# Patient Record
Sex: Female | Born: 1965 | Race: Black or African American | Hispanic: No | Marital: Single | Smoking: Never smoker
Health system: Southern US, Community
[De-identification: ages and names within clinical notes are randomized; demographics above are authoritative.]

## PROBLEM LIST (undated history)

## (undated) DIAGNOSIS — U071 COVID-19: Secondary | ICD-10-CM

## (undated) DIAGNOSIS — E785 Hyperlipidemia, unspecified: Secondary | ICD-10-CM

## (undated) DIAGNOSIS — C801 Malignant (primary) neoplasm, unspecified: Secondary | ICD-10-CM

## (undated) DIAGNOSIS — G8929 Other chronic pain: Secondary | ICD-10-CM

## (undated) DIAGNOSIS — I10 Essential (primary) hypertension: Secondary | ICD-10-CM

## (undated) DIAGNOSIS — G4733 Obstructive sleep apnea (adult) (pediatric): Secondary | ICD-10-CM

## (undated) HISTORY — PX: TUBAL LIGATION: SHX77

## (undated) HISTORY — PX: SPINE SURGERY: SHX786

## (undated) HISTORY — DX: Other chronic pain: G89.29

## (undated) HISTORY — PX: SHOULDER SURGERY: SHX246

## (undated) HISTORY — DX: Morbid (severe) obesity due to excess calories: E66.01

## (undated) HISTORY — DX: Obstructive sleep apnea (adult) (pediatric): G47.33

## (undated) HISTORY — DX: COVID-19: U07.1

## (undated) HISTORY — PX: ABDOMINAL SURGERY: SHX537

## (undated) HISTORY — DX: Hyperlipidemia, unspecified: E78.5

---

## 1997-11-27 ENCOUNTER — Emergency Department (HOSPITAL_COMMUNITY): Admission: EM | Admit: 1997-11-27 | Discharge: 1997-11-27 | Payer: Self-pay | Admitting: Emergency Medicine

## 1997-12-01 ENCOUNTER — Emergency Department (HOSPITAL_COMMUNITY): Admission: EM | Admit: 1997-12-01 | Discharge: 1997-12-01 | Payer: Self-pay | Admitting: Emergency Medicine

## 1998-07-22 ENCOUNTER — Emergency Department (HOSPITAL_COMMUNITY): Admission: EM | Admit: 1998-07-22 | Discharge: 1998-07-22 | Payer: Self-pay | Admitting: Emergency Medicine

## 1998-11-01 ENCOUNTER — Inpatient Hospital Stay (HOSPITAL_COMMUNITY): Admission: AD | Admit: 1998-11-01 | Discharge: 1998-11-01 | Payer: Self-pay | Admitting: Obstetrics

## 1998-11-05 ENCOUNTER — Inpatient Hospital Stay (HOSPITAL_COMMUNITY): Admission: AD | Admit: 1998-11-05 | Discharge: 1998-11-05 | Payer: Self-pay | Admitting: Obstetrics & Gynecology

## 1998-11-11 ENCOUNTER — Inpatient Hospital Stay (HOSPITAL_COMMUNITY): Admission: AD | Admit: 1998-11-11 | Discharge: 1998-11-12 | Payer: Self-pay | Admitting: Obstetrics

## 1999-02-04 ENCOUNTER — Ambulatory Visit (HOSPITAL_COMMUNITY): Admission: RE | Admit: 1999-02-04 | Discharge: 1999-02-04 | Payer: Self-pay | Admitting: *Deleted

## 1999-03-21 ENCOUNTER — Ambulatory Visit (HOSPITAL_COMMUNITY): Admission: RE | Admit: 1999-03-21 | Discharge: 1999-03-21 | Payer: Self-pay | Admitting: *Deleted

## 1999-05-09 ENCOUNTER — Inpatient Hospital Stay (HOSPITAL_COMMUNITY): Admission: AD | Admit: 1999-05-09 | Discharge: 1999-05-09 | Payer: Self-pay | Admitting: Obstetrics

## 1999-06-06 ENCOUNTER — Inpatient Hospital Stay (HOSPITAL_COMMUNITY): Admission: AD | Admit: 1999-06-06 | Discharge: 1999-06-06 | Payer: Self-pay | Admitting: Obstetrics

## 1999-06-08 ENCOUNTER — Inpatient Hospital Stay (HOSPITAL_COMMUNITY): Admission: AD | Admit: 1999-06-08 | Discharge: 1999-06-08 | Payer: Self-pay | Admitting: Obstetrics

## 1999-06-08 ENCOUNTER — Encounter: Payer: Self-pay | Admitting: Obstetrics

## 1999-06-09 ENCOUNTER — Inpatient Hospital Stay (HOSPITAL_COMMUNITY): Admission: AD | Admit: 1999-06-09 | Discharge: 1999-06-13 | Payer: Self-pay | Admitting: Obstetrics & Gynecology

## 1999-06-14 ENCOUNTER — Inpatient Hospital Stay (HOSPITAL_COMMUNITY): Admission: AD | Admit: 1999-06-14 | Discharge: 1999-06-18 | Payer: Self-pay | Admitting: Obstetrics & Gynecology

## 1999-06-14 ENCOUNTER — Encounter (INDEPENDENT_AMBULATORY_CARE_PROVIDER_SITE_OTHER): Payer: Self-pay

## 1999-06-20 ENCOUNTER — Inpatient Hospital Stay (HOSPITAL_COMMUNITY): Admission: EM | Admit: 1999-06-20 | Discharge: 1999-06-20 | Payer: Self-pay | Admitting: Obstetrics & Gynecology

## 1999-06-22 ENCOUNTER — Inpatient Hospital Stay (HOSPITAL_COMMUNITY): Admission: EM | Admit: 1999-06-22 | Discharge: 1999-06-22 | Payer: Self-pay | Admitting: *Deleted

## 1999-06-24 ENCOUNTER — Inpatient Hospital Stay (HOSPITAL_COMMUNITY): Admission: AD | Admit: 1999-06-24 | Discharge: 1999-06-24 | Payer: Self-pay | Admitting: *Deleted

## 1999-06-27 ENCOUNTER — Inpatient Hospital Stay (HOSPITAL_COMMUNITY): Admission: EM | Admit: 1999-06-27 | Discharge: 1999-06-27 | Payer: Self-pay | Admitting: *Deleted

## 1999-07-08 ENCOUNTER — Inpatient Hospital Stay (HOSPITAL_COMMUNITY): Admission: AD | Admit: 1999-07-08 | Discharge: 1999-07-08 | Payer: Self-pay | Admitting: *Deleted

## 2000-05-11 ENCOUNTER — Emergency Department (HOSPITAL_COMMUNITY): Admission: EM | Admit: 2000-05-11 | Discharge: 2000-05-11 | Payer: Self-pay | Admitting: Emergency Medicine

## 2000-09-02 ENCOUNTER — Emergency Department (HOSPITAL_COMMUNITY): Admission: EM | Admit: 2000-09-02 | Discharge: 2000-09-03 | Payer: Self-pay | Admitting: Emergency Medicine

## 2000-10-19 ENCOUNTER — Encounter: Payer: Self-pay | Admitting: Emergency Medicine

## 2000-10-19 ENCOUNTER — Emergency Department (HOSPITAL_COMMUNITY): Admission: EM | Admit: 2000-10-19 | Discharge: 2000-10-19 | Payer: Self-pay | Admitting: Emergency Medicine

## 2000-11-26 ENCOUNTER — Encounter: Payer: Self-pay | Admitting: Surgery

## 2000-11-26 ENCOUNTER — Ambulatory Visit (HOSPITAL_COMMUNITY): Admission: AD | Admit: 2000-11-26 | Discharge: 2000-11-27 | Payer: Self-pay | Admitting: Surgery

## 2002-01-10 ENCOUNTER — Emergency Department (HOSPITAL_COMMUNITY): Admission: EM | Admit: 2002-01-10 | Discharge: 2002-01-11 | Payer: Self-pay

## 2002-01-12 ENCOUNTER — Emergency Department (HOSPITAL_COMMUNITY): Admission: EM | Admit: 2002-01-12 | Discharge: 2002-01-12 | Payer: Self-pay | Admitting: Emergency Medicine

## 2002-01-14 ENCOUNTER — Emergency Department (HOSPITAL_COMMUNITY): Admission: EM | Admit: 2002-01-14 | Discharge: 2002-01-14 | Payer: Self-pay | Admitting: Emergency Medicine

## 2002-07-06 ENCOUNTER — Emergency Department (HOSPITAL_COMMUNITY): Admission: EM | Admit: 2002-07-06 | Discharge: 2002-07-06 | Payer: Self-pay

## 2002-07-06 ENCOUNTER — Encounter: Payer: Self-pay | Admitting: Emergency Medicine

## 2003-02-06 ENCOUNTER — Other Ambulatory Visit: Admission: RE | Admit: 2003-02-06 | Discharge: 2003-02-06 | Payer: Self-pay | Admitting: Obstetrics and Gynecology

## 2005-08-08 ENCOUNTER — Emergency Department (HOSPITAL_COMMUNITY): Admission: EM | Admit: 2005-08-08 | Discharge: 2005-08-09 | Payer: Self-pay | Admitting: Emergency Medicine

## 2005-08-08 IMAGING — CR DG CHEST 2V
2 series · 2 of 2 positions shown · non-contrast
Comparison: [DATE].

CLINICAL DATA: Syncope.  Headache.  
 CHEST - 2 VIEW:

[view not recorded (1 of 2)]
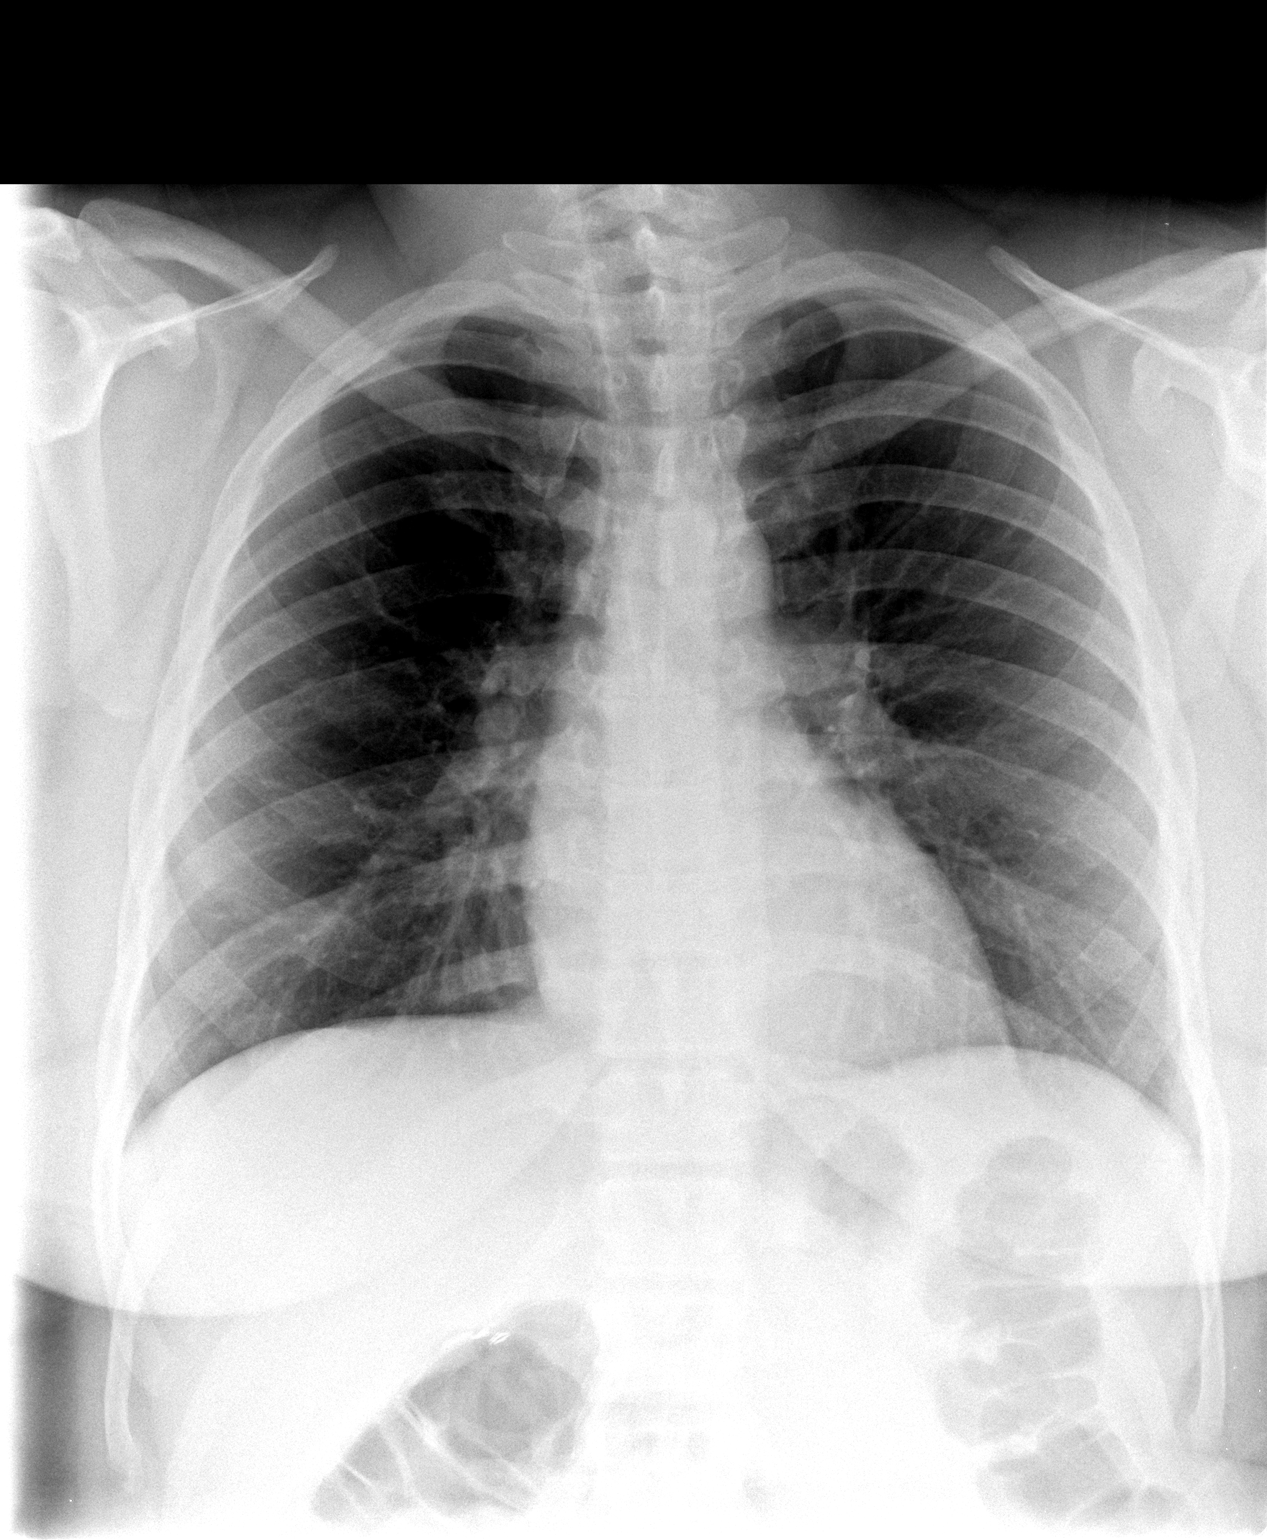

[view not recorded (2 of 2)]
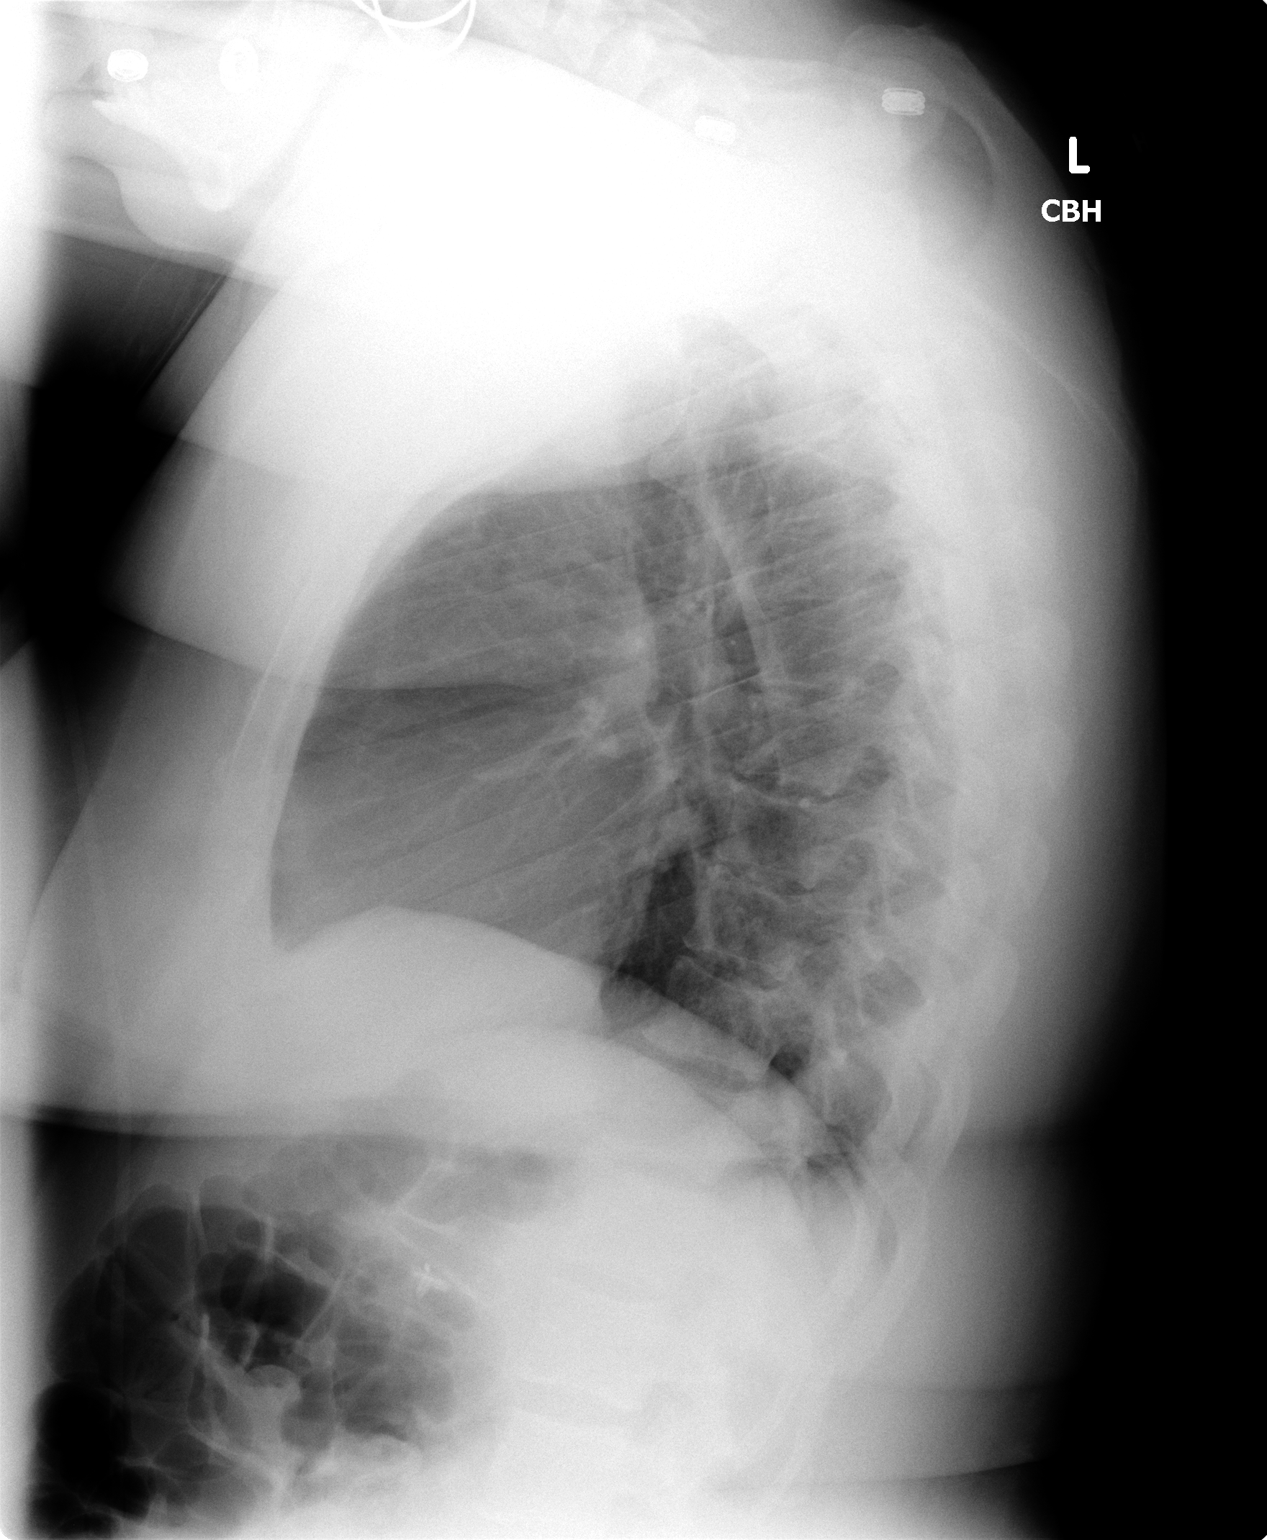

[2 of 2 positions shown; findings below may reference images not displayed]

The heart size and mediastinal contours are within normal limits.  Both lungs are clear.  The visualized skeletal structures are unremarkable.
IMPRESSION: No active cardiopulmonary disease.

## 2006-08-06 ENCOUNTER — Encounter: Admission: RE | Admit: 2006-08-06 | Discharge: 2006-08-06 | Payer: Self-pay | Admitting: Occupational Medicine

## 2006-08-06 IMAGING — CR DG ANKLE COMPLETE 3+V*L*
3 series · 3 of 3 positions shown · non-contrast
Comparison: none

CLINICAL DATA: Left ankle sprain. 
 LEFT ANKLE ? 3 VIEW:

[view not recorded (1 of 3)]
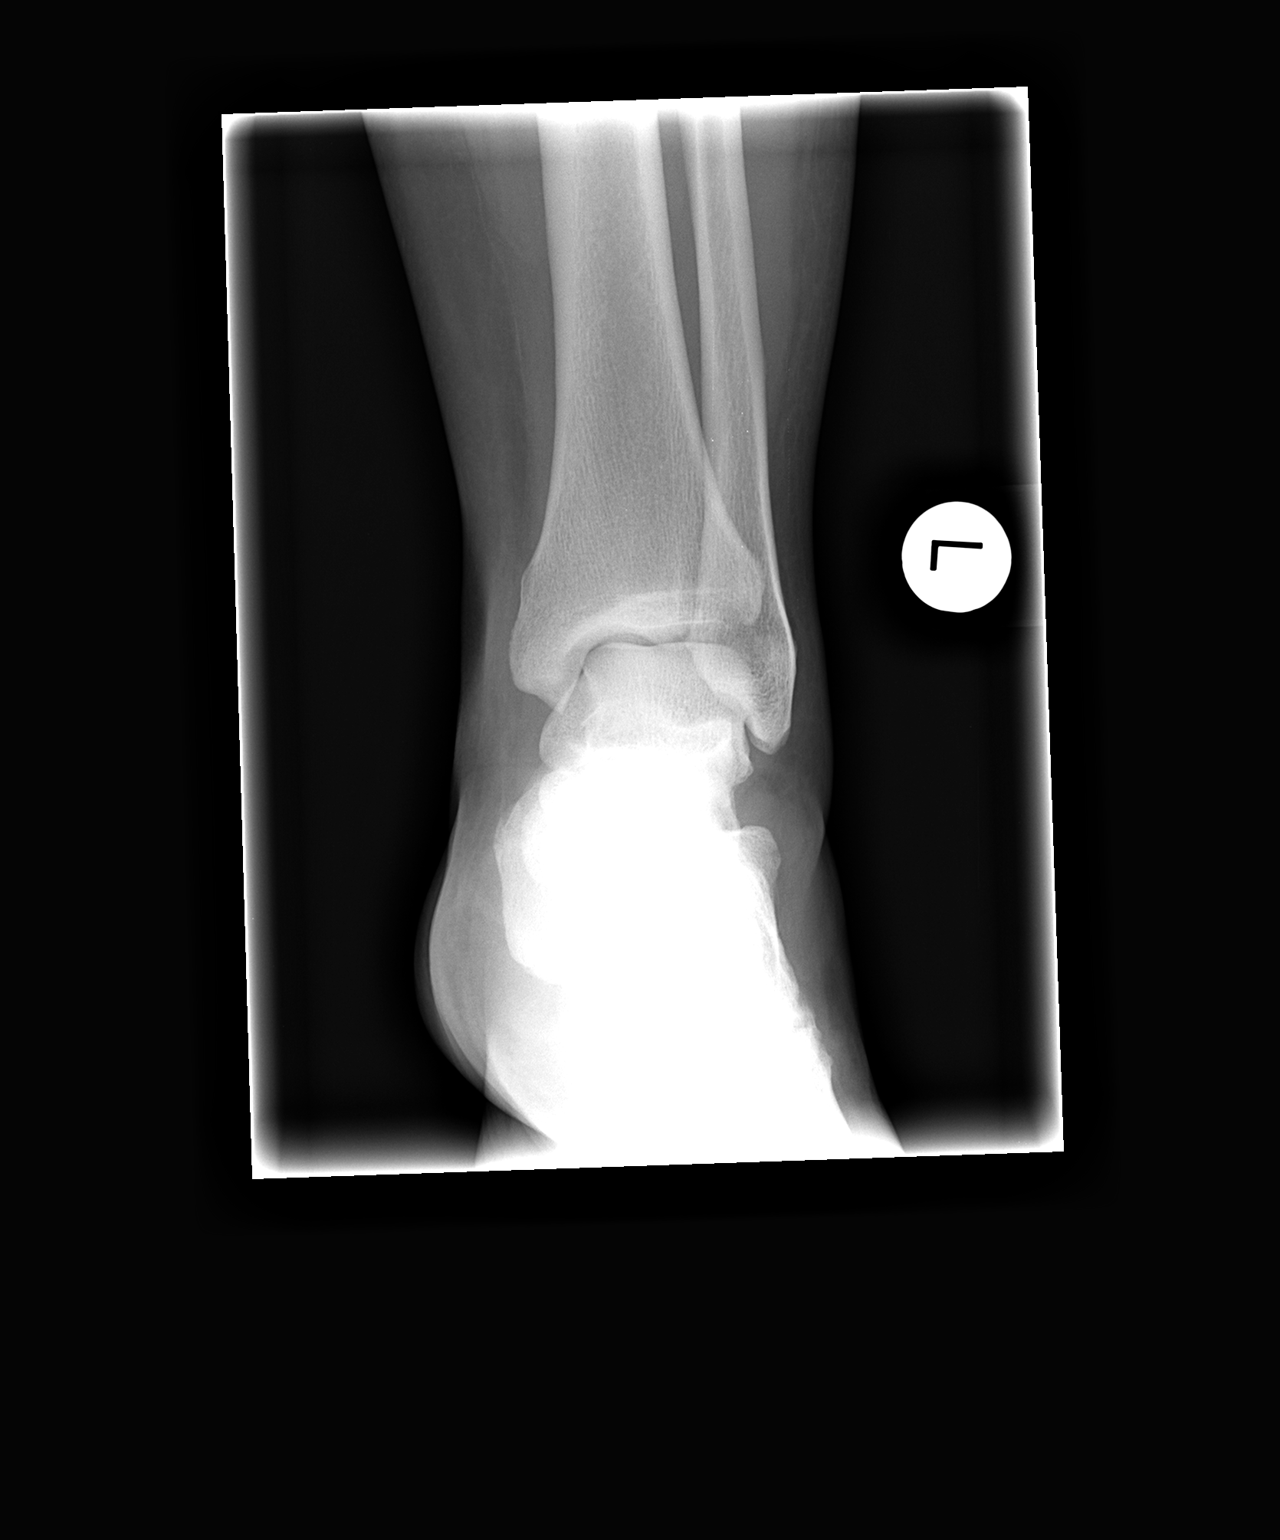

[view not recorded (2 of 3)]
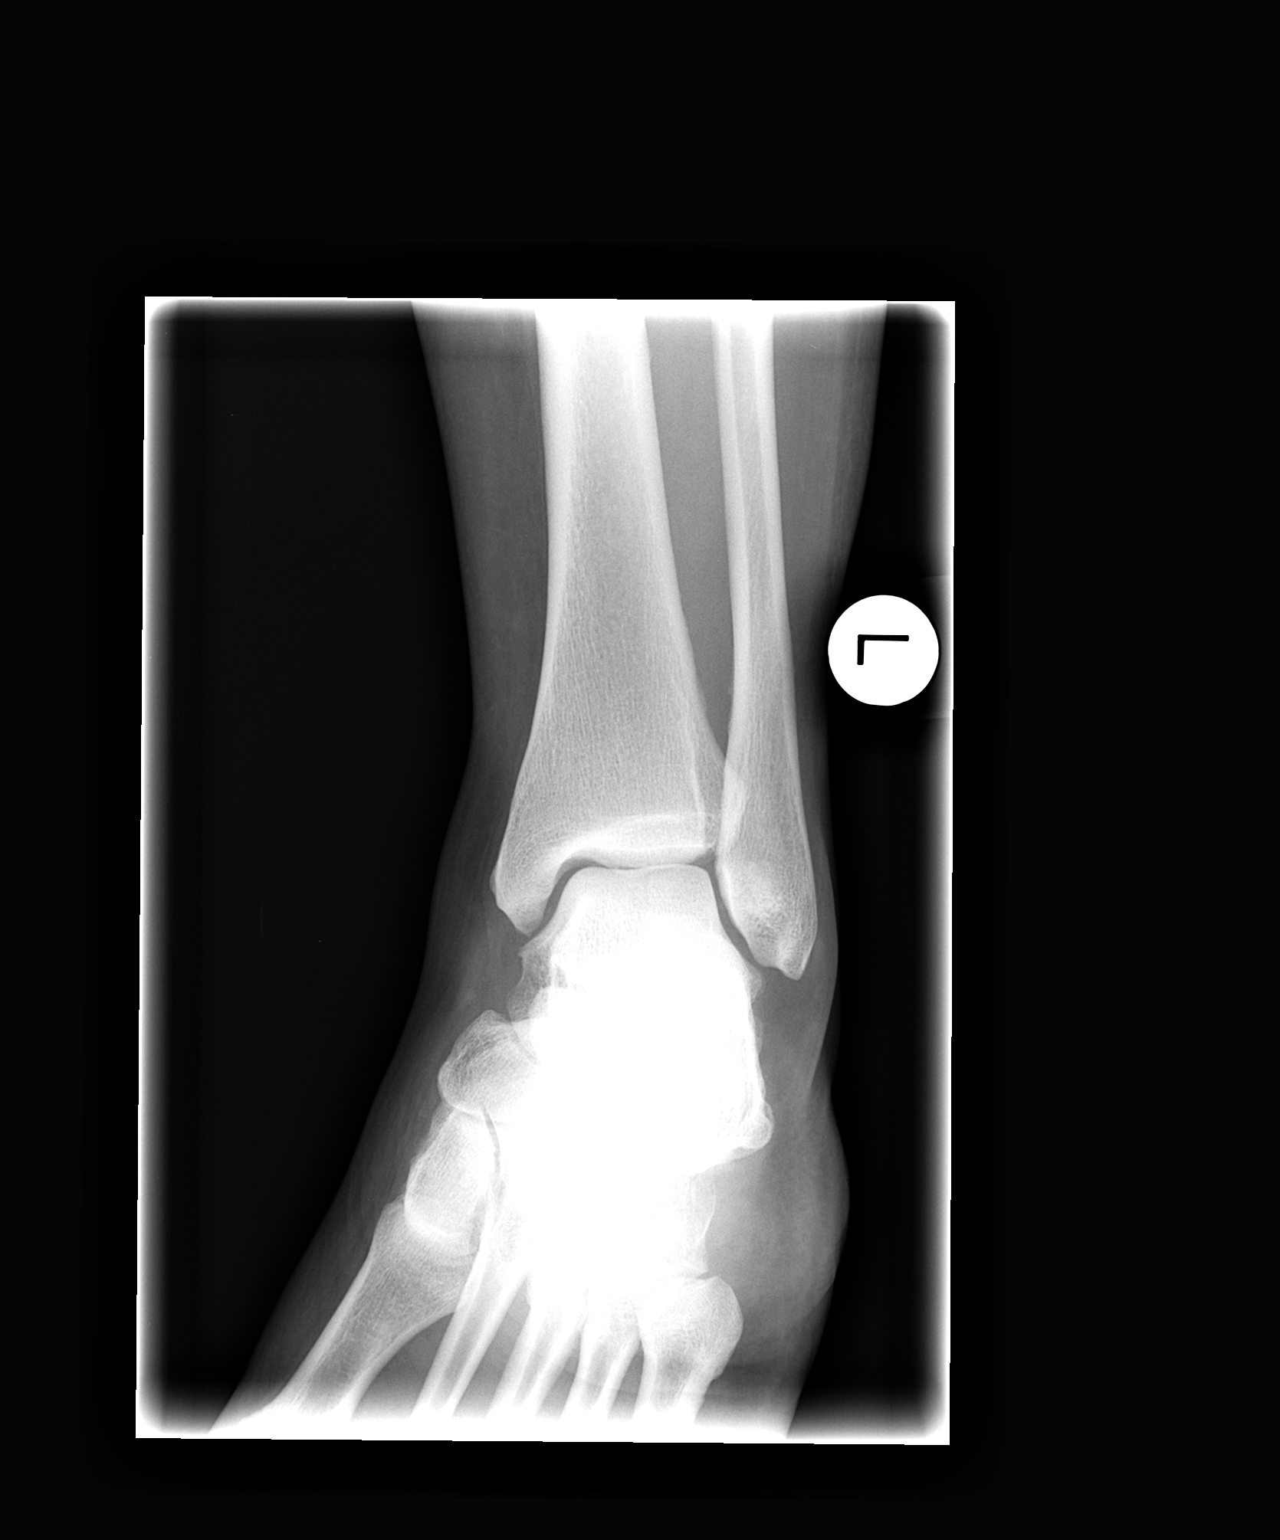

[view not recorded (3 of 3)]
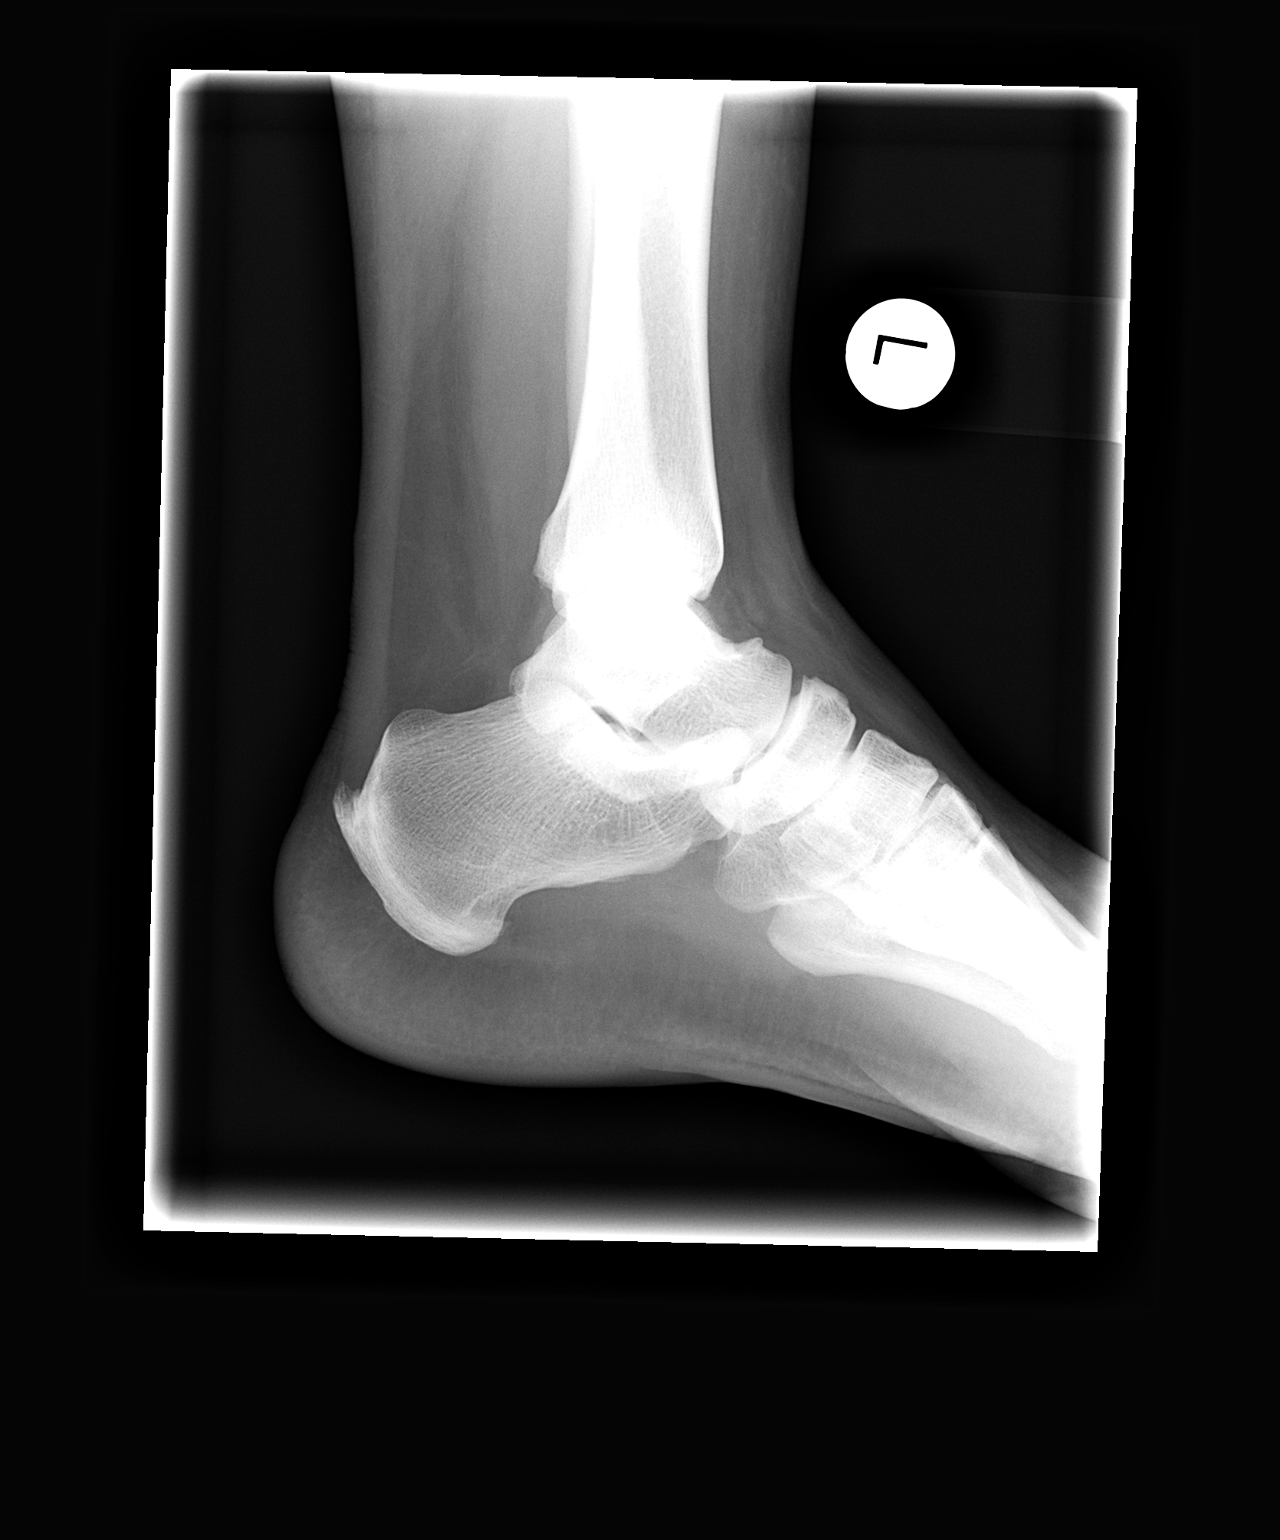

[3 of 3 positions shown; findings below may reference images not displayed]

FINDINGS: There are no fractures or dislocations.  There is mild soft tissue swelling noted.
IMPRESSION: Mild soft tissue swelling.  No acute bony injury.

## 2007-05-12 ENCOUNTER — Emergency Department (HOSPITAL_COMMUNITY): Admission: EM | Admit: 2007-05-12 | Discharge: 2007-05-12 | Payer: Self-pay | Admitting: Emergency Medicine

## 2008-01-30 ENCOUNTER — Encounter: Admission: RE | Admit: 2008-01-30 | Discharge: 2008-01-30 | Payer: Self-pay | Admitting: Internal Medicine

## 2008-01-30 IMAGING — CR DG SHOULDER 2+V*R*
3 series · 3 of 3 positions shown · non-contrast
Comparison: None

CLINICAL DATA: Right shoulder pain.  Injury several days ago.

RIGHT SHOULDER - 2+ VIEW

[view not recorded (1 of 3)]
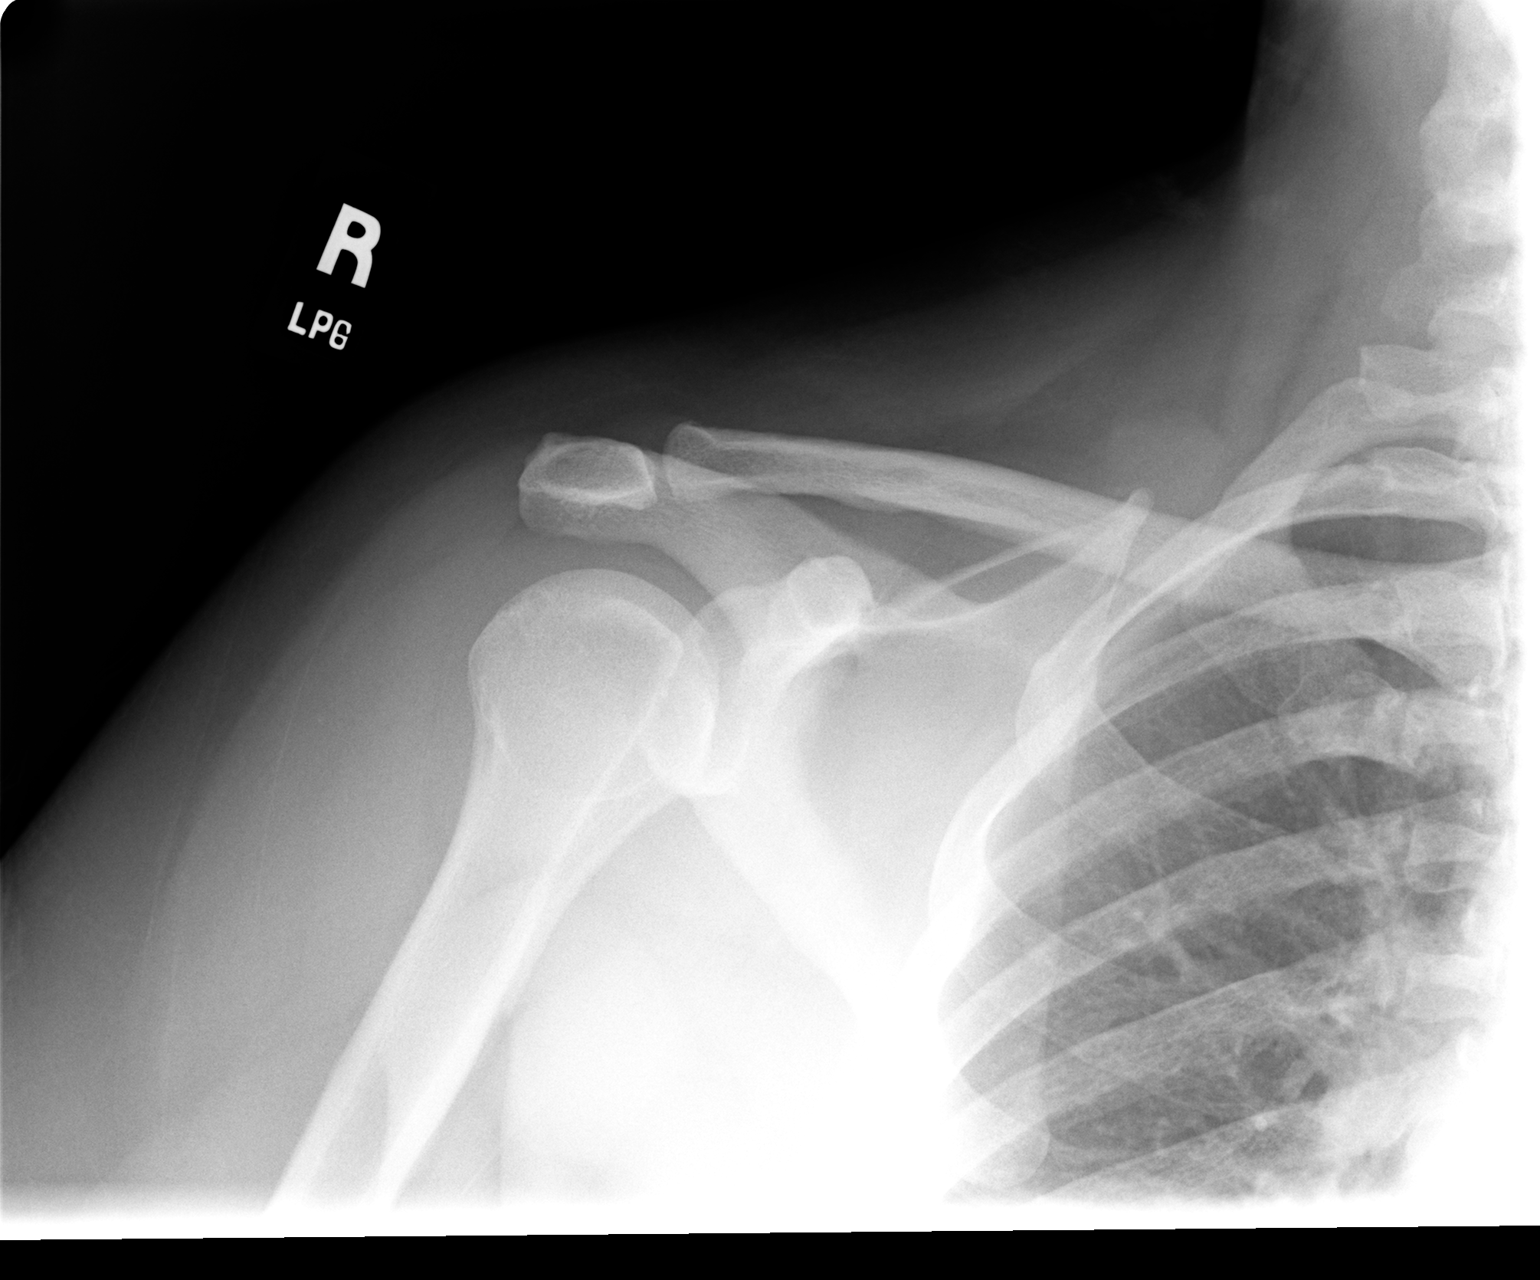

[view not recorded (2 of 3)]
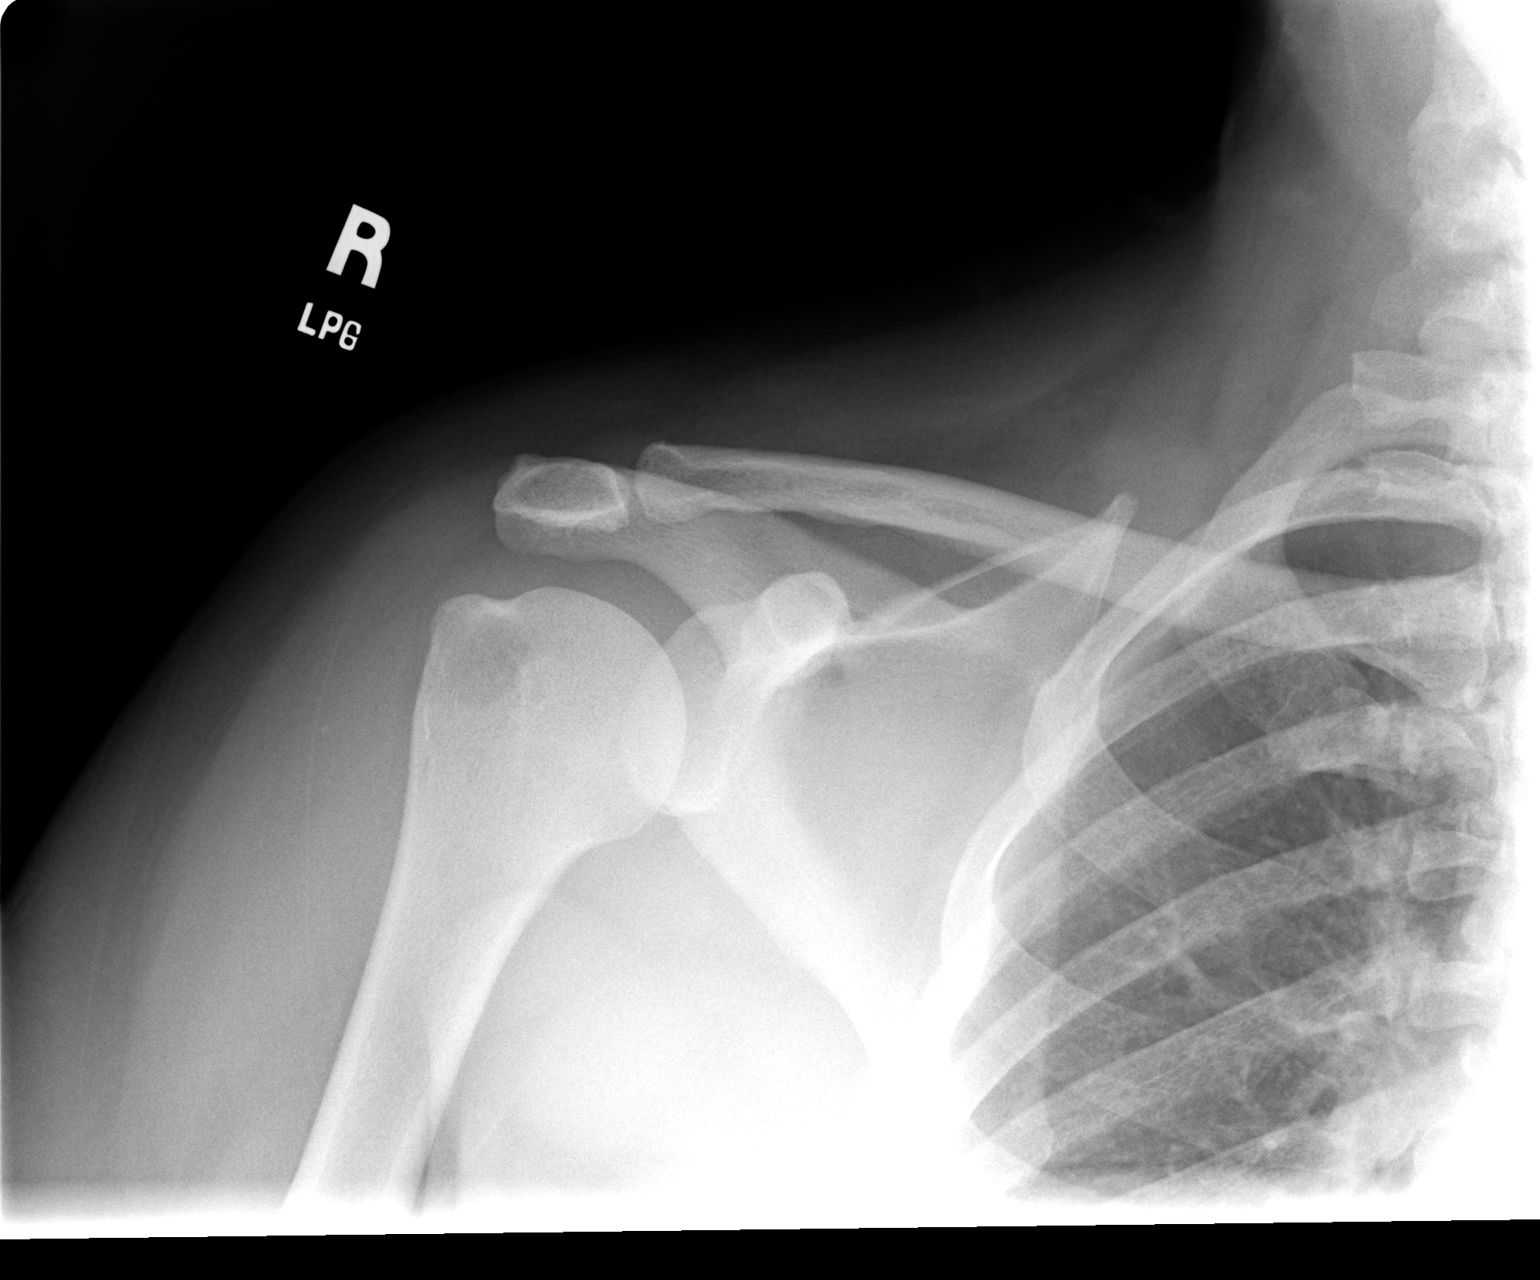

[view not recorded (3 of 3)]
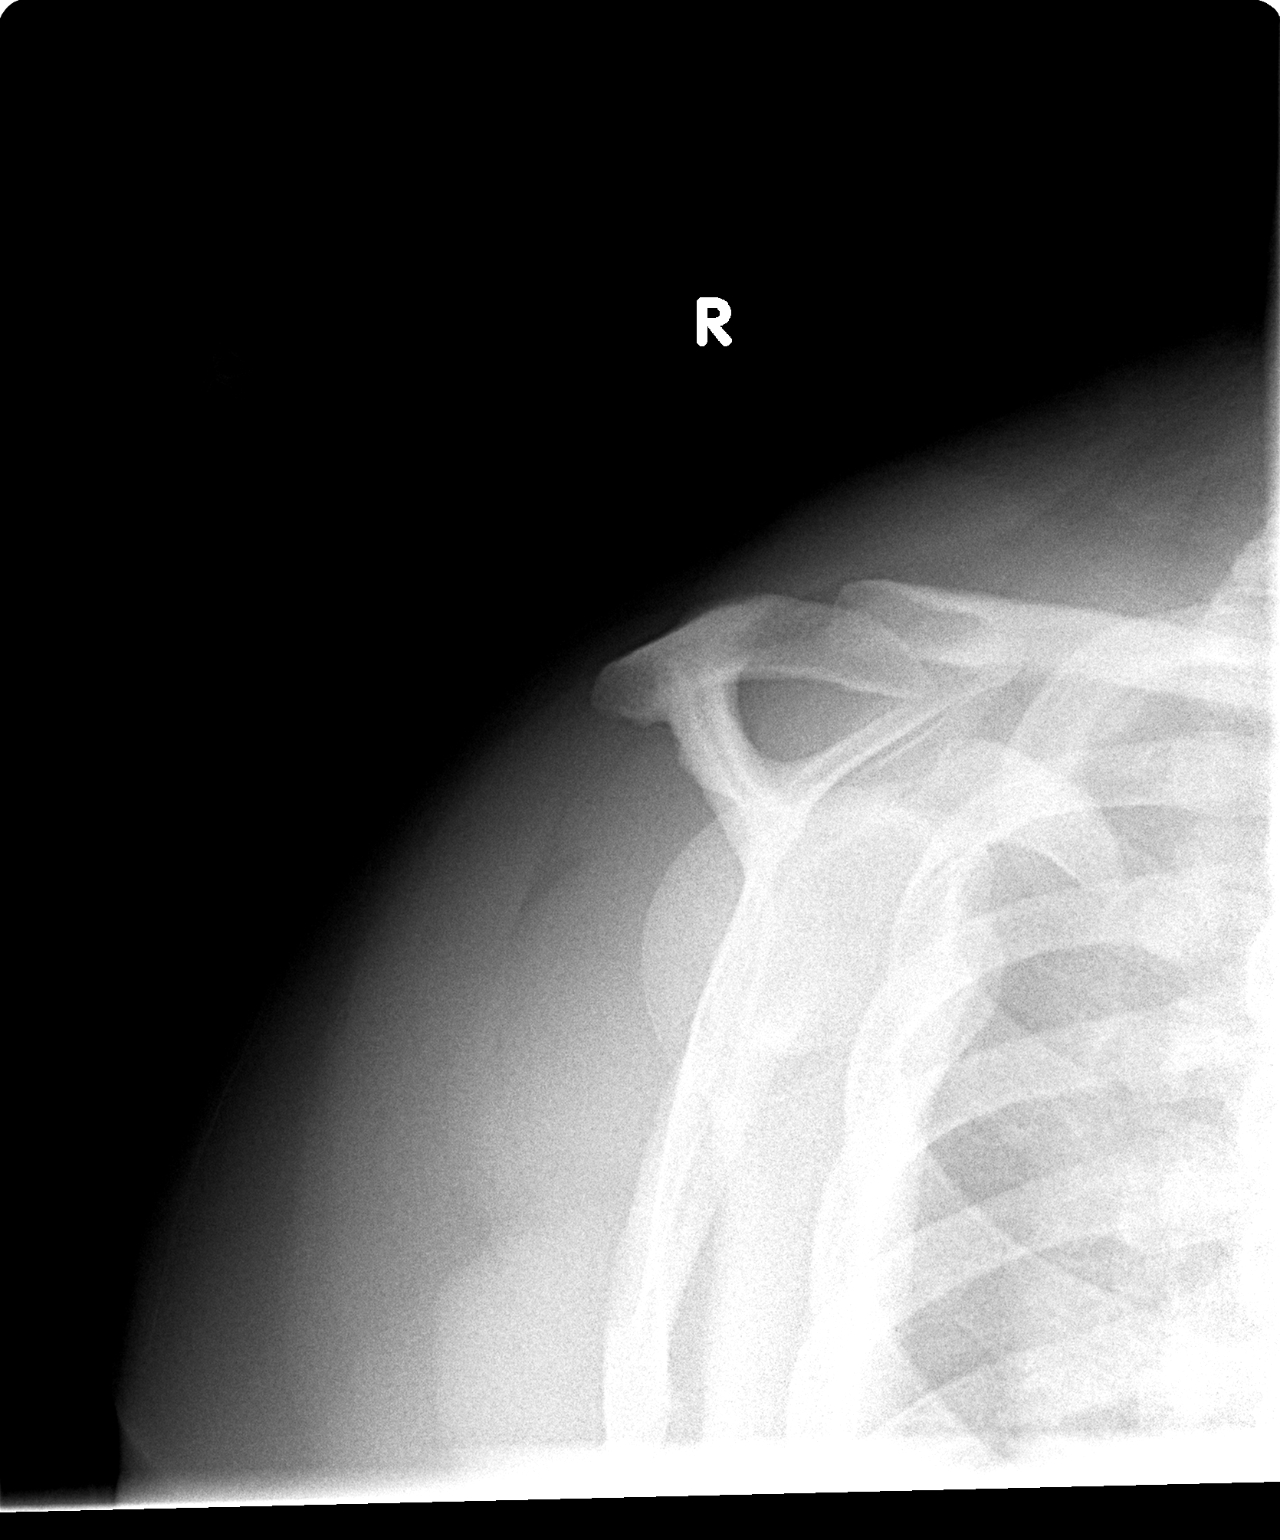

[3 of 3 positions shown; findings below may reference images not displayed]

FINDINGS: No fracture or acute bony findings noted.  No dislocation
or foreign body is evident.
IMPRESSION: 1.  No acute findings.  If pain persists despite conservative
therapy, MRI may be helpful.

## 2008-05-13 ENCOUNTER — Encounter: Admission: RE | Admit: 2008-05-13 | Discharge: 2008-05-13 | Payer: Self-pay | Admitting: Specialist

## 2008-05-13 IMAGING — CR DG CHEST 2V
2 series · 2 of 2 positions shown · non-contrast
Comparison: [DATE]

CLINICAL DATA: Preop for respiratory screening.

CHEST - 2 VIEW

[w chest pa]
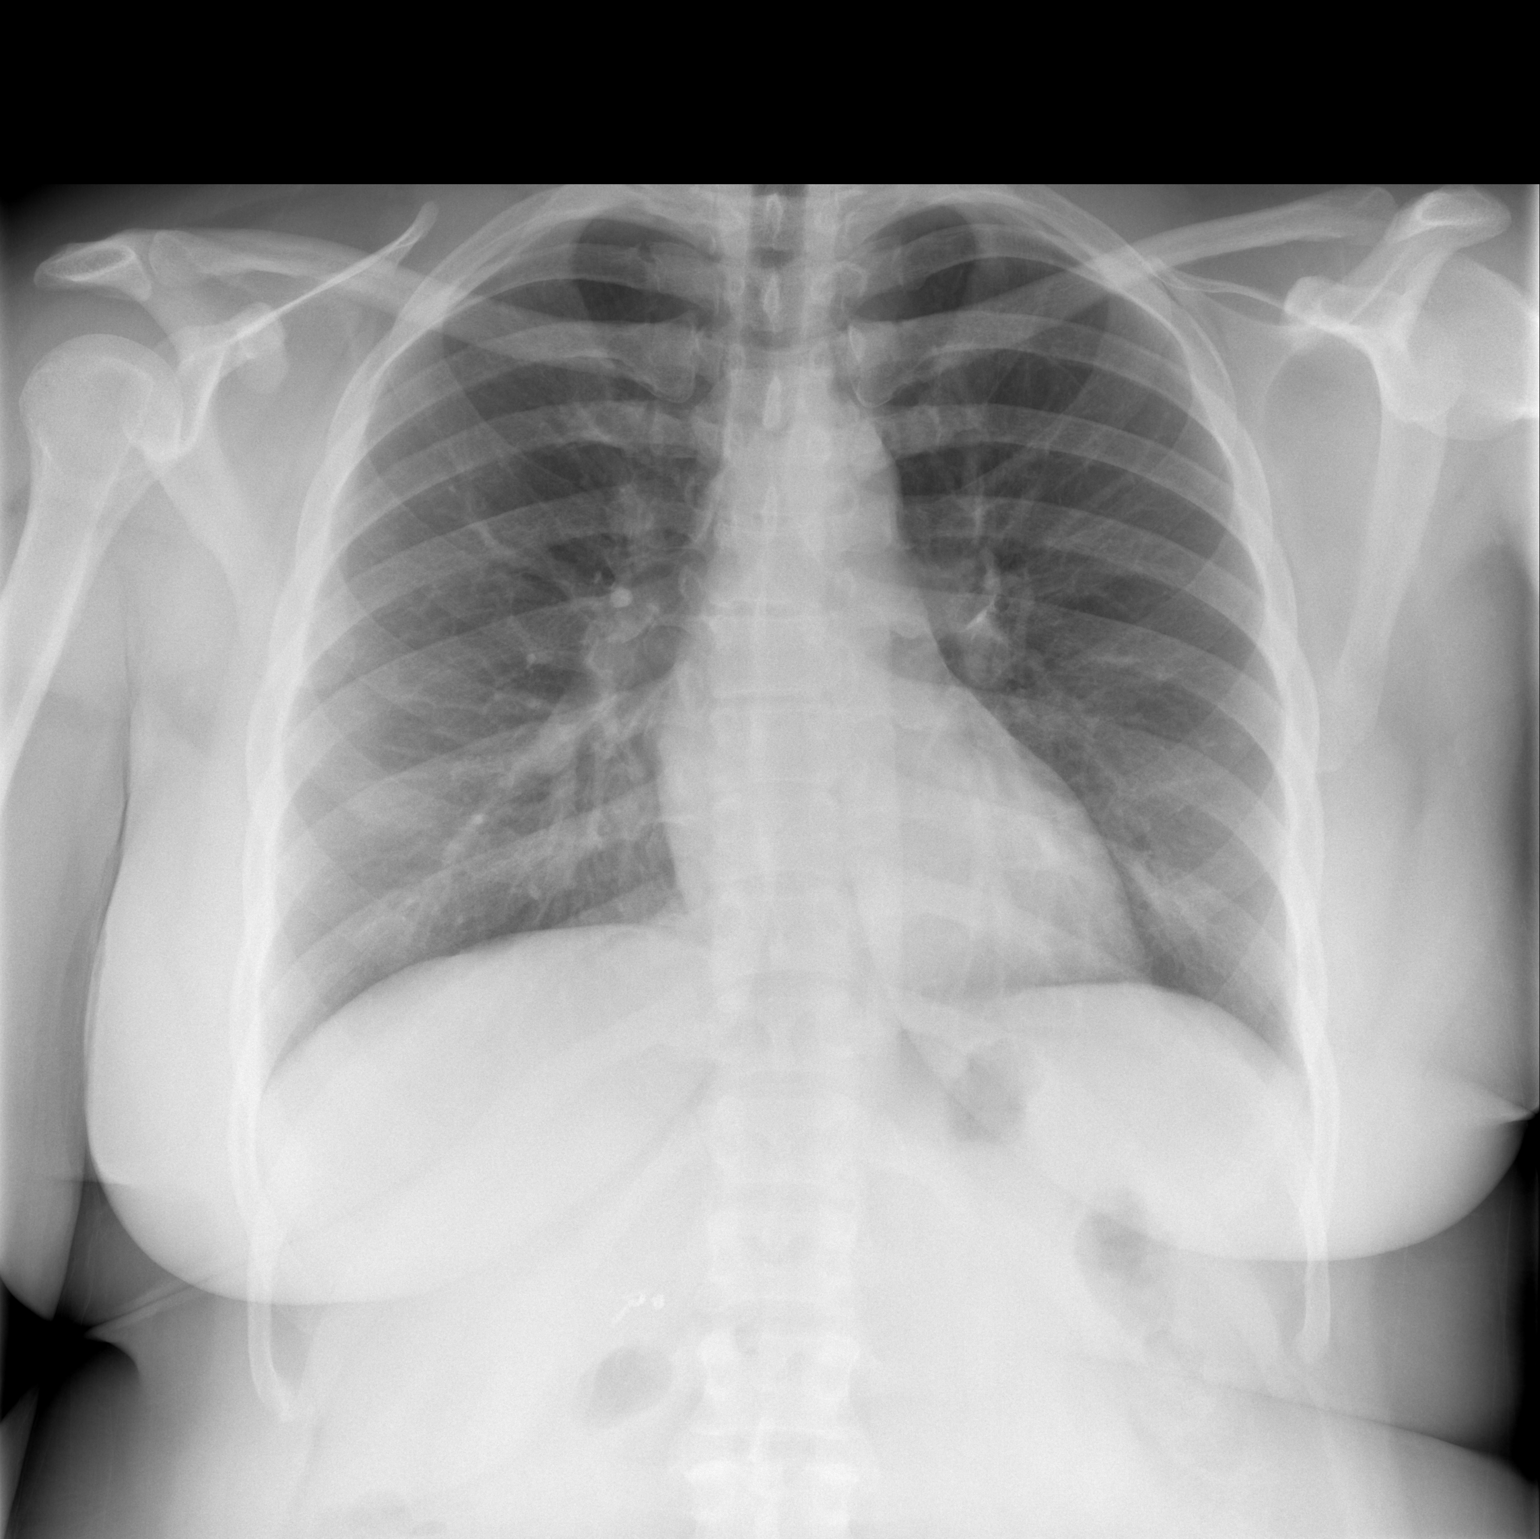

[w chest lat]
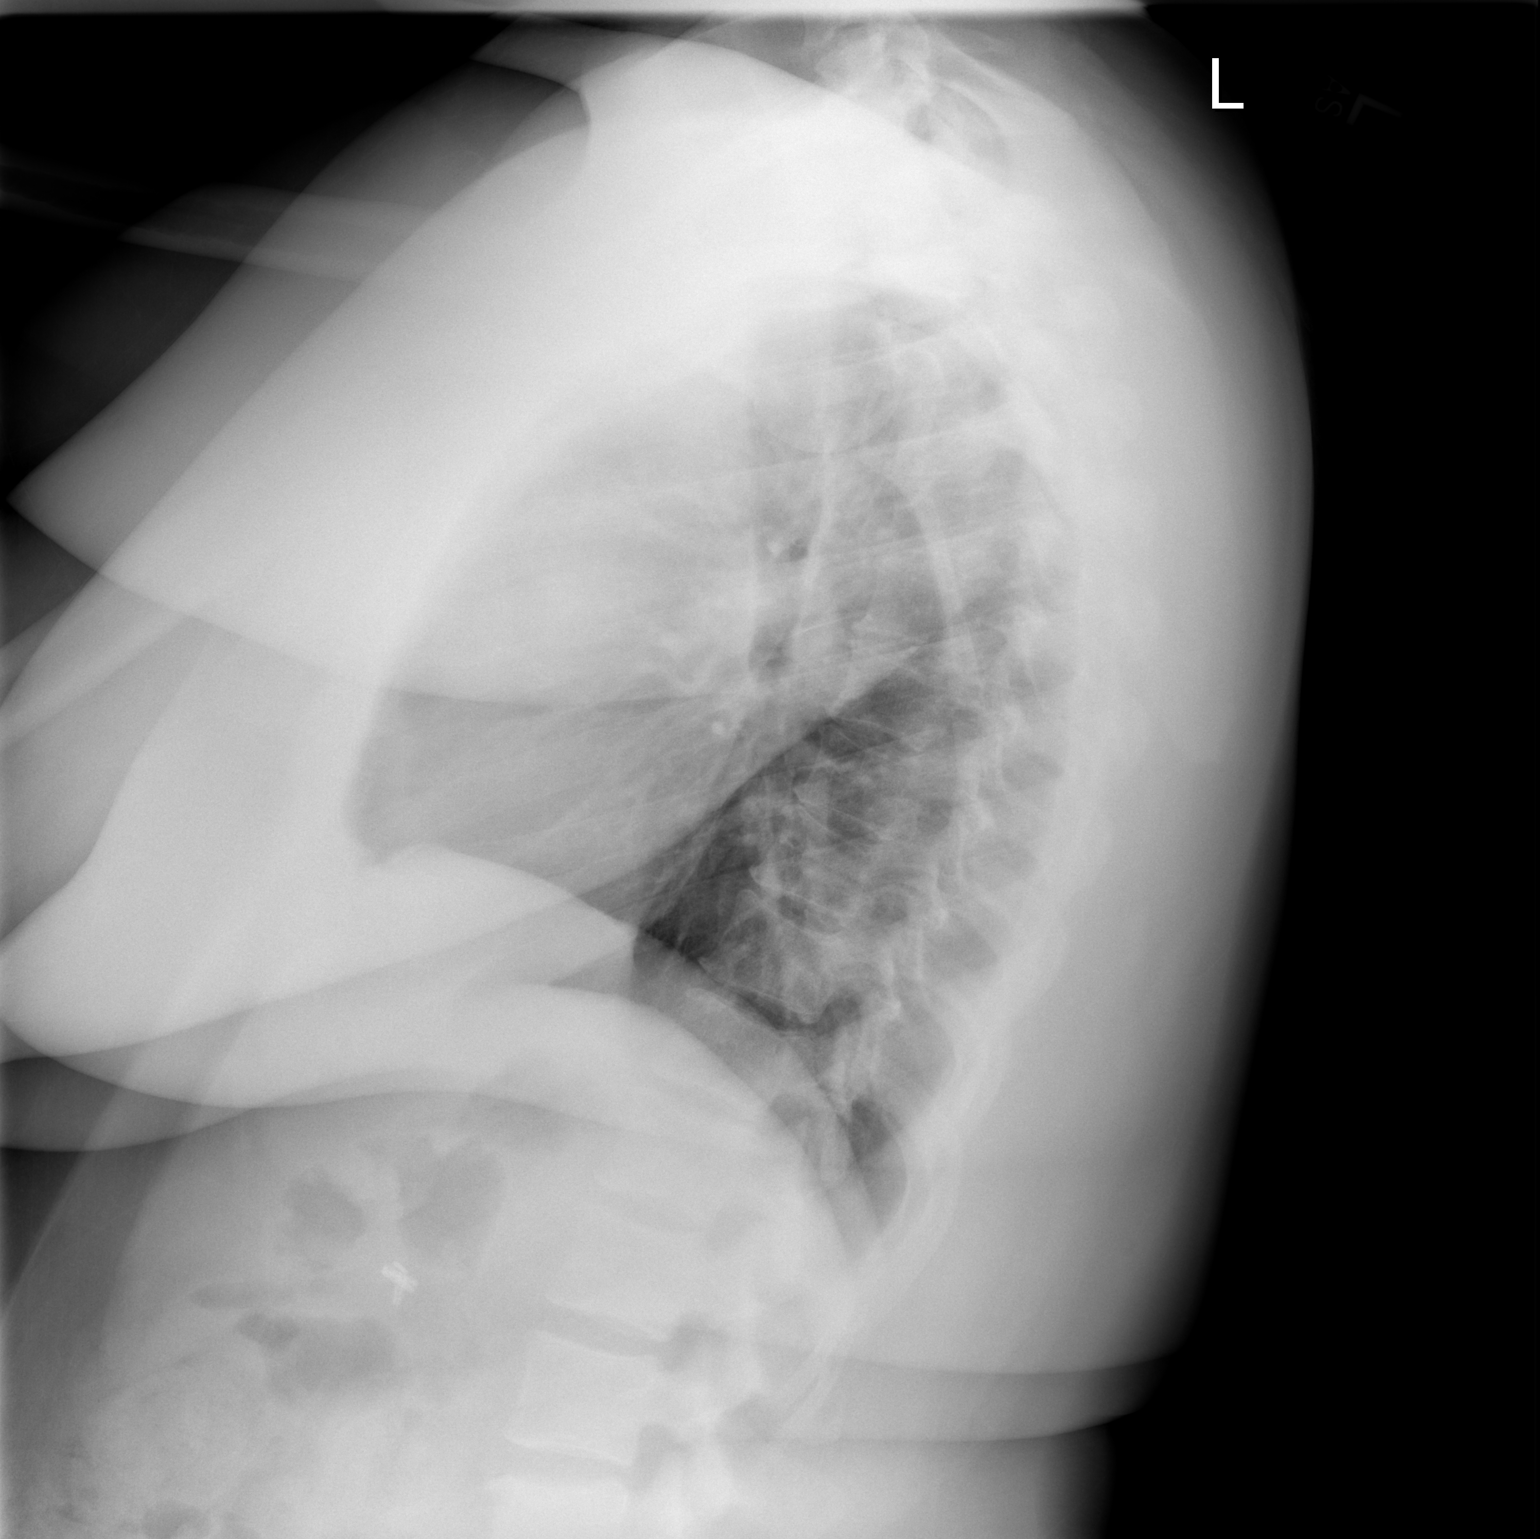

[2 of 2 positions shown; findings below may reference images not displayed]

FINDINGS: The heart size and mediastinal contours are within normal
limits.  Both lungs are clear.  The visualized skeletal structures
are unremarkable.
IMPRESSION: No active cardiopulmonary disease.

## 2008-05-14 ENCOUNTER — Ambulatory Visit (HOSPITAL_BASED_OUTPATIENT_CLINIC_OR_DEPARTMENT_OTHER): Admission: RE | Admit: 2008-05-14 | Discharge: 2008-05-15 | Payer: Self-pay | Admitting: Specialist

## 2010-08-21 ENCOUNTER — Encounter: Payer: Self-pay | Admitting: Internal Medicine

## 2010-10-31 ENCOUNTER — Inpatient Hospital Stay (INDEPENDENT_AMBULATORY_CARE_PROVIDER_SITE_OTHER)
Admission: RE | Admit: 2010-10-31 | Discharge: 2010-10-31 | Disposition: A | Discharge status: Home / Self Care | Payer: Self-pay | Source: Ambulatory Visit | Attending: Emergency Medicine | Admitting: Emergency Medicine

## 2010-10-31 DIAGNOSIS — J019 Acute sinusitis, unspecified: Secondary | ICD-10-CM

## 2010-10-31 LAB — POCT RAPID STREP A (OFFICE): Streptococcus, Group A Screen (Direct): NEGATIVE

## 2010-12-13 NOTE — Op Note (Signed)
Mallory Henderson, Mallory Henderson                 ACCOUNT NO.:  [id number]   MEDICAL RECORD NO.:  [id number]          PATIENT TYPE:  AMB   LOCATION:  [facility]                         FACILITY:  [facility]   PHYSICIAN:  Jene Every, M.D.    DATE OF BIRTH:  1966/05/26   DATE OF PROCEDURE:  DATE OF DISCHARGE:                               OPERATIVE REPORT   PREOPERATIVE DIAGNOSES:  1. Impingement syndrome.  2. Possible rotator cuff tear of the right shoulder.   POSTOPERATIVE DIAGNOSES:  1. Impingement syndrome.  2. Bursitis of the right shoulder.   PROCEDURES PERFORMED:  1. Examination under anesthesia.  2. Right shoulder arthroscopy.  3. Subacromial decompression.  4. Excision of pathologic bursa.   ANESTHESIA:  General.   ASSISTANT:  Roma Schanz, P.A.   INDICATIONS FOR PROCEDURE:  This is a 45 year old with a fractured  shoulder and pain following a work-related injury.  MRI indicating  tendinopathy.  No evidence of a tear.  She had persistent pain that was  refractory to rest, corticosteroid injection which gave her partial  relief.  She was indicated for arthroscopic evaluation of the rotator  cuff, possible repair with decompression and bursectomy.  Risks,  benefits discussed, including , bleeding, infection, injury to vascular  structures, suboptimal range of motion, no change in symptoms, worsening  of symptoms, etc.   DESCRIPTION OF PROCEDURE:  With the patient supine in beach chair  position, after induction of adequate general anesthesia and 2 g Kefzol,  the right shoulder and upper extremity was prepped and draped in the  usual sterile fashion.  We utilized the surgical marker to delineate the  acromion, AC joint, and coracoid by palpation.  She had a fairly obese  arm.  We made a small incision in the posterolateral aspect of the  acromion for the posterior portal in the arm.  In the 70 and 30 position  with gentle traction we advanced the arthroscopic cannula to the  glenohumeral joint in line with coracoid, penetrated in atraumatically  without difficulty.  A small anterior incision was then made for the  anterior portal, closer to the anterolateral aspect of the acromion.  This was advanced under direct visualization beneath the biceps tendon,  penetrating the capsule without difficulty.  We examined and probed the  glenohumeral joint.  There was no evidence of any significant arthritis.  There was normal glenoid and normal humerus.  Normal subscap and biceps  tendon, and biceps tendon attachment.  We examined the rotator cuff.  There was no evidence of any significant tear into the rotator cuff  noted.  It was probed and evaluated.  We introduced the shaver and just  lavaged the glenohumeral joint.  No loose debris was noted.  After full  inspection, in the absence of any significant pathology, we redirected  the arthroscopic camera into the subacromial space and utilized a  lateral portal fashioned through the skin only with the 11 blade,  introduced a cannula, triangulating.  Noted was hypertrophic and  exuberant bursa and synovitis.  We therefore introduced a shaver and  performed a full bursectomy  anteriorly, posteriorly, and on top of the  rotator cuff.  Following the full bursectomy and exposure of the rotator  cuff, the rotator cuff was fully inspected, probed, and palpated.  There  was no evidence of a tear of the rotator cuff noted.  There was ample  subacromial space to see the ligament was intact.  There was no spurring  of the anterolateral aspect of the acromion.  This was with and without  traction, and therefore we felt that no release of the CA ligament would  be required.  Again, full bursectomy was performed, and thorough  inspection and probe palpation of the rotator cuff revealed no evidence  of tear.  Next we then removed all instrumentation after lavaging the  subacromial joint, and closed the portals with 4-0 nylon simple  sutures;  0.25% Marcaine with epinephrine was infiltrated in the joint.  The wound  was dressed sterilely.  She was placed in a sling, extubated without  difficulty and transported to the recovery room in satisfactory  condition.   The patient tolerated the procedure with no complications.      Jene Every, M.D.  Electronically Signed     JB/MEDQ  D:  05/14/2008  T:  05/14/2008  Job:  [id number]

## 2010-12-16 NOTE — Consult Note (Signed)
[place]  Patient:    Mallory Henderson, Mallory Henderson                          MRN: [id number] Proc. Date: 10/19/00 Adm. Date:  10/19/00 Attending:  Sandria Bales. Ezzard Standing, M.D.                          Consultation Report  DATE OF BIRTH:  [birth year].  BRIEF HISTORY:  [name] is 45 year old black female who is a patient of the [facility] at [facility].  She has no identifying primary medical doctor.  Approximately 2 days ago she developed nausea and vomiting with increasing abdominal pain.  She came in some time last night or this morning to the [facility].  A GI cocktail really did not help her pain much.  She denies any known history of peptic ulcer disease, liver disease, gallbladder disease, pancreatic disease, or colon disease.  Her only prior abdominal operations was a laparoscopy about 7 years ago for a questionable tubal pregnancy and then she had a C-section with her last child born 14 months ago.  PAST MEDICAL HISTORY:  She has no allergies.  She is on no current medications.  REVIEW OF SYSTEMS:  Pulmonary:  Does not smoke cigarettes.  There is no history of pneumonia or tuberculosis.  Cardiac:  No evidence of heart disease, chest pain, or hypertension.  Gastrointestinal:  See history of present illness.  Urologic:  No history of kidney stones or kidney infections.  GYN: She is a gravida 6, para 4.  She has children ages, 66, 68, 96 and 35 months. Her last period was October 01, 2000.  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  She does not identify any institution she seems to work at the most.  PHYSICAL EXAMINATION:  VITAL SIGNS:  Her temperature is 98, pulse 104, blood pressure 129/82. Respirations 20.  GENERAL:  She is a well-nourished black female, mildly obese.  Alert and cooperative on physical examination.  HEENT:   Unremarkable.  NECK:  Supple without mass or thyromegaly or  lymphadenopathy.  LUNGS:  Clear to auscultation.  HEART:  Regular rate and rhythm without murmur or rub.  BREASTS:  I did not examine her breasts.  ABDOMEN:  Soft, doughy abdomen with no tenderness, no guarding, no rebound. She has an infraumbilical incision which is well-healed with a scar and she has her Pfannenstiel incision from her C-section.  EXTREMITIES:  Shows good strength in all four extremities.  NEUROLOGIC:  Grossly intact.  LABORATORY DATA:  Urinalysis was unremarkable.  Her white blood count is 5,600.  Hemoglobin 11.5, hematocrit 33.8.  Amylase 102, lipase is 20, ultrasound reveals multiple gallstones with no evidence of acute cholecystitis.  IMPRESSION: 1. Biliary colic which is now resolved.  The patient would be best scheduled    for elective gallbladder surgery.  I have discussed with her the    indications, complications of surgery, including but not limited to    bleeding, infection, bowel duct injury, and possible open surgery.  She    will go to our office today and get scheduled within the next week or    two for surgery. 2. Moderate obesity. DD:  10/19/00 TD:  10/19/00 Job: [id number]

## 2010-12-16 NOTE — Op Note (Signed)
[place]  Patient:    Mallory Henderson, Mallory Henderson                          MRN: [id number] Proc. Date: 11/26/00 Adm. Date:  [id number] Attending:  Andre Lefort                           Operative Report  DATE OF BIRTH:  1966/01/09.  PREOPERATIVE DIAGNOSIS:  Chronic cholecystitis with cholelithiasis.  POSTOPERATIVE DIAGNOSIS:  Chronic cholecystitis with cholelithiasis.  PROCEDURE:  Laparoscopic cholecystectomy with intraoperative cholangiogram.  SURGEON:  Christopher E. Ezzard Standing, M.D.  FIRST ASSISTANT:  Gita Kudo, M.D.  ANESTHESIA:  General endotracheal.  ESTIMATED BLOOD LOSS:  Minimal.  INDICATION FOR PROCEDURE:  Mallory Henderson is a 45 year old black female who is a patient of the [facility]. She presented to the emergency room on October 19, 2000, with symptomatic cholelithiasis.  _____ for laparoscopic cholecystectomy.  DESCRIPTION OF PROCEDURE:  Patient placed in a supine position, given a general endotracheal anesthetic.  She had 1 g of Ancef at the initiation of the procedure, had PAS stockings in place and an oral gastric tube and underwent general endotracheal anesthesia per Cliffton Asters. Ivin Booty, M.D.  Her abdomen was prepped with Betadine soap and solution and sterilely draped. An infraumbilical incision was made with sharp dissection and carried down to the abdominal cavity.  The 0 degree laparoscope was inserted through a 12 mm Hasson trocar, and the Hasson trocar was secured with a 0 Vicryl suture.  Both lobes of the liver were unremarkable.  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  Her stomach was unremarkable.  She had some adhesions to her lower abdominal wall with omentum.  There was no other nodularity or mass.  Three additional trocars were placed, a 10 mm Ethicon  trocar in the subxiphoid location, a 5 mm Ethicon trocar in the right midsubcostal location, and a 5 mm Ethicon trocar in the right lateral subcostal location.  The gallbladder was grasped, rotated cephalad.  Dissection was carried out along the gallbladder-cystic duct junction.  A clip was on the gallbladder side of the cystic duct and intraoperative cholangiogram obtained.  Intraoperative cholangiogram was obtained using a cut-off Taut catheter inserted through a 14-gauge Jelco catheter, and then the cystic duct was cut. The 14-gauge Jelco was placed through the cystic duct and secured with an endoclip.  Half-strength Hypaque solution was then injected under direct fluoroscopy, showing free flow of contrast through the cystic duct, into the common bile duct, into the duodenum.  There was no mass nor obstruction.  This was felt to be a normal intraoperative cholangiogram.  The Taut catheter was then removed, the cystic duct triply endoclipped and divided.  The cystic artery was identified immediately behind it and triply endoclipped and divided.  The gallbladder was then sharply and bluntly dissected from the gallbladder bed.  There was a posterior branch of the gallbladder artery which was identified, and this was doubly clipped.  Prior to complete division of the gallbladder from the gallbladder bed, the gallbladder bed and triangle of Calot were visualized.  There was no bleeding and no bile leak.  The gallbladder was divided from the liver, delivered through the umbilicus intact.  The  patient had at least two or three large, probably 1.2-1.4 cm, stones.  The abdomen was then irrigated out and the umbilical incision closed with a 0 Vicryl suture.  Each port site had been infiltrated with 0.25% Marcaine.  The skin at each site was closed with a 5-0 Vicryl suture, painted with tincture of benzoin and Steri-Strips, and sterilely dressed.  The patient tolerated the procedure well, was  transported to the recovery room in good condition.  Sponge and needle count were correct at the end of the case. DD:  11/26/00 TD:  11/26/00 Job: [id number]

## 2010-12-16 NOTE — Op Note (Signed)
[facility]  Patient:    Mallory Henderson                           MRN: [id number] Proc. Date: 06/15/99 Adm. Date:  [id number] Attending:  Michaelle Copas                           Operative Report  PREOPERATIVE DIAGNOSIS:           1. A 38-week intrauterine pregnancy.                                   2. Mild pregnancy-induced hypertension.                                   3. 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                                   4. Suspicious fetal heart tracing with decreased                                      long-term variability and late decelerations.  POSTOPERATIVE DIAGNOSIS:          1. A 38-week intrauterine pregnancy.                                   2. Mild pregnancy-induced hypertension.                                   3. 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                                   4. Suspicious fetal heart tracing with decreased                                      long-term variability and late decelerations.  OPERATION:                        Primary low transverse cesarean section and modified bilateral Pomeroy tubal ligation via Pfannenstiel.  SURGEON:                          Roseanna Rainbow, M.D.  ANESTHESIA:  Spinal.  COMPLICATIONS:                    None.  ESTIMATED BLOOD LOSS:             800 cc.  FLUIDS:                           300 cc lactated ringers.  Urine output 300 cc  clear urine.  INDICATIONS:                     The patient is multipara at 38 weeks with mild  pregnancy-induced hypertension undergoing induction secondary to elevated anti-U and anti-E antibodies.  Maximal dilatation was 2 cm.  The fetal heart tracing was remarkable for decreased long-term variability and late decelerations.  FINDINGS:                         Female  infant in cephalic presentation. Pediatrics were present at delivery.  Apgars were 88.  Normal uterus, tubes, and ovaries.  DESCRIPTION OF PROCEDURE:         The patient was taken to the operating room where a spinal anesthetic was administered and found to be adequate.  She was then prepped and draped in the normal sterile fashion in dorsosupine position with a  leftward tilt.  A Pfannenstiel skin incision was made with the scalpel and carried through to the underlying layer fascia.  The fascia was nicked at the midline and the incision extended laterally with Mayo scissors.  The superior aspect of the  fascial incision was then grasped with Kocher clamps, elevated and the underlying rectus muscle dissected off bluntly.  Attention was then turned to the inferior aspect of the incision which was manipulated in a similar fashion.  The rectus muscles were separated in the midline and the parietal peritoneum identified, tilted up, and entered sharply with Metzenbaum scissors.  The peritoneal incision was then extended superiorly and inferiorly with good visualization of the bladder.  The bladder blade was inserted, and the vesicouterine peritoneum identified, grasped with pick ups, and entered  sharply with Metzenbaum scissors.  This incision was then  extended laterally and the bladder flap created digitally.  The bladder blade was then reinserted in the lower uterine segment and incised in a transverse fashion with a scalpel.  The uterine incision was then extended laterally with bandage scissors.  The bladder blade was removed, and the infants head was delivered atraumatically.  The nose  and mouth were suctioned with the bulb suction and the cord clamped and cut. The infant was handed off to the awaiting pediatricians.  Cord gases were sent. The placenta was then removed, and the uterus exteriorized and cleared of all clots and debris.  The uterine incision was repaired  with 0 Monocryl in a running locked fashion.  Excellent hemostasis was noted.  Attention was then turned to the fallopian tubes.  The patients left fallopian tube was then identified, grasped with the Babcock clamp approximately 4 cm from the cornual region.  A 3 cm segment of tube was then doubly ligated with free ties of plain gut and excised.  Good hemostasis was noted.  The right fallopian tube was manipulated in a similar fashion.  The uterus was then returned to the abdomen.  The gutters were cleared of all clots.  The fascia was reapproximated with 0 Vicryl in a running fashion.  The skin was closed with staples.  The patient tolerated the procedure well.  Sponge, lap, and needle counts were correct x 2.  The patient as taken to the PACU in stable condition.  DD:  06/20/99 TD:  06/22/99 Job: [id number]

## 2010-12-16 NOTE — Op Note (Signed)
[facility]  Patient:    Mallory Henderson                           MRN: [id number] Proc. Date: 06/15/99 Adm. Date:  [id number] Attending:  Michaelle Copas                           Operative Report  PREOPERATIVE DIAGNOSIS:           A 38-week intrauterine pregnancy, anti-U and anti-E antibodies, rule out isoimmunization, suspicious fetal heart tracing with decreased long-term variability and late decelerations.  POSTOPERATIVE DIAGNOSIS:          A 38-week intrauterine pregnancy, anti-U and anti-E antibodies, rule out isoimmunization, suspicious fetal heart tracing with decreased long-term variability and late decelerations.  OPERATION:                        Primary low transverse cesarean section and bilateral tubal ligation via Pfannenstiel.  SURGEON:                          Roseanna Rainbow, M.D.  ASSISTANT:  ANESTHESIA:  ESTIMATED BLOOD LOSS:  INDICATIONS:  DESCRIPTION OF PROCEDURE: DD:  06/20/99 TD:  06/22/99 Job: [id number]

## 2011-05-02 LAB — CBC
MCHC: 33.2
MCV: 83.6
RBC: 4.09
RDW: 14
WBC: 6.3

## 2011-05-02 LAB — BASIC METABOLIC PANEL
BUN: 7
Calcium: 9
Chloride: 101
GFR calc Af Amer: >60
Potassium: 3.7

## 2011-05-09 ENCOUNTER — Emergency Department (HOSPITAL_COMMUNITY)
Admission: EM | Admit: 2011-05-09 | Discharge: 2011-05-09 | Disposition: A | Discharge status: Home / Self Care | Payer: Self-pay | Attending: Emergency Medicine | Admitting: Emergency Medicine

## 2011-05-09 ENCOUNTER — Emergency Department (HOSPITAL_COMMUNITY): Payer: Self-pay

## 2011-05-09 DIAGNOSIS — R059 Cough, unspecified: Secondary | ICD-10-CM | POA: Insufficient documentation

## 2011-05-09 DIAGNOSIS — R05 Cough: Secondary | ICD-10-CM | POA: Insufficient documentation

## 2011-05-09 DIAGNOSIS — R0602 Shortness of breath: Secondary | ICD-10-CM | POA: Insufficient documentation

## 2011-05-09 DIAGNOSIS — J40 Bronchitis, not specified as acute or chronic: Secondary | ICD-10-CM | POA: Insufficient documentation

## 2011-05-09 IMAGING — CR DG CHEST 2V
2 series · 2 of 2 positions shown · non-contrast
Comparison: [DATE]

CLINICAL DATA: 45 year-old female with shortness of breath.

CHEST - 2 VIEW

[w chest pa]
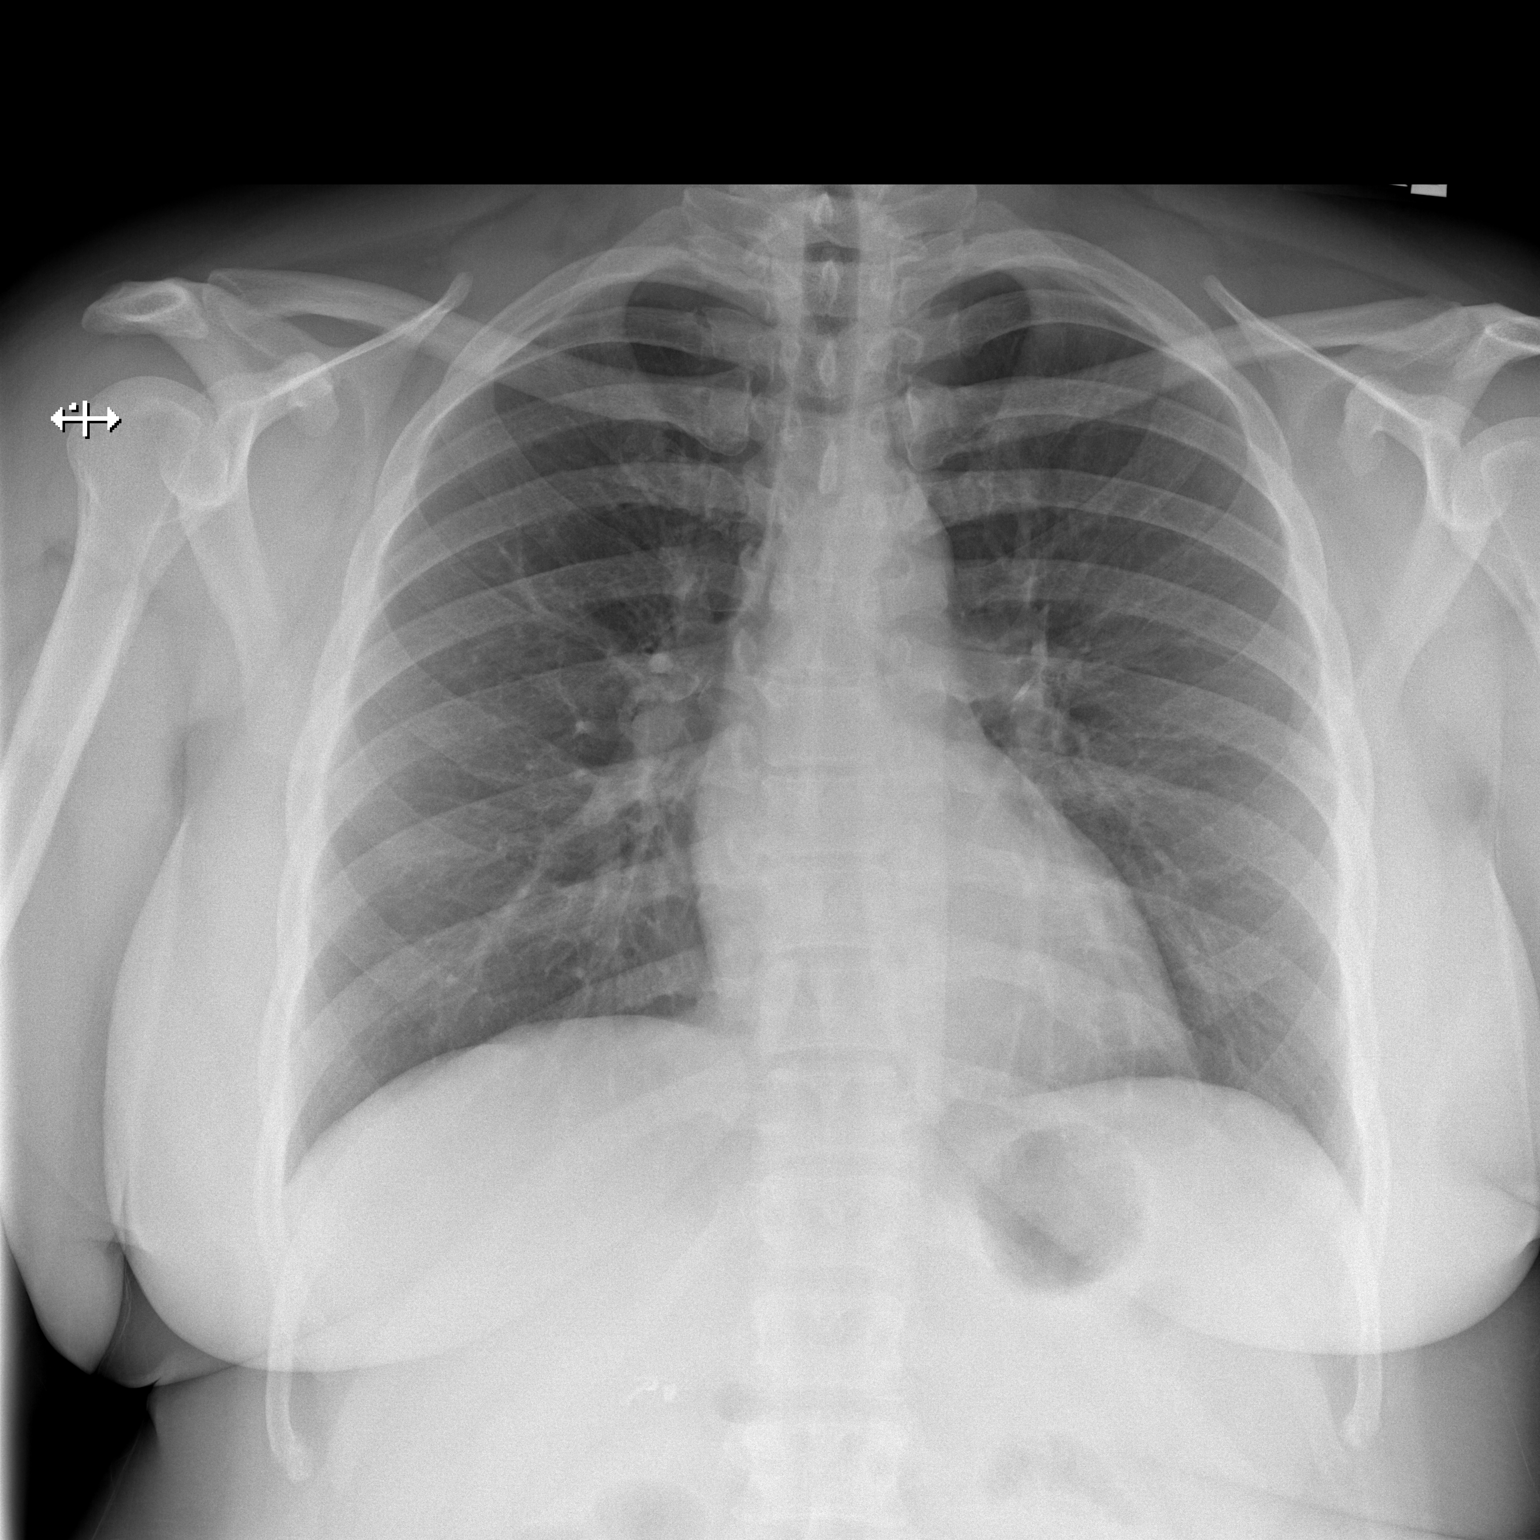

[w chest lat]
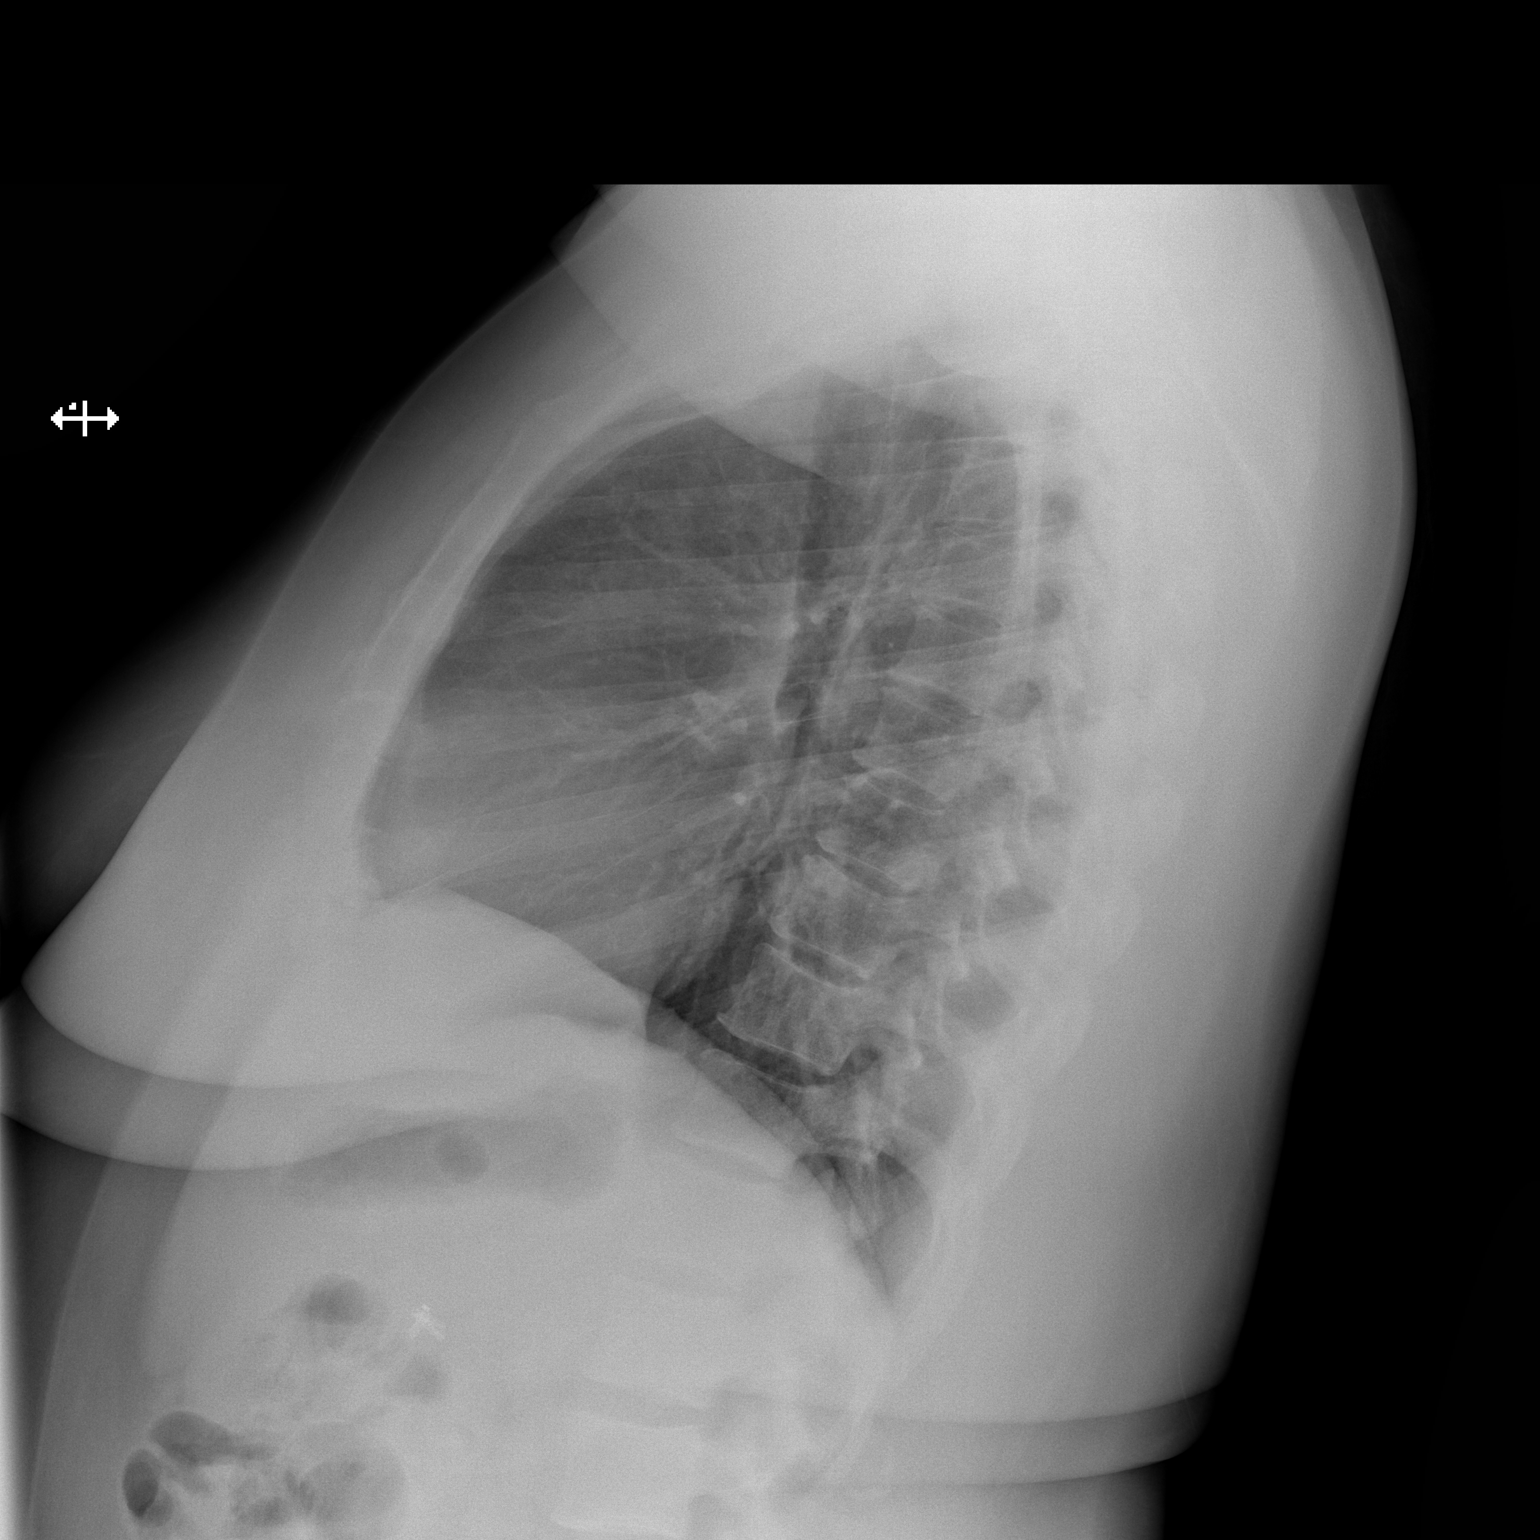

[2 of 2 positions shown; findings below may reference images not displayed]

FINDINGS: The cardiomediastinal silhouette is unremarkable.
The lungs are clear.
There is no evidence of focal airspace disease, pulmonary edema,
pulmonary nodule/mass, pleural effusion, or pneumothorax.
No acute bony abnormalities are identified.
IMPRESSION: No evidence of active cardiopulmonary disease.

## 2011-05-11 LAB — I-STAT 8, (EC8 V) (CONVERTED LAB)
Glucose, Bld: 85
pH, Ven: 7.428 — ABNORMAL HIGH

## 2011-05-11 LAB — PREGNANCY, URINE: Preg Test, Ur: NEGATIVE

## 2011-05-11 LAB — URINALYSIS, ROUTINE W REFLEX MICROSCOPIC
Bilirubin Urine: NEGATIVE
Glucose, UA: NEGATIVE
Ketones, ur: NEGATIVE
Nitrite: NEGATIVE

## 2012-01-27 ENCOUNTER — Encounter (HOSPITAL_COMMUNITY): Payer: Self-pay

## 2012-01-27 ENCOUNTER — Emergency Department (HOSPITAL_COMMUNITY)
Admission: EM | Admit: 2012-01-27 | Discharge: 2012-01-27 | Disposition: A | Discharge status: Home / Self Care | Payer: Self-pay | Attending: Emergency Medicine | Admitting: Emergency Medicine

## 2012-01-27 DIAGNOSIS — R21 Rash and other nonspecific skin eruption: Secondary | ICD-10-CM | POA: Insufficient documentation

## 2012-01-27 DIAGNOSIS — L089 Local infection of the skin and subcutaneous tissue, unspecified: Secondary | ICD-10-CM | POA: Insufficient documentation

## 2012-01-27 HISTORY — DX: Essential (primary) hypertension: I10

## 2012-01-27 MED ORDER — MUPIROCIN 2 % EX OINT: TOPICAL_OINTMENT | Freq: Three times a day (TID) | CUTANEOUS | Status: AC

## 2012-01-27 MED ORDER — SULFAMETHOXAZOLE-TRIMETHOPRIM 800-160 MG PO TABS: ORAL_TABLET | ORAL | Status: DC

## 2012-01-27 NOTE — ED Notes (Signed)
MD at bedside. EDPA Hunt

## 2012-01-27 NOTE — ED Provider Notes (Signed)
Medical screening examination/treatment/procedure(s) were performed by non-physician practitioner and as supervising physician I was immediately available for consultation/collaboration.   Glynn Octave, MD 01/27/12 [phone]

## 2012-01-27 NOTE — Discharge Instructions (Signed)
Clean skin with mild soap and water and then apply topical antibiotic ointment. Take oral antibiotic in complete course. May also apply warm compresses. Monitor for signs or worsening infection to include but not limited to increasing redness, swelling, drainage or pain. Follow up with urgent care in 1 week for recheck of ongoing rash but return to ER for any emergent changing or worsening of symptoms. Establishment with a Primary Care provider is Very important for general health care concerns, minor illness and minor injury.   Skin Infections A skin infection usually develops as a result of disruption of the skin barrier.  CAUSES  A skin infection might occur following:  Trauma or an injury to the skin such as a cut or insect sting.   Inflammation (as in eczema).   Breaks in the skin between the toes (as in athlete's foot).   Swelling (edema).  SYMPTOMS  The legs are the most common site affected. Usually there is:  Redness.   Swelling.   Pain.   There may be red streaks in the area of the infection.  TREATMENT   Minor skin infections may be treated with topical antibiotics, but if the skin infection is severe, hospital care and intravenous (IV) antibiotic treatment may be needed.   Most often skin infections can be treated with oral antibiotic medicine as well as proper rest and elevation of the affected area until the infection improves.   If you are prescribed oral antibiotics, it is important to take them as directed and to take all the pills even if you feel better before you have finished all of the medicine.   You may apply warm compresses to the area for 20-30 minutes 4 times daily.  You might need a tetanus shot now if:  You have no idea when you had the last one.   You have never had a tetanus shot before.   Your wound had dirt in it.  If you need a tetanus shot and you decide not to get one, there is a rare chance of getting tetanus. Sickness from tetanus can be  serious. If you get a tetanus shot, your arm may swell and become red and warm at the shot site. This is common and not a problem. SEEK MEDICAL CARE IF:  The pain and swelling from your infection do not improve within 2 days.  SEEK IMMEDIATE MEDICAL CARE IF:  You develop a fever, chills, or other serious problems.  Document Released: 08/24/2004 Document Revised: 07/06/2011 Document Reviewed: 07/06/2008 [facility] Patient Information 2012 [place], [place].

## 2012-01-27 NOTE — ED Notes (Signed)
Pt reports she was bit by a spider approx 3 days ago, area itching, some redness noted

## 2012-01-27 NOTE — ED Notes (Signed)
Pt reports area to (L) inner ankle that is red, swollen, hot, and itching. States "I think I was bit by a spider 3 days ago." Pt reports "squeezing" area getting some drainage.

## 2012-01-27 NOTE — ED Provider Notes (Signed)
History     CSN: [id number]  Arrival date & time 01/27/12  0906   First MD Initiated Contact with Patient 01/27/12 [phone]      Chief Complaint  Patient presents with  . Insect Bite    (Consider location/radiation/quality/duration/timing/severity/associated sxs/prior treatment) The history is provided by the patient.   Patient presents to emergency department complaining of a three-day history of gradual onset rash over left lower medial leg. Patient states that it initially started as a small patch of "little dots" however states that over the last 3 days a blister formed in the middle of the patch that then busted and drained a clear liquid. Patient states "I figured I got bit by a spider" however she denies actually seeing a spider bite her. She denies any fevers, chills, nausea, vomiting, streaking redness, swelling of ankle or leg. Patient states she's been applying over-the-counter topical creams for itch without any improvement in the rash. She complains of mild ongoing burning itch. No past medical history on file.  No past surgical history on file.  No family history on file.  History  Substance Use Topics  . Smoking status: Not on file  . Smokeless tobacco: Not on file  . Alcohol Use: Not on file    OB History    No data available      Review of Systems  All other systems reviewed and are negative.    Allergies  Review of patient's allergies indicates no known allergies.  Home Medications   Current Outpatient Rx  Name Route Sig Dispense Refill  . ACETAMINOPHEN 500 MG PO TABS Oral Take 1,000 mg by mouth every 6 (six) hours as needed. For pain    . IBUPROFEN 800 MG PO TABS Oral Take 800 mg by mouth every 8 (eight) hours as needed. For pain    . MUPIROCIN 2 % EX OINT Topical Apply topically 3 (three) times daily. 15 g 0  . SULFAMETHOXAZOLE-TRIMETHOPRIM 800-160 MG PO TABS  1 PO BID x 5 days 10 tablet 0    BP 127/58  Pulse 72  Temp 98.4 F (36.9 C) (Oral)   Resp 18  SpO2 97%  Physical Exam  Constitutional: She is oriented to person, place, and time. She appears well-developed and well-nourished. No distress.  HENT:  Head: Normocephalic and atraumatic.  Eyes: Conjunctivae are normal.  Cardiovascular: Normal rate and regular rhythm.   Pulmonary/Chest: Effort normal.  Musculoskeletal: Normal range of motion. She exhibits tenderness. She exhibits no edema.       Right ankle: She exhibits swelling. tenderness.       See skin exam   FROM of ankle and foot without pain (left).   Neurological: She is alert and oriented to person, place, and time.       Normal sensation of entire foot.   Skin: Skin is warm and dry. Rash noted. She is not diaphoretic. No erythema. No pallor.       Quarter size area on left lower medial leg of pin point papules and vesicles with a small central ulceration with faint erythema and mild TTP but no fluctuance, induration or drainage.   Psychiatric: She has a normal mood and affect. Her behavior is normal.    ED Course  Procedures (including critical care time)  Labs Reviewed - No data to display No results found.   1. Rash   2. Skin infection       MDM  Small patch of skin which initially looks like a  contact dermatitis such as poison oak with a small area of superimposed infection. However no signs or symptoms of underlying abscess or deeper infection. There is no induration or fluctuance. She's afebrile. Spoke at length with patient about changing or worsening of symptoms that should prompt immediate return to the emergency department versus following up with an urgent care/primary care provider for recheck of ongoing rash and for general healthcare needs. Patient voices her understanding and is agreeable plan.        [place], [place] 01/27/12 [phone]

## 2012-02-24 ENCOUNTER — Encounter (HOSPITAL_COMMUNITY): Payer: Self-pay | Admitting: Emergency Medicine

## 2012-02-24 ENCOUNTER — Emergency Department (HOSPITAL_COMMUNITY)
Admission: EM | Admit: 2012-02-24 | Discharge: 2012-02-24 | Disposition: A | Discharge status: Home / Self Care | Payer: Self-pay | Attending: Emergency Medicine | Admitting: Emergency Medicine

## 2012-02-24 DIAGNOSIS — R21 Rash and other nonspecific skin eruption: Secondary | ICD-10-CM

## 2012-02-24 NOTE — ED Provider Notes (Signed)
History     CSN: [id number]  Arrival date & time 02/24/12  2956   First MD Initiated Contact with Patient 02/24/12 0825      Chief Complaint  Patient presents with  . Allergic Reaction    (Consider location/radiation/quality/duration/timing/severity/associated sxs/prior treatment) HPI Comments: Mallory Henderson is a 46 y.o. Female who is here for evaluation of a rash. She has itchiness and skin changes of the left and right arm. She had been seen here 01/27/12 for evaluation of a left ankle lesion, possibly consistent with spider bite versus contact dermatitis. She was prescribed Septra, she states that she finished it one week ago; it had been a five-day prescription. The itching, gradually got worse after she took the Septra. She denies nausea, vomiting, weakness, dizziness, shortness of breath, chest pain, or abdominal pain. Her appetite has been good. She worked last night, third shift.   Patient is a 46 y.o. female presenting with allergic reaction. The history is provided by the patient.  Allergic Reaction    Past Medical History  Diagnosis Date  . Hypertension     Past Surgical History  Procedure Date  . Shoulder surgery   . Abdominal surgery   . Cesarean section   . Tubal ligation     History reviewed. No pertinent family history.  History  Substance Use Topics  . Smoking status: Never Smoker   . Smokeless tobacco: Not on file  . Alcohol Use: Yes    OB History    Grav Para Term Preterm Abortions TAB SAB Ect Mult Living                  Review of Systems  All other systems reviewed and are negative.    Allergies  Review of patient's allergies indicates no known allergies.  Home Medications   Current Outpatient Rx  Name Route Sig Dispense Refill  . ACETAMINOPHEN 500 MG PO TABS Oral Take 1,000 mg by mouth every 6 (six) hours as needed. For pain    . IBUPROFEN 800 MG PO TABS Oral Take 800 mg by mouth every 8 (eight) hours as needed. For pain      BP  125/75  Pulse 74  Temp 97.3 F (36.3 C) (Oral)  Resp 20  SpO2 97%  LMP 02/02/2012  Physical Exam  Nursing note and vitals reviewed. Constitutional: She is oriented to person, place, and time. She appears well-developed and well-nourished. She appears distressed (She apppears anxious).  HENT:  Head: Normocephalic and atraumatic.  Eyes: Conjunctivae and EOM are normal. Pupils are equal, round, and reactive to light.  Neck: Normal range of motion and phonation normal. Neck supple.  Cardiovascular: Normal rate.   Pulmonary/Chest: Effort normal. She exhibits no tenderness.  Musculoskeletal: Normal range of motion.  Neurological: She is alert and oriented to person, place, and time. She has normal strength. She exhibits normal muscle tone.  Skin: Skin is warm and dry.       Left upper arm has mild, dry, scaly rash with excoriations; no associated redness, vesicles, or swelling. Right dorsal wrist, and area associated with a tatoo, has a few excoriations without associated swelling, redness, or vesicles. Left medial ankle has a subacute appearing nodule, about 3 mm in diameter, and minimally raised; consistent with a resolving local infection. There is no associated redness, swelling, or streaking.  Psychiatric: Her behavior is normal. Judgment and thought content normal.       Anxious    ED Course  Procedures (including  critical care time)  Labs Reviewed - No data to display No results found.   1. Rash       MDM  Indistinct upper extremity rash, possibly consistent with contact dermatitis, versus heat rash; there are no associated worrisome findings. A secondary problem of a resolving left ankle infection is healing without Complications.    Plan: Home Medications- rec. Claritin and Pepcid; Home Treatments- rest; Recommended follow up- PCP of choice prn        Flint Melter, MD 02/24/12 [phone]

## 2012-02-24 NOTE — ED Notes (Signed)
Patient advised that she was mowing her grass three weeks ago and something bite her on the the inner aspect of the left ankle. She came to [facility] ED on that day. She advised that she was started Septra. She now has a rash on the arms and legs. She complains of severe itching.

## 2012-02-24 NOTE — ED Notes (Signed)
Patient discharged with instructions and teach back method.

## 2012-10-10 ENCOUNTER — Encounter (HOSPITAL_COMMUNITY): Payer: Self-pay | Admitting: Emergency Medicine

## 2012-10-10 ENCOUNTER — Emergency Department (HOSPITAL_COMMUNITY)
Admission: EM | Admit: 2012-10-10 | Discharge: 2012-10-10 | Disposition: A | Discharge status: Home / Self Care | Payer: No Typology Code available for payment source | Attending: Emergency Medicine | Admitting: Emergency Medicine

## 2012-10-10 DIAGNOSIS — S134XXA Sprain of ligaments of cervical spine, initial encounter: Secondary | ICD-10-CM

## 2012-10-10 DIAGNOSIS — M549 Dorsalgia, unspecified: Secondary | ICD-10-CM

## 2012-10-10 DIAGNOSIS — I10 Essential (primary) hypertension: Secondary | ICD-10-CM | POA: Insufficient documentation

## 2012-10-10 DIAGNOSIS — M545 Low back pain, unspecified: Secondary | ICD-10-CM | POA: Insufficient documentation

## 2012-10-10 DIAGNOSIS — Y92410 Unspecified street and highway as the place of occurrence of the external cause: Secondary | ICD-10-CM | POA: Insufficient documentation

## 2012-10-10 DIAGNOSIS — S139XXA Sprain of joints and ligaments of unspecified parts of neck, initial encounter: Secondary | ICD-10-CM | POA: Insufficient documentation

## 2012-10-10 DIAGNOSIS — G8929 Other chronic pain: Secondary | ICD-10-CM | POA: Insufficient documentation

## 2012-10-10 DIAGNOSIS — Y9389 Activity, other specified: Secondary | ICD-10-CM | POA: Insufficient documentation

## 2012-10-10 MED ORDER — IBUPROFEN 600 MG PO TABS: 600.0000 mg | ORAL_TABLET | Freq: Four times a day (QID) | ORAL | Status: DC | PRN

## 2012-10-10 MED ORDER — OXYCODONE-ACETAMINOPHEN 5-325 MG PO TABS: 1.0000 | ORAL_TABLET | Freq: Four times a day (QID) | ORAL | Status: DC | PRN

## 2012-10-10 MED ORDER — CYCLOBENZAPRINE HCL 10 MG PO TABS: 10.0000 mg | ORAL_TABLET | Freq: Two times a day (BID) | ORAL | Status: DC | PRN
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
## 2012-10-14 NOTE — ED Provider Notes (Signed)
Medical screening examination/treatment/procedure(s) were performed by non-physician practitioner and as supervising physician I was immediately available for consultation/collaboration.  Gilda Crease, MD 10/14/12 [phone]

## 2013-02-24 ENCOUNTER — Emergency Department (HOSPITAL_COMMUNITY)
Admission: EM | Admit: 2013-02-24 | Discharge: 2013-02-24 | Disposition: A | Discharge status: Home / Self Care | Payer: Self-pay | Attending: Emergency Medicine | Admitting: Emergency Medicine

## 2013-02-24 ENCOUNTER — Encounter (HOSPITAL_COMMUNITY): Payer: Self-pay | Admitting: Emergency Medicine

## 2013-02-24 DIAGNOSIS — N898 Other specified noninflammatory disorders of vagina: Secondary | ICD-10-CM | POA: Insufficient documentation

## 2013-02-24 DIAGNOSIS — Z91040 Latex allergy status: Secondary | ICD-10-CM | POA: Insufficient documentation

## 2013-02-24 DIAGNOSIS — N949 Unspecified condition associated with female genital organs and menstrual cycle: Secondary | ICD-10-CM | POA: Insufficient documentation

## 2013-02-24 DIAGNOSIS — R102 Pelvic and perineal pain: Secondary | ICD-10-CM

## 2013-02-24 DIAGNOSIS — I10 Essential (primary) hypertension: Secondary | ICD-10-CM | POA: Insufficient documentation

## 2013-02-24 DIAGNOSIS — Z3202 Encounter for pregnancy test, result negative: Secondary | ICD-10-CM | POA: Insufficient documentation

## 2013-02-24 LAB — URINALYSIS, ROUTINE W REFLEX MICROSCOPIC
Bilirubin Urine: NEGATIVE
Ketones, ur: NEGATIVE mg/dL
Protein, ur: NEGATIVE mg/dL
Specific Gravity, Urine: 1.033 — ABNORMAL HIGH (ref 1.005–1.030)
Urobilinogen, UA: 1 mg/dL (ref 0.0–1.0)
pH: 6 (ref 5.0–8.0)

## 2013-02-24 LAB — WET PREP, GENITAL
Clue Cells Wet Prep HPF POC: NONE SEEN
Yeast Wet Prep HPF POC: NONE SEEN

## 2013-02-24 LAB — POCT PREGNANCY, URINE: Preg Test, Ur: NEGATIVE

## 2013-02-24 MED ORDER — CEFTRIAXONE SODIUM 250 MG IJ SOLR
250.0000 mg | Freq: Once | INTRAMUSCULAR | Status: AC
  Administered 2013-02-24: 250 mg via INTRAMUSCULAR
  Filled 2013-02-24: qty 250

## 2013-02-24 MED ORDER — FLUCONAZOLE 150 MG PO TABS
150.0000 mg | ORAL_TABLET | Freq: Every day | ORAL | Status: DC
  Administered 2013-02-24: 150 mg via ORAL
  Filled 2013-02-24: qty 1

## 2013-02-24 MED ORDER — AZITHROMYCIN 250 MG PO TABS
1000.0000 mg | ORAL_TABLET | Freq: Once | ORAL | Status: AC
  Administered 2013-02-24: 1000 mg via ORAL
  Filled 2013-02-24: qty 4

## 2013-02-24 NOTE — ED Notes (Signed)
Pt states she has had dysuria and vaginal pain for past week. States pain radiates from butt to abd after urinating. Also states she has been having white malodorous discharge.

## 2013-02-24 NOTE — ED Provider Notes (Signed)
CSN: [id number]     Arrival date & time 02/24/13  1654 History     First MD Initiated Contact with Patient 02/24/13 1729     Chief Complaint  Patient presents with  . Dysuria  . Vaginal Pain   (Consider location/radiation/quality/duration/timing/severity/associated sxs/prior Treatment) HPI Comments: Patient is a 47 year old female who presents with a 1 week history of vaginal pain. Symptoms started gradually and progressively worsened since the onset. The pain is sharp and intermittent without radiation. She reports associated dysuria and vaginal discharge. No aggravating/alleviating factors. Patient denies any new sexual contacts or other associated symptoms.    Past Medical History  Diagnosis Date  . Hypertension    Past Surgical History  Procedure Laterality Date  . Shoulder surgery    . Abdominal surgery    . Cesarean section    . Tubal ligation     History reviewed. No pertinent family history. History  Substance Use Topics  . Smoking status: Never Smoker   . Smokeless tobacco: Not on file  . Alcohol Use: Yes   OB History   Grav Para Term Preterm Abortions TAB SAB Ect Mult Living                 Review of Systems  Genitourinary: Positive for dysuria and vaginal discharge.  All other systems reviewed and are negative.    Allergies  Latex  Home Medications   Current Outpatient Rx  Name  Route  Sig  Dispense  Refill  . HYDROcodone-acetaminophen (NORCO/VICODIN) 5-325 MG per tablet   Oral   Take 1 tablet by mouth every 6 (six) hours as needed for pain.         Marland Kitchen oxyCODONE-acetaminophen (PERCOCET/ROXICET) 5-325 MG per tablet   Oral   Take 1 tablet by mouth every 6 (six) hours as needed for pain.   12 tablet   0   . Prenatal Vit-Fe Fumarate-FA (PRENATAL MULTIVITAMIN) TABS   Oral   Take 1 tablet by mouth daily at 12 noon.         Marland Kitchen ibuprofen (ADVIL,MOTRIN) 600 MG tablet   Oral   Take 1 tablet (600 mg total) by mouth every 6 (six) hours as needed for  pain.   30 tablet   0    BP 145/97  Pulse 82  Temp(Src) 98.1 F (36.7 C) (Oral)  Resp 14  Ht 5\' 2"  (1.575 m)  Wt 226 lb (102.513 kg)  BMI 41.33 kg/m2  SpO2 92% Physical Exam  Nursing note and vitals reviewed. Constitutional: She is oriented to person, place, and time. She appears well-developed and well-nourished. No distress.  HENT:  Head: Normocephalic and atraumatic.  Eyes: Conjunctivae and EOM are normal.  Neck: Normal range of motion.  Cardiovascular: Normal rate and regular rhythm.  Exam reveals no gallop and no friction rub.   No murmur heard. Pulmonary/Chest: Effort normal and breath sounds normal. She has no wheezes. She has no rales. She exhibits no tenderness.  Abdominal: Soft. She exhibits no distension. There is no tenderness. There is no rebound and no guarding.  Genitourinary:  External genitalia normal. Copious thick, white vaginal discharge in vagina. Cervical os closed. No CMT. No tenderness to palpation or abnormal masses palpated on bimanual exam.   Musculoskeletal: Normal range of motion.  Neurological: She is alert and oriented to person, place, and time. Coordination normal.  Speech is goal-oriented. Moves limbs without ataxia.   Skin: Skin is warm and dry.  Psychiatric: She has a  normal mood and affect. Her behavior is normal.    ED Course   Procedures (including critical care time)  Labs Reviewed  WET PREP, GENITAL - Abnormal; Notable for the following:    WBC, Wet Prep HPF POC MODERATE (*)    All other components within normal limits  URINALYSIS, ROUTINE W REFLEX MICROSCOPIC - Abnormal; Notable for the following:    APPearance CLOUDY (*)    Specific Gravity, Urine 1.033 (*)    All other components within normal limits  URINE CULTURE  GC/CHLAMYDIA PROBE AMP  POCT PREGNANCY, URINE   No results found.  1. Vaginal pain   2. Vaginal discharge     MDM  6:42 PM Urinalysis and wet prep pending. Vitals stable and patient afebrile.   7:58  PM Urinalysis unremarkable. Wet prep shows moderate WBC. Patient will be treated for GC/chlamydia prophylactically. Patient will have diflucan as requested. Vitals stable and patient afebrile. Patient will be discharged without further evaluation.   Emilia Beck, PA-C 02/24/13 2008

## 2013-02-24 NOTE — Progress Notes (Signed)
Patient confirms that she does not have health insurance or pcp.  [facility] provided patient with list of pcps who accept self pay patients, discount pahramcies such as [employer], [employer] and [facility], list of community financial resources such as [employer], information about [employer] Act and Medicaid for insurance.  Patient thankful for resources.  No further needs at this time.

## 2013-02-25 LAB — URINE CULTURE
Colony Count: NO GROWTH
Culture: NO GROWTH

## 2013-02-25 LAB — GC/CHLAMYDIA PROBE AMP
CT Probe RNA: NEGATIVE
GC Probe RNA: NEGATIVE

## 2013-02-26 NOTE — ED Provider Notes (Signed)
Medical screening examination/treatment/procedure(s) were performed by non-physician practitioner and as supervising physician I was immediately available for consultation/collaboration.   Isaac Lacson M Giavanni Zeitlin, DO 02/26/13 1710 

## 2013-05-07 ENCOUNTER — Emergency Department (HOSPITAL_COMMUNITY)
Admission: EM | Admit: 2013-05-07 | Discharge: 2013-05-08 | Disposition: A | Discharge status: Home / Self Care | Payer: Self-pay | Attending: Emergency Medicine | Admitting: Emergency Medicine

## 2013-05-07 ENCOUNTER — Emergency Department (HOSPITAL_COMMUNITY): Payer: Self-pay

## 2013-05-07 ENCOUNTER — Encounter (HOSPITAL_COMMUNITY): Payer: Self-pay | Admitting: Emergency Medicine

## 2013-05-07 DIAGNOSIS — I10 Essential (primary) hypertension: Secondary | ICD-10-CM | POA: Insufficient documentation

## 2013-05-07 DIAGNOSIS — R05 Cough: Secondary | ICD-10-CM | POA: Insufficient documentation

## 2013-05-07 DIAGNOSIS — R0789 Other chest pain: Secondary | ICD-10-CM | POA: Insufficient documentation

## 2013-05-07 DIAGNOSIS — R059 Cough, unspecified: Secondary | ICD-10-CM | POA: Insufficient documentation

## 2013-05-07 DIAGNOSIS — R0602 Shortness of breath: Secondary | ICD-10-CM | POA: Insufficient documentation

## 2013-05-07 LAB — CBC
HCT: 34.5 % — ABNORMAL LOW (ref 36.0–46.0)
Hemoglobin: 11.7 g/dL — ABNORMAL LOW (ref 12.0–15.0)
MCH: 27.9 pg (ref 26.0–34.0)
MCHC: 33.9 g/dL (ref 30.0–36.0)
MCV: 82.3 fL (ref 78.0–100.0)
RBC: 4.19 MIL/uL (ref 3.87–5.11)
RDW: 13.5 % (ref 11.5–15.5)

## 2013-05-07 LAB — BASIC METABOLIC PANEL
BUN: 12 mg/dL (ref 6–23)
CO2: 24 mEq/L (ref 19–32)
Calcium: 9.2 mg/dL (ref 8.4–10.5)
Glucose, Bld: 78 mg/dL (ref 70–99)
Potassium: 3.8 mEq/L (ref 3.5–5.1)
Sodium: 137 mEq/L (ref 135–145)

## 2013-05-07 LAB — POCT I-STAT TROPONIN I

## 2013-05-07 LAB — PRO B NATRIURETIC PEPTIDE: Pro B Natriuretic peptide (BNP): 5.5 pg/mL (ref 0–125)

## 2013-05-07 IMAGING — CR DG CHEST 2V
2 series · 2 of 2 positions shown · non-contrast
Comparison: Chest radiograph performed [DATE]

CLINICAL DATA: Chest pressure and shortness of breath.

CHEST - 2 VIEW

[w chest pa]
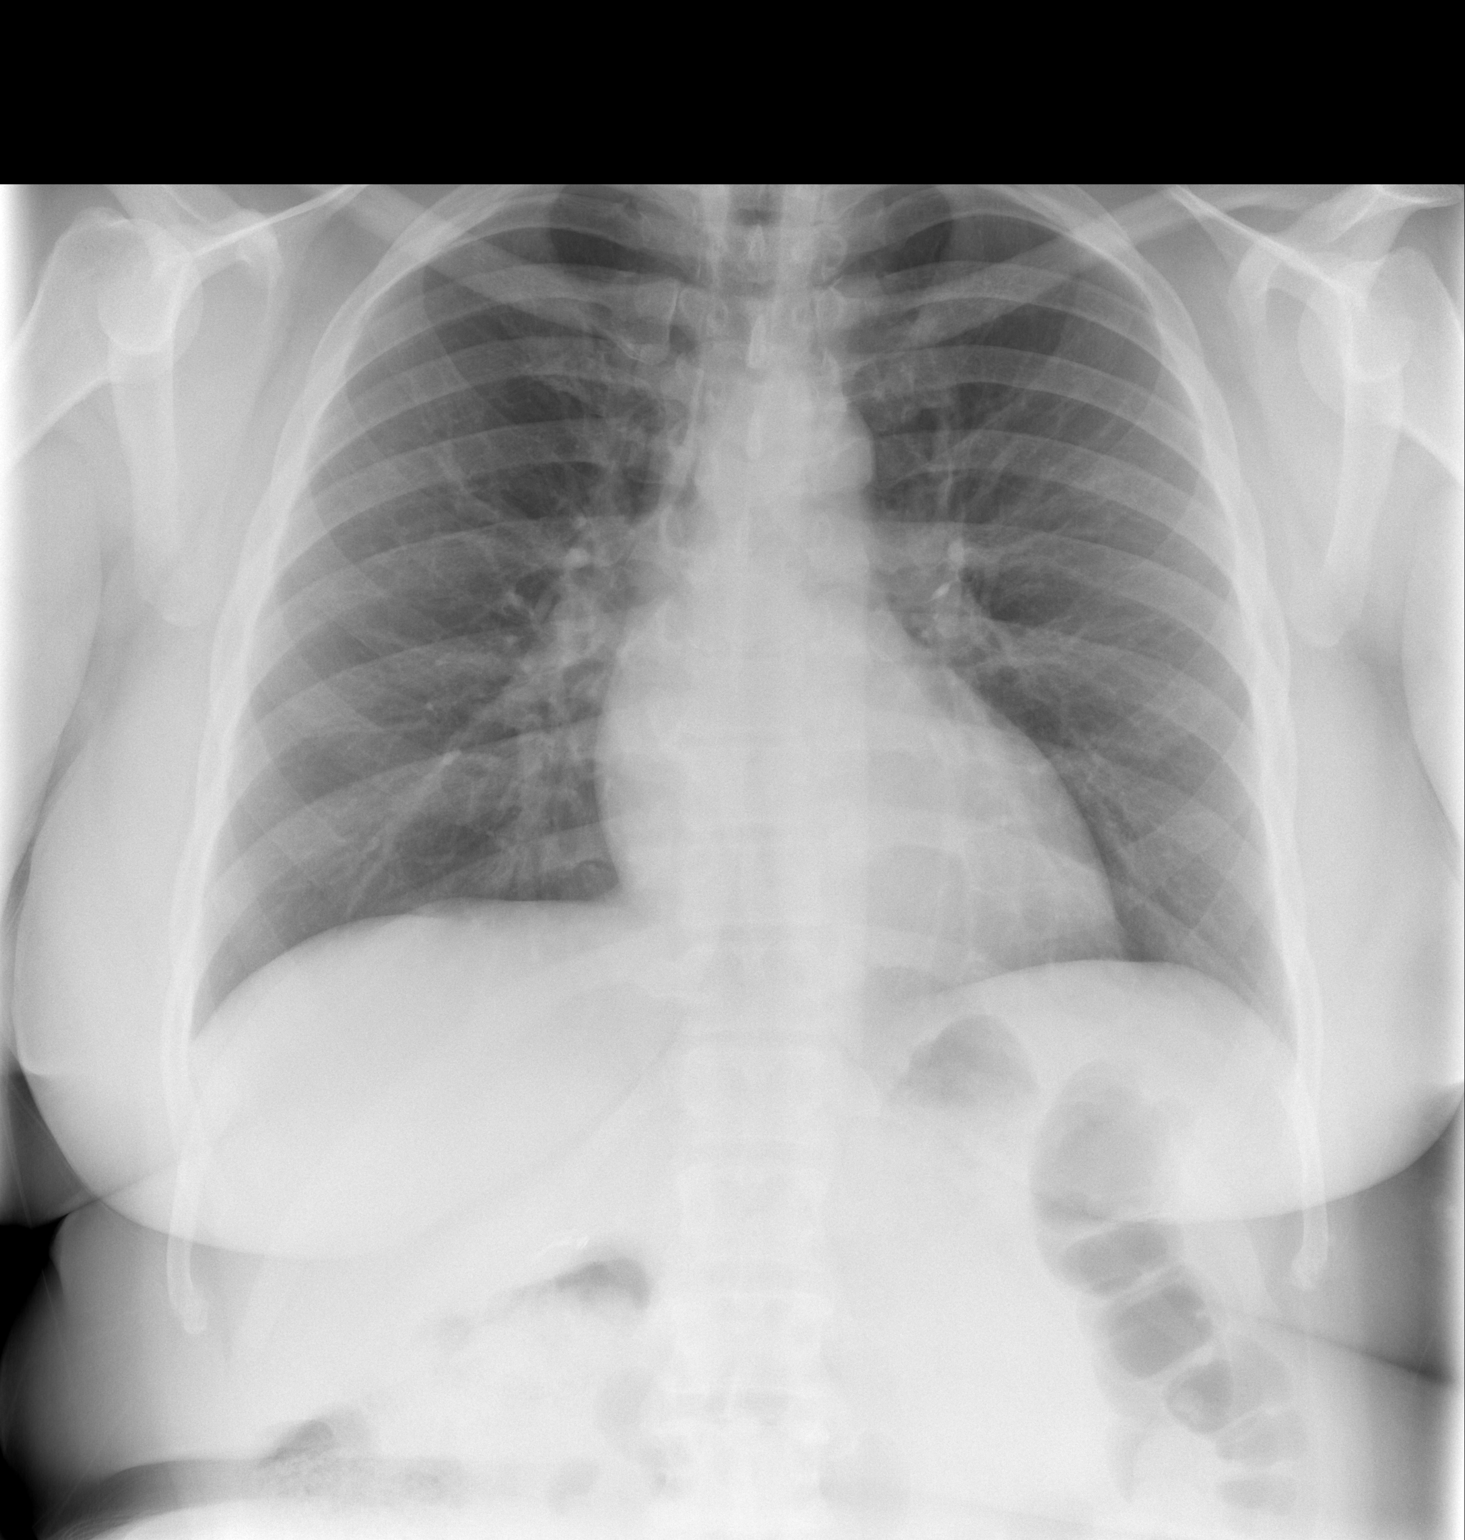

[w chest lat]
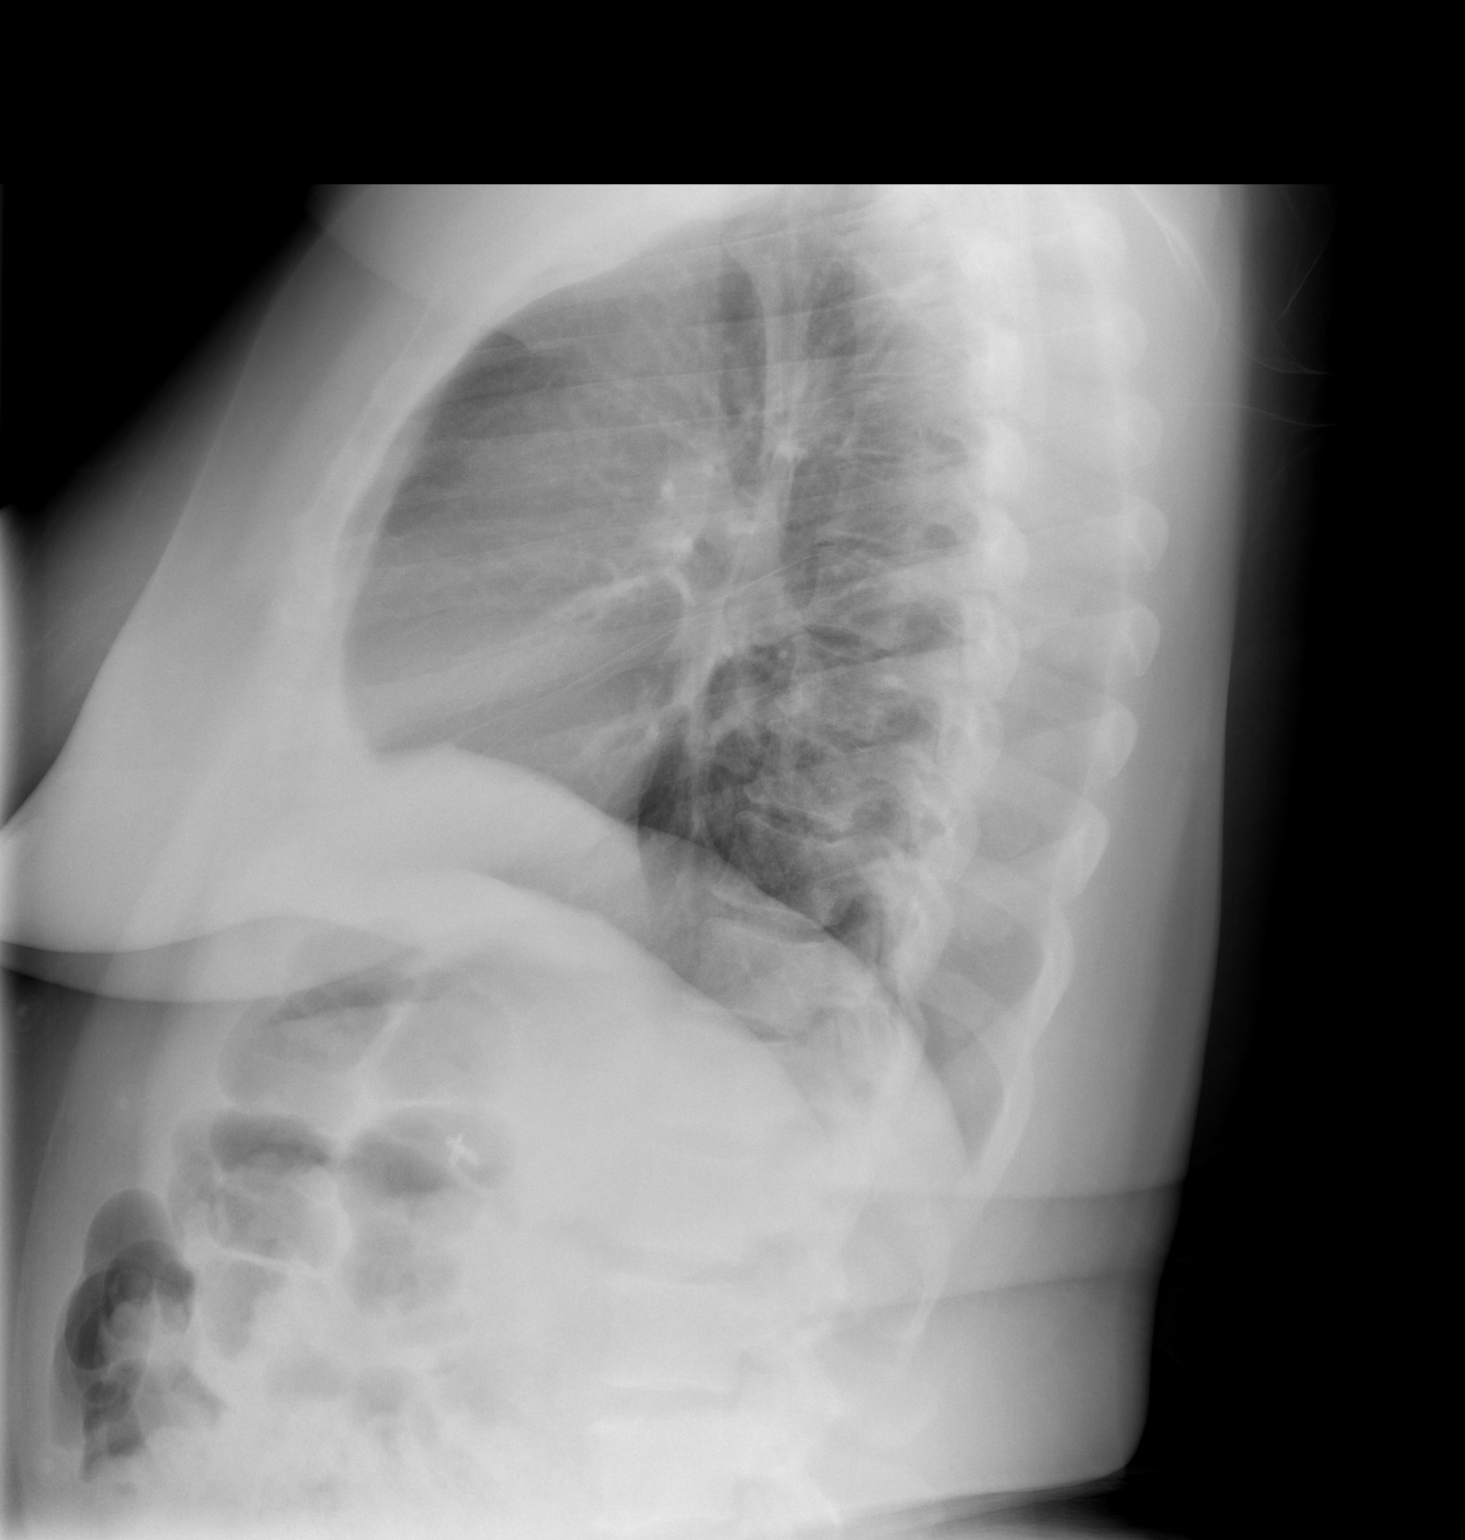

[2 of 2 positions shown; findings below may reference images not displayed]

FINDINGS: The lungs are well-aerated and clear.  There is no
evidence of focal opacification, pleural effusion or pneumothorax.

The heart is normal in size; the mediastinal contour is within
normal limits.  No acute osseous abnormalities are seen.  Clips are
noted within the right upper quadrant, reflecting prior
cholecystectomy.
IMPRESSION: No acute cardiopulmonary process seen.

## 2013-05-07 NOTE — ED Provider Notes (Signed)
CSN: [id number]     Arrival date & time 05/07/13  2217 History   First MD Initiated Contact with Patient 05/07/13 2305     Chief Complaint  Patient presents with  . Chest Pain   (Consider location/radiation/quality/duration/timing/severity/associated sxs/prior Treatment) HPI Comments: Patient presents with one week history of chest pressure, cough, SOB, nausea.  Chest pain is substernal and does not radiate.  It has been ongoing all day.  It is worse with lying down.  Nothing makes it better. Cough is nonproductive. No fever or leg swelling. Denies any cardiac history, not on birth control. Never has had stress test.  Brother died of MI at age 32.  Good PO intake and urine output.  Patient endorses nausea with dry cough and pressure in chest with coughing.   The history is provided by the patient.    Past Medical History  Diagnosis Date  . Hypertension    Past Surgical History  Procedure Laterality Date  . Shoulder surgery    . Abdominal surgery    . Cesarean section    . Tubal ligation     History reviewed. No pertinent family history. History  Substance Use Topics  . Smoking status: Never Smoker   . Smokeless tobacco: Not on file  . Alcohol Use: Yes   OB History   Grav Para Term Preterm Abortions TAB SAB Ect Mult Living                 Review of Systems  Constitutional: Positive for activity change and appetite change. Negative for fever.  HENT: Negative for voice change.   Respiratory: Positive for cough, chest tightness and shortness of breath.   Cardiovascular: Positive for chest pain.  Gastrointestinal: Negative for nausea, vomiting and abdominal pain.  Genitourinary: Negative for dysuria, vaginal bleeding and vaginal discharge.  Musculoskeletal: Negative for back pain.  Skin: Negative for rash.  Neurological: Negative for dizziness, weakness and headaches.  A complete 10 system review of systems was obtained and all systems are negative except as noted in the HPI  and PMH.    Allergies  Latex  Home Medications   Current Outpatient Rx  Name  Route  Sig  Dispense  Refill  . Prenatal Vit-Fe Fumarate-FA (PRENATAL MULTIVITAMIN) TABS   Oral   Take 1 tablet by mouth daily at 12 noon.          BP 145/97  Pulse 68  Temp(Src) 98.3 F (36.8 C) (Oral)  Resp 18  Wt 234 lb 2 oz (106.198 kg)  BMI 42.81 kg/m2  SpO2 100%  LMP 05/06/2013 Physical Exam  Constitutional: She is oriented to person, place, and time. She appears well-developed and well-nourished. No distress.  HENT:  Head: Normocephalic and atraumatic.  Mouth/Throat: No oropharyngeal exudate.  Eyes: Conjunctivae and EOM are normal. Pupils are equal, round, and reactive to light.  Neck: Normal range of motion. Neck supple.  Cardiovascular: Normal rate, regular rhythm and normal heart sounds.   No murmur heard. Pulmonary/Chest: Effort normal and breath sounds normal. No respiratory distress. She exhibits tenderness.  Reproducible sternal tendnerness  Abdominal: Soft. There is no tenderness. There is no rebound and no guarding.  Musculoskeletal: Normal range of motion. She exhibits no edema and no tenderness.  Neurological: She is alert and oriented to person, place, and time. No cranial nerve deficit. She exhibits normal muscle tone. Coordination normal.  Skin: Skin is warm.    ED Course  Procedures (including critical care time) Labs Review Labs Reviewed  CBC - Abnormal; Notable for the following:    Hemoglobin 11.7 (*)    HCT 34.5 (*)    All other components within normal limits  BASIC METABOLIC PANEL - Abnormal; Notable for the following:    GFR calc non Af Amer 65 (*)    GFR calc Af Amer 76 (*)    All other components within normal limits  PRO B NATRIURETIC PEPTIDE  TROPONIN I  POCT I-STAT TROPONIN I   Imaging Review Dg Chest 2 View  05/07/2013   *RADIOLOGY REPORT*  Clinical Data: Chest pressure and shortness of breath.  CHEST - 2 VIEW  Comparison: Chest radiograph  performed 05/09/2011  Findings: The lungs are well-aerated and clear.  There is no evidence of focal opacification, pleural effusion or pneumothorax.  The heart is normal in size; the mediastinal contour is within normal limits.  No acute osseous abnormalities are seen.  Clips are noted within the right upper quadrant, reflecting prior cholecystectomy.  IMPRESSION: No acute cardiopulmonary process seen.   Original Report Authenticated By: Tonia Ghent, M.D.    MDM   1. Atypical chest pain    Atypical chest pain ongoing for 12 hours.  Reproducible.  EKG without ischemic changes.   Atypical for ACS or PE.  CXR negative. Troponin negative.  No tachycardia or hypoxia to suggest PE. No evidence of CHF.  With cough and mucus production, suspect bronchitis as source of symptoms.  No evidence of ACS or PE. Delta troponin negative.  No indication for antibiotics.  D/w Patient over the counter use of decongestants and mucolytics.   Date: 05/07/2013  Rate: 83  Rhythm: normal sinus rhythm  QRS Axis: normal  Intervals: PR prolonged  ST/T Wave abnormalities: normal  Conduction Disutrbances:none  Narrative Interpretation:   Old EKG Reviewed: unchanged      Glynn Octave, MD 05/08/13 0320

## 2013-05-07 NOTE — ED Notes (Signed)
Presents with one week of chest pressure, cough, SOB and nausea. Chest pain described as pressure and rated 8/10.  Lying flat makes pain and SOB worse. Nothing makes pain better.

## 2013-05-08 NOTE — ED Notes (Signed)
Phlebotomy called and informed we need a troponin drawn.

## 2013-05-08 NOTE — ED Notes (Signed)
Charge RN and MD at bedside 

## 2013-05-08 NOTE — ED Notes (Signed)
RN attempted to discharge pt, pt very unhappy and dissatisfied with care. Charge RN and MD informed.

## 2013-06-05 ENCOUNTER — Other Ambulatory Visit: Payer: Self-pay

## 2013-06-12 ENCOUNTER — Encounter: Payer: Self-pay | Admitting: Internal Medicine

## 2013-06-12 ENCOUNTER — Ambulatory Visit: Payer: Self-pay | Attending: Internal Medicine | Admitting: Internal Medicine

## 2013-06-12 VITALS — BP 153/88 | HR 66 | Temp 98.1°F | Resp 16 | Ht 62.0 in | Wt 235.0 lb

## 2013-06-12 DIAGNOSIS — R079 Chest pain, unspecified: Secondary | ICD-10-CM

## 2013-06-12 DIAGNOSIS — I10 Essential (primary) hypertension: Secondary | ICD-10-CM

## 2013-06-12 MED ORDER — ASPIRIN EC 81 MG PO TBEC: 81.0000 mg | DELAYED_RELEASE_TABLET | Freq: Every day | ORAL | Status: DC

## 2013-06-12 MED ORDER — LISINOPRIL-HYDROCHLOROTHIAZIDE 10-12.5 MG PO TABS
1.0000 | ORAL_TABLET | Freq: Every day | ORAL | Status: DC
Start: 2013-06-12 — End: 2015-12-21

## 2013-06-12 NOTE — Progress Notes (Signed)
Patient Demographics  Mallory Henderson, is a 47 y.o. female  ZOX:[id number]  WJX:[id number]  DOB - 1966-03-23  Chief Complaint  Patient presents with  . Follow-up  . Hypertension        Subjective:   Mallory Henderson today is here to establish primary care.  Patient is a 47 year old F. in black female who has been referred to this clinic following the ED visit last month for chest pain. Pain was right-sided chest pain. She is free of chest pain . 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 She denies any fever She claims to have put on approximately 5 pounds in the past 24 hours-however lungs are clear and no edema seen.? Reliability  Currently patient has no other complaints. Patient has also has No headache, No chest pain, No abdominal pain,No Nausea, No new weakness tingling or numbness, No Cough or SOB.  Objective:    Filed Vitals:   06/12/13 1744  BP: 153/88  Pulse: 66  Temp: 98.1 F (36.7 C)  TempSrc: Oral  Resp: 16  Height: 5\' 2"  (1.575 m)  Weight: 235 lb (106.595 kg)  SpO2: 98%     ALLERGIES:   Allergies  Allergen Reactions  . Latex Hives and Rash    PAST MEDICAL HISTORY: Past Medical History  Diagnosis Date  . Hypertension     PAST SURGICAL HISTORY: Past Surgical History  Procedure Laterality Date  . Shoulder surgery    . Abdominal surgery    . Cesarean section    . Tubal ligation      FAMILY HISTORY: Brother died of MI at 88 Father has diabetes  MEDICATIONS AT HOME: Prior to Admission medications   Medication Sig Start Date End Date Taking? Authorizing Provider  aspirin EC 81 MG tablet Take 1 tablet (81 mg total) by mouth daily. 06/12/13   Shanker Levora Dredge, MD  lisinopril-hydrochlorothiazide (PRINZIDE,ZESTORETIC) 10-12.5 MG per tablet Take 1 tablet by mouth daily. 06/12/13   Shanker Levora Dredge, MD   Prenatal Vit-Fe Fumarate-FA (PRENATAL MULTIVITAMIN) TABS Take 1 tablet by mouth daily at 12 noon.    Historical Provider, MD    SOCIAL HISTORY:   reports that she has never smoked. She does not have any smokeless tobacco history on file. She reports that she drinks alcohol. She reports that she does not use illicit drugs.  REVIEW OF SYSTEMS:  Constitutional:   No   Fevers, chills, fatigue.  HEENT:    No headaches, Sore throat,   Cardio-vascular: No chest pain,  Orthopnea, swelling in lower extremities, anasarca, palpitations  GI:  No abdominal pain, nausea, vomiting, diarrhea  Resp: No shortness of breath,  No coughing up of blood.No cough.No wheezing.  Skin:  no rash or lesions.  GU:  no dysuria, change in color of urine, no urgency or frequency.  No flank pain.  Musculoskeletal: No joint pain or swelling.  No decreased range of motion.  No back pain.  Psych: No change in mood or affect. No depression or anxiety.  No memory loss.   Exam  General appearance :Awake, alert, not in any distress. Speech Clear. Not toxic Looking HEENT: Atraumatic and Normocephalic, pupils equally reactive to light and accomodation Neck: supple, no JVD. No cervical lymphadenopathy.  Chest:Good air entry bilaterally, no added sounds  CVS: S1 S2 regular, no murmurs.  Abdomen: Bowel sounds present,  Non tender and not distended with no gaurding, rigidity or rebound. Extremities: B/L Lower Ext shows no edema, both legs are warm to touch Neurology: Awake alert, and oriented X 3, CN II-XII intact, Non focal Skin:No Rash Wounds:N/A    Data Review   CBC No results found for this basename: WBC, HGB, HCT, PLT, MCV, MCH, MCHC, RDW, NEUTRABS, LYMPHSABS, MONOABS, EOSABS, BASOSABS, BANDABS, BANDSABD,  in the last 168 hours  Chemistries   No results found for this basename: NA, K, CL, CO2, GLUCOSE, BUN, CREATININE, GFRCGP, CALCIUM, MG, AST, ALT, ALKPHOS, BILITOT,  in the last 168  hours ------------------------------------------------------------------------------------------------------------------ No results found for this basename: HGBA1C,  in the last 72 hours ------------------------------------------------------------------------------------------------------------------ No results found for this basename: CHOL, HDL, LDLCALC, TRIG, CHOLHDL, LDLDIRECT,  in the last 72 hours ------------------------------------------------------------------------------------------------------------------ No results found for this basename: TSH, T4TOTAL, FREET3, T3FREE, THYROIDAB,  in the last 72 hours ------------------------------------------------------------------------------------------------------------------ No results found for this basename: VITAMINB12, FOLATE, FERRITIN, TIBC, IRON, RETICCTPCT,  in the last 72 hours  Coagulation profile  No results found for this basename: INR, PROTIME,  in the last 168 hours    Assessment & Plan   Uncontrolled hypertension - Start lisinopril/HCTZ- reassess and adjust medications next visit  Family History of coronary disease-Brother died at 21 - Had chest pain approximately a month ago-it was right-sided chest pain - Will start on aspirin - Refer to cardiology- possible stress test and further evaluation - Check A1c, lipid panel  Health Maintenance -Pap Smear: Referred to GYN -Mammogram: Have ordered -Vaccinations: Refused flu vaccine  Follow up in one month  The patient was given clear instructions to go to ER or return to medical center if symptoms don't improve, worsen or new problems develop. The patient verbalized understanding. The patient was told to call to get lab results if they haven't heard anything in the next week.

## 2013-06-14 NOTE — Progress Notes (Signed)
Pt here to establish care  Slight right frontal h/a radiating to neck x 3 wks ago Swelling in lower ankles with weight gain Stopped taking bp meds x 4 yrs ago

## 2013-06-16 ENCOUNTER — Other Ambulatory Visit: Payer: Self-pay

## 2013-06-17 ENCOUNTER — Ambulatory Visit: Payer: No Typology Code available for payment source | Attending: Internal Medicine

## 2013-06-17 DIAGNOSIS — R079 Chest pain, unspecified: Secondary | ICD-10-CM

## 2013-06-17 LAB — LIPID PANEL
Cholesterol: 206 mg/dL — ABNORMAL HIGH (ref 0–200)
HDL: 44 mg/dL (ref >39)
Triglycerides: 143 mg/dL (ref <150)
VLDL: 29 mg/dL (ref 0–40)

## 2013-06-19 ENCOUNTER — Telehealth: Payer: Self-pay | Admitting: Emergency Medicine

## 2013-06-19 NOTE — Telephone Encounter (Signed)
Pt called requesting ATB for sx cough with greenish/yellow mucus,nausea and sore throat x 2 weeks. Pt was seen by Dr. Jerral Ralph  2 weeks ago;no scripts given at visit Informed pt we cant prescribe ATB without a visit.

## 2013-06-30 ENCOUNTER — Ambulatory Visit: Payer: Self-pay

## 2013-07-02 ENCOUNTER — Ambulatory Visit: Payer: Self-pay | Admitting: Cardiology

## 2013-07-18 ENCOUNTER — Ambulatory Visit: Payer: Self-pay

## 2013-11-07 ENCOUNTER — Encounter: Payer: No Typology Code available for payment source | Attending: Family Medicine | Admitting: Dietician

## 2013-11-07 ENCOUNTER — Encounter: Payer: Self-pay | Admitting: Dietician

## 2013-11-07 VITALS — Ht 65.0 in | Wt 229.6 lb

## 2013-11-07 DIAGNOSIS — I10 Essential (primary) hypertension: Secondary | ICD-10-CM | POA: Insufficient documentation

## 2013-11-07 DIAGNOSIS — Z713 Dietary counseling and surveillance: Secondary | ICD-10-CM | POA: Insufficient documentation

## 2013-11-07 DIAGNOSIS — E785 Hyperlipidemia, unspecified: Secondary | ICD-10-CM | POA: Insufficient documentation

## 2013-11-07 DIAGNOSIS — Z833 Family history of diabetes mellitus: Secondary | ICD-10-CM | POA: Insufficient documentation

## 2013-11-07 DIAGNOSIS — E7800 Pure hypercholesterolemia, unspecified: Secondary | ICD-10-CM | POA: Insufficient documentation

## 2013-11-07 DIAGNOSIS — E669 Obesity, unspecified: Secondary | ICD-10-CM | POA: Insufficient documentation

## 2013-11-07 NOTE — Progress Notes (Signed)
Medical Nutrition Therapy:  Appt start time: 0800 end time:  0900.  Assessment:  Primary concerns today: HTN, elevated total cholesterol, LDL, FamHx DM type 2.   Preferred Learning Style:  No preference indicated   Learning Readiness:  Contemplating  MEDICATIONS: see list.   DIETARY INTAKE: Usual eating pattern includes 3 meals and 1-2 snacks per day. Everyday foods include smoothie, sweet drinks.  Avoided foods include none noted.    24-hr recall:  B ( AM): strawberries and banana smoothie with protein powder from sheets. 16 oz.  Usually 8:30 am. Snk ( AM): none. Juice or ginger ale.   L ( PM): not until about 3 pm. Often at [employer] or [employer]. At [employer] [employer], fried fish with baked potato (sour cream and butter) and sweet tea. At [employer], [place] cold cut banana peppers, olives, spinach, oil and vinegar, black pepper on cheese bread 6 inch and water. Goes out twice per week. Otherwise, will have chix, whiting, salmon, tilapia, with a bag of steam in microwave vegetables (potatoes or peas or cauliflower and broccoli), rice pilaf brown or white, and juice to drink.  Snk ( PM): cashews, sesame seed sticks, raisins recently with water or juice. Used to be potato chips, red hots, and a soda.  D ( PM): similar to lunch from home. Occasional spinach salad and spaghetti with meat sauce, or meatloaf with creamed potatoes and macaroni and cheese. Sometimes cheesecake for dessert. With juice or ginger ale. Also enjoys water with crystal light. Snk ( PM): sometimes candy like hershey bar (king size) with nuts or grape heads or red hots. Beverages: ginger ale, juice, water, sweet tea, has cut out colas for the most part (used to drink 2 L pepsi per day). Wine most nights about 2 glasses- usually sweet wine like prosecco or riunite.   Usual physical activity: at one point, she was exercising regularly and had weight loss results. Mostly walking up to a jogging pace, and she did enjoy it. She c/o  constant fatigue.  Progress Towards Goal(s):  In progress.   Nutritional Diagnosis:  NI-5.8.2 Excessive carbohydrate intake As related to HTN, dyslipidemia.  As evidenced by pt report of sweetened beverage intake all day, predominantly starchy vegetables consumed, candy and other desserts consumed regularly.    Intervention:  Nutrition counseling provided regarding sugar reduction, increase in nonstarchy vegetable intake. Primary emphasis placed on eliminating sweetened beverages in favor of water, crystal light, seltzer water, tea sweetened with splenda or other non-nutritive sweetener. Also emphasized regular intake of nonstarchy veg with lunch and dinner using the Plate Method, along with consistent consumption of a high protein food at each meal to achieve a higher proportion of kcal from protein. Exercise methods with low impact were discussed, like cycling, swimming, walking, yoga, pilates.  RD prescribed the following goals for the pt: 1650 kcal/day diet with minimum 100 grams protein to be tracked via MyFitnessPal. Eat lunch and dinner according to the Plate Method each day. Eat fruit in favor of other sweets. Exercise of any type 30 minutes per day 5 days per week. Progress q 2 weeks by increasing 5 minutes per day or adding one day per week. End goal 60 minutes per day 7 days per week of preferred exercise.  No more than one sweetened beverage per week. Reduce alcohol consumption to 7 drinks per week.  Teaching Method Utilized:  Visual Auditory  Handouts given during visit include:  Best Protein, Fat, and Carb rich foods for heart health  Supplement Recommendations (for a protein powder to make her own smoothies at home)  Barriers to learning/adherence to lifestyle change: strong preference for sweet drinks and foods, poor sleep.  Demonstrated degree of understanding via:  Teach Back   Monitoring/Evaluation:  Dietary intake, exercise, portion control, and body weight in 6  week(s).

## 2013-12-25 ENCOUNTER — Ambulatory Visit: Payer: No Typology Code available for payment source | Admitting: Dietician

## 2014-10-05 ENCOUNTER — Other Ambulatory Visit: Payer: Self-pay | Admitting: Obstetrics & Gynecology

## 2014-10-05 DIAGNOSIS — Z1231 Encounter for screening mammogram for malignant neoplasm of breast: Secondary | ICD-10-CM

## 2014-10-14 ENCOUNTER — Ambulatory Visit: Payer: Self-pay

## 2015-02-10 ENCOUNTER — Ambulatory Visit: Payer: Self-pay

## 2015-08-27 ENCOUNTER — Other Ambulatory Visit: Payer: Self-pay | Admitting: Internal Medicine

## 2015-08-27 DIAGNOSIS — Z1231 Encounter for screening mammogram for malignant neoplasm of breast: Secondary | ICD-10-CM

## 2015-09-02 ENCOUNTER — Ambulatory Visit: Payer: Self-pay

## 2015-12-07 ENCOUNTER — Telehealth: Payer: Self-pay | Admitting: Cardiovascular Disease

## 2015-12-07 NOTE — Telephone Encounter (Signed)
Received faxed referral packet from [facility] for upcoming with Dr. Oval Linsey on 12/10/2015 records given to [facility]. cbr

## 2015-12-10 ENCOUNTER — Ambulatory Visit: Payer: BLUE CROSS/BLUE SHIELD | Admitting: Cardiovascular Disease

## 2015-12-21 ENCOUNTER — Ambulatory Visit (INDEPENDENT_AMBULATORY_CARE_PROVIDER_SITE_OTHER): Payer: BLUE CROSS/BLUE SHIELD | Admitting: Cardiovascular Disease

## 2015-12-21 ENCOUNTER — Encounter: Payer: Self-pay | Admitting: Cardiovascular Disease

## 2015-12-21 VITALS — BP 120/88 | HR 57 | Ht 62.0 in | Wt 232.0 lb

## 2015-12-21 DIAGNOSIS — E785 Hyperlipidemia, unspecified: Secondary | ICD-10-CM | POA: Insufficient documentation

## 2015-12-21 DIAGNOSIS — R0789 Other chest pain: Secondary | ICD-10-CM | POA: Diagnosis not present

## 2015-12-21 DIAGNOSIS — R011 Cardiac murmur, unspecified: Secondary | ICD-10-CM

## 2015-12-21 DIAGNOSIS — G4730 Sleep apnea, unspecified: Secondary | ICD-10-CM | POA: Insufficient documentation

## 2015-12-21 DIAGNOSIS — I10 Essential (primary) hypertension: Secondary | ICD-10-CM

## 2015-12-21 DIAGNOSIS — G479 Sleep disorder, unspecified: Secondary | ICD-10-CM

## 2015-12-21 DIAGNOSIS — R0683 Snoring: Secondary | ICD-10-CM

## 2015-12-21 DIAGNOSIS — R5383 Other fatigue: Secondary | ICD-10-CM

## 2015-12-21 NOTE — Patient Instructions (Signed)
Your physician has requested that you have an exercise tolerance test. For further information please visit [email or web address]. Please also follow instruction sheet, as given.  Your physician has requested that you have an echocardiogram. Echocardiography is a painless test that uses sound waves to create images of your heart. It provides your doctor with information about the size and shape of your heart and how well your heart's chambers and valves are working. This procedure takes approximately one hour. There are no restrictions for this procedure.  Your physician recommends that you return for lab work.  Your physician has recommended that you have a sleep study. This test records several body functions during sleep, including: brain activity, eye movement, oxygen and carbon dioxide blood levels, heart rate and rhythm, breathing rate and rhythm, the flow of air through your mouth and nose, snoring, body muscle movements, and chest and belly movement.  Your physician recommends that you schedule a follow-up appointment in: 2 months.

## 2015-12-21 NOTE — Progress Notes (Signed)
Patient ID: Mallory Henderson, female   DOB: Sep 26, 1965, 50 y.o.   MRN: [id number]     Primary MD: Dr. Lady Henderson  PATIENT PROFILE: Mallory Henderson is a 50 y.o. female who is referred through the courtesy of Dr. Jerral Henderson at [facility] in [place] for evaluation of chest pressure.  HPI:  Mallory Henderson has a history of hypertension, hyperlipidemia, and obesity.  She states that she has been on blood pressure medication for approximately 10 years.  She was started on cholesterol medication 4 months ago.  Recently, she has noticed a change in symptoms with walking where she experiences episodes of chest tightness.  She also has noticed episodes of chest burning and stabbing.  She has a family history of peripheral vascular disease with her grandfather being a double amputee.  Her brother died at age 77 with myocardial infarction.  Her sleep is very poor.  Typically she wakes up several times per night.  She goes to bed at 9:30 PM and wakes up at 6.  She snores loudly.  Her sleep is nonrestorative.  She feels sluggish when she awakens.  At times she has noticed a vague chest sensation which awakens her.  She is now referred by Dr. Sheryle Henderson for cardiology evaluation.  Past Medical History  Diagnosis Date  . Hypertension     Past Surgical History  Procedure Laterality Date  . Shoulder surgery    . Abdominal surgery    . Cesarean section    . Tubal ligation      Allergies  Allergen Reactions  . Latex Hives and Rash    Current Outpatient Prescriptions  Medication Sig Dispense Refill  . AMITRIPTYLINE HCL PO Take 1 tablet by mouth at bedtime.    Marland Kitchen aspirin EC 81 MG tablet Take 1 tablet (81 mg total) by mouth daily. 30 tablet 3  . atenolol (TENORMIN) 100 MG tablet Take 1 tablet by mouth daily.  11  . hydrALAZINE (APRESOLINE) 50 MG tablet Take 1 tablet by mouth 2 (two) times daily.  11  . hydrochlorothiazide (HYDRODIURIL) 25 MG tablet Take 1 tablet by mouth every morning.  9  . meloxicam  (MOBIC) 15 MG tablet Take 1 tablet by mouth daily.  11  . predniSONE (DELTASONE) 5 MG tablet Take 1 tablet by mouth daily.  0  . simvastatin (ZOCOR) 10 MG tablet Take 1 tablet by mouth at bedtime.  5   No current facility-administered medications for this visit.    Social History   Social History  . Marital Status: Single    Spouse Name: N/A  . Number of Children: N/A  . Years of Education: N/A   Occupational History  . Not on file.   Social History Main Topics  . Smoking status: Never Smoker   . Smokeless tobacco: Not on file  . Alcohol Use: Yes     Comment: wine socially  . Drug Use: No  . Sexual Activity: Not on file   Other Topics Concern  . Not on file   Social History Narrative   Additional social history is notable in that she is single. She has 4 children and lives at home with 2 of her children.  She works as a Chief Executive Officer.  She completed 12 grades of education.  There is no tobacco use.  She drinks occasional wine.  She does not exercise regular, but just joined [employer].   Family History  Problem Relation Age of Onset  .  Hypertension Father   . Heart disease Brother   . Kidney disease Brother   . Heart attack Brother   . Diabetes Paternal Grandfather   . Heart disease Paternal Grandfather   . Stroke Maternal Grandmother    Family history is notable in that her mother is living at age 46 and has diabetes mellitus.  Her father died at age 60 with cancer of the throat.  The grandfather was a W amputee due to peripheral vascular disease.  She has 3 deceased brothers, one died at age 64 with a myocardial infarction, one died at 68-week-old and another died at age 33 with complications from diabetes mellitus.  She has 3 living brothers.  ROS General: Negative; No fevers, chills, or night sweats HEENT: Negative; No changes in vision or hearing, sinus congestion, difficulty swallowing Pulmonary: Negative; No cough, wheezing, shortness of breath,  hemoptysis Cardiovascular:  See HPI; GI: Negative; No nausea, vomiting, diarrhea, or abdominal pain GU: Negative; No dysuria, hematuria, or difficulty voiding Musculoskeletal: Negative; no myalgias, joint pain, or weakness Hematologic/Oncologic: Negative; no easy bruising, bleeding Endocrine: Negative; no heat/cold intolerance; no diabetes Neuro: Negative; no changes in balance, headaches Skin: Negative; No rashes or skin lesions Psychiatric: Positive for occasional anxiety Sleep: Poor sleep.  Nonrestorative sleep.  Snoring,  no bruxism, restless legs, hypnogagnic hallucinations Other comprehensive 14 point system review is negative   Physical Exam BP 120/88 mmHg  Pulse 57  Ht 5\' 2"  (1.575 m)  Wt 232 lb (105.235 kg)  BMI 42.42 kg/m2  Morbid obesity  Wt Readings from Last 3 Encounters:  12/21/15 232 lb (105.235 kg)  11/07/13 229 lb 9.6 oz (104.146 kg)  06/12/13 235 lb (106.595 kg)   General: Alert, oriented, no distress.  Skin: normal turgor, no rashes, warm and dry; several tattoos HEENT: Normocephalic, atraumatic. Pupils equal round and reactive to light; sclera anicteric; extraocular muscles intact; Fundi No AV nicking.  Disc flat. Nose without nasal septal hypertrophy Mouth/Parynx benign; Mallinpatti scale 3 Neck: No JVD, no carotid bruits; normal carotid upstroke Lungs: clear to ausculatation and percussion; no wheezing or rales Chest wall: without tenderness to palpitation Heart: PMI not displaced, RRR, s1 s2 normal, 1/6 systolic murmur, no diastolic murmur, no rubs, gallops, thrills, or heaves Abdomen: Moderate central adiposity soft, nontender; no hepatosplenomehaly, BS+; abdominal aorta nontender and not dilated by palpation. Back: no CVA tenderness Pulses 2+ Musculoskeletal: full range of motion, normal strength, no joint deformities Extremities: no clubbing cyanosis or edema, Homan's sign negative  Neurologic: grossly nonfocal; Cranial nerves grossly  wnl Psychologic: Normal mood and affect   ECG (independently read by me): Sinus bradycardia with first-degree AV block.  Poor progression V1 through V3.  PR interval 216 ms.  LABS:  BMP Latest Ref Rng 05/07/2013 05/14/2008 05/12/2007  Glucose 70 - 99 mg/dL 78 99 85  BUN 6 - 23 mg/dL 12 7 9   Creatinine 0.50 - 1.10 mg/dL 1.01 0.85 -  Sodium 135 - 145 mEq/L 137 136 139  Potassium 3.5 - 5.1 mEq/L 3.8 3.7 4.1  Chloride 96 - 112 mEq/L 102 101 107  CO2 19 - 32 mEq/L 24 24 -  Calcium 8.4 - 10.5 mg/dL 9.2 9.0 -     No flowsheet data found.  CBC Latest Ref Rng 05/07/2013 05/14/2008 05/12/2007  WBC 4.0 - 10.5 K/uL 7.4 6.3 -  Hemoglobin 12.0 - 15.0 g/dL 11.7(L) 11.3(L) 12.9  Hematocrit 36.0 - 46.0 % 34.5(L) 34.2(L) 38.0  Platelets 150 - 400 K/uL 187 201 -  Lab Results  Component Value Date   MCV 82.3 05/07/2013   MCV 83.6 05/14/2008   No results found for: TSH Lab Results  Component Value Date   HGBA1C 5.2 06/17/2013     BNP No results found for: BNP  ProBNP    Component Value Date/Time   PROBNP 5.5 05/07/2013 2242     Lipid Panel     Component Value Date/Time   CHOL 206* 06/17/2013 0921   TRIG 143 06/17/2013 0921   HDL 44 06/17/2013 0921   CHOLHDL 4.7 06/17/2013 0921   VLDL 29 06/17/2013 0921   LDLCALC 133* 06/17/2013 0921    RADIOLOGY: No results found.   ASSESSMENT AND PLAN: Mallory Henderson is a 50 year old African-American female who has a history of morbid obesity, hypertension, hyperlipidemia, and recently has developed episodes of chest pressure.  She admits to having increased anxiety.  She has experienced this chest discomfort at times while walking.  Other times she notices it more associated with a burning and a stabbing episode.  She has a family history for peripheral vascular disease with a grandfather who was a double amputee.  She also had a brother die at age 51 with a myocardial infarction.  She has a 1/6 systolic murmur.  I am scheduling her for  an echo Doppler study to evaluate for hypertensive heart disease.  With her episodes of chest pressure, cardiac risk factors and family history for premature CAD.  I'm scheduling her for an exercise nuclear study.  I am also concerned that she may very well have obstructive sleep apnea.  She is postmenopausal, snores, has frequent awakenings, and her sleep is nonrestorative. I will schedule her for a sleep study.  A complete set of fasting labs will be obtained and she will be contacted for medication adjustment if needed.  I'll see her back in the office for follow-up evaluation.   Troy Sine, MD, [facility] 12/21/2015 5:33 PM

## 2015-12-29 ENCOUNTER — Ambulatory Visit: Payer: BLUE CROSS/BLUE SHIELD | Admitting: Cardiovascular Disease

## 2016-01-04 ENCOUNTER — Telehealth (HOSPITAL_COMMUNITY): Payer: Self-pay

## 2016-01-04 NOTE — Telephone Encounter (Signed)
Encounter complete. 

## 2016-01-06 ENCOUNTER — Ambulatory Visit (HOSPITAL_COMMUNITY)
Admission: RE | Admit: 2016-01-06 | Discharge: 2016-01-06 | Disposition: A | Discharge status: Home / Self Care | Payer: BLUE CROSS/BLUE SHIELD | Source: Ambulatory Visit | Attending: Cardiology | Admitting: Cardiology

## 2016-01-06 ENCOUNTER — Ambulatory Visit (HOSPITAL_BASED_OUTPATIENT_CLINIC_OR_DEPARTMENT_OTHER): Payer: BLUE CROSS/BLUE SHIELD

## 2016-01-06 ENCOUNTER — Telehealth: Payer: Self-pay | Admitting: Cardiovascular Disease

## 2016-01-06 ENCOUNTER — Other Ambulatory Visit: Payer: Self-pay

## 2016-01-06 DIAGNOSIS — I10 Essential (primary) hypertension: Secondary | ICD-10-CM | POA: Diagnosis not present

## 2016-01-06 DIAGNOSIS — R011 Cardiac murmur, unspecified: Secondary | ICD-10-CM

## 2016-01-06 DIAGNOSIS — R0789 Other chest pain: Secondary | ICD-10-CM

## 2016-01-06 HISTORY — PX: TRANSTHORACIC ECHOCARDIOGRAM: SHX275

## 2016-01-06 HISTORY — PX: OTHER SURGICAL HISTORY: SHX169

## 2016-01-06 LAB — ECHOCARDIOGRAM COMPLETE
Ao-asc: 32 cm
CHL CUP DOP CALC LVOT VTI: 19.3 cm
E decel time: 144 msec
E/e' ratio: 10.61
FS: 33 % (ref 28–44)
IV/PV OW: 1.01
LA ID, A-P, ES: 29 mm
LA diam index: 1.31 cm/m2
LA vol: 39.4 mL
LAVOLA4C: 36.7 mL
LAVOLIN: 17.9 mL/m2
LDCA: 3.14 cm2
LEFT ATRIUM END SYS DIAM: 29 mm
LV E/e' medial: 10.61
LV E/e'average: 10.61
LV PW d: 11 mm — AB (ref 0.6–1.1)
LV TDI E'MEDIAL: 7.07
LV e' LATERAL: 8.08 cm/s
LVOT SV: 61 mL
LVOT peak vel: 98.6 cm/s
LVOTD: 20 mm
Lateral S' vel: 9.58 cm/s
MV Dec: 144
MV pk E vel: 85.7 m/s
MVPG: 3 mmHg
MVPKAVEL: 84.4 m/s
TAPSE: 15.6 mm
TDI e' lateral: 8.08

## 2016-01-06 LAB — EXERCISE TOLERANCE TEST
CHL CUP MPHR: 170 {beats}/min
CSEPEDS: 1 s
CSEPEW: 10.3 METS
Exercise duration (min): 8 min
Peak HR: 153 {beats}/min
Percent HR: 90 %
RPE: 17
Rest HR: 73 {beats}/min

## 2016-01-06 NOTE — Telephone Encounter (Signed)
Advised OK to go ahead and have recommended pre test labwork drawn.

## 2016-01-06 NOTE — Telephone Encounter (Signed)
New message      Pt forgot when nurse told her to go and have labs drawn.  She has the order but need to know when to go to the lab.  Please call

## 2016-01-26 ENCOUNTER — Telehealth: Payer: Self-pay | Admitting: Cardiovascular Disease

## 2016-01-26 NOTE — Telephone Encounter (Signed)
New message ° ° ° ° ° °Calling to get stress test results °

## 2016-02-14 ENCOUNTER — Ambulatory Visit (HOSPITAL_BASED_OUTPATIENT_CLINIC_OR_DEPARTMENT_OTHER): Payer: BLUE CROSS/BLUE SHIELD | Attending: Cardiovascular Disease | Admitting: Cardiovascular Disease

## 2016-02-14 VITALS — Ht 62.0 in | Wt 236.0 lb

## 2016-02-14 DIAGNOSIS — R0683 Snoring: Secondary | ICD-10-CM | POA: Diagnosis not present

## 2016-02-14 DIAGNOSIS — I10 Essential (primary) hypertension: Secondary | ICD-10-CM | POA: Insufficient documentation

## 2016-02-14 DIAGNOSIS — Z7982 Long term (current) use of aspirin: Secondary | ICD-10-CM | POA: Insufficient documentation

## 2016-02-14 DIAGNOSIS — Z79899 Other long term (current) drug therapy: Secondary | ICD-10-CM | POA: Insufficient documentation

## 2016-02-14 DIAGNOSIS — R5383 Other fatigue: Secondary | ICD-10-CM | POA: Insufficient documentation

## 2016-02-14 DIAGNOSIS — E669 Obesity, unspecified: Secondary | ICD-10-CM | POA: Diagnosis not present

## 2016-02-14 DIAGNOSIS — G4733 Obstructive sleep apnea (adult) (pediatric): Secondary | ICD-10-CM | POA: Diagnosis not present

## 2016-02-14 DIAGNOSIS — Z7952 Long term (current) use of systemic steroids: Secondary | ICD-10-CM | POA: Insufficient documentation

## 2016-02-14 DIAGNOSIS — Z6841 Body Mass Index (BMI) 40.0 and over, adult: Secondary | ICD-10-CM | POA: Diagnosis not present

## 2016-02-14 DIAGNOSIS — G4719 Other hypersomnia: Secondary | ICD-10-CM | POA: Diagnosis not present

## 2016-02-14 DIAGNOSIS — G479 Sleep disorder, unspecified: Secondary | ICD-10-CM

## 2016-02-21 NOTE — Procedures (Signed)
Patient Name: Mallory Henderson, Mallory Henderson Date: 02/14/2016 Gender: Female D.O.B: [birth year] Age (years): 33 Referring Provider: Shelva Majestic MD, ABSM Height (inches): 62 Interpreting Physician: Shelva Majestic MD, ABSM Weight (lbs): 236 RPSGT: Carolin Coy BMI: 43 MRN: [id number] Neck Size: 14.50  CLINICAL INFORMATION Sleep Study Type: NPSG   Indication for sleep study: Excessive Daytime Sleepiness, Fatigue, Hypertension, Obesity, Snoring   Epworth Sleepiness Score:  13 c/w excessive daytime sleepiness   SLEEP STUDY TECHNIQUE As per the AASM Manual for the Scoring of Sleep and Associated Events v2.3 (April 2016) with a hypopnea requiring 4% desaturations. The channels recorded and monitored were frontal, central and occipital EEG, electrooculogram (EOG), submentalis EMG (chin), nasal and oral airflow, thoracic and abdominal wall motion, anterior tibialis EMG, snore microphone, electrocardiogram, and pulse oximetry.  MEDICATIONS * AMITRIPTYLINE HCL PO Take 1 tablet by mouth at bedtime.   * aspirin EC 81 MG tablet Take 1 tablet (81 mg total) by mouth daily. 30 tablet 3 * atenolol (TENORMIN) 100 MG tablet Take 1 tablet by mouth daily. 11 * hydrALAZINE (APRESOLINE) 50 MG tablet Take 1 tablet by mouth 2 (two) times daily. 11 * hydrochlorothiazide (HYDRODIURIL) 25 MG tablet Take 1 tablet by mouth every morning. 9 * meloxicam (MOBIC) 15 MG tablet Take 1 tablet by mouth daily. 11 * predniSONE (DELTASONE) 5 MG tablet Take 1 tablet by mouth daily. 0 * simvastatin (ZOCOR) 10 MG tablet Take 1 tablet by mouth at bedtime. 5  Medications self-administered by patient during sleep study : Sleep medicine administered - None  SLEEP ARCHITECTURE The study was initiated at 10:48:18 PM and ended at 4:50:31 AM. Sleep onset time was 9.4 minutes and the sleep efficiency was 90.1%. The total sleep time was 326.5 minutes. Stage REM latency was 336.0 minutes. The patient spent 17.00% of the night in  stage N1 sleep, 78.10% in stage N2 sleep, 0.00% in stage N3 and 4.90% in REM. Alpha intrusion was absent. Supine sleep was 10.03%.  RESPIRATORY PARAMETERS The overall apnea/hypopnea index (AHI) was 4.2 per hour. There were 1 total apneas, including 1 obstructive, 0 central and 0 mixed apneas. There were 22 hypopneas and 131 RERAs.  The RDI was 28.3 per hour.  The AHI during Stage REM sleep was 30.0 per hour. AHI while supine was 11.0 per hour. The mean oxygen saturation was 97.27%. The minimum SpO2 during sleep was 85.00%. Moderate snoring was noted during this study.  CARDIAC DATA The 2 lead EKG demonstrated sinus rhythm. The mean heart rate was 56.14 beats per minute. Other EKG findings include: None.  LEG MOVEMENT DATA The total PLMS were 0 with a resulting PLMS index of 0.00. Associated arousal with leg movement index was 0.0 .  IMPRESSIONS - Although the overall AHI is 4.2 per hour, the RDI is 28.3 per hour and in REM sleep there is severe sleep apnea with an AHI of 30.0/hr. - No significant central sleep apnea occurred during this study (CAI = 0.0/h). - Abnormal sleep architecture with absence of slow-wave sleep and prolonged latency to her REM sleep (336 minutes). - Reduction in REM sleep (4.9%). - Mild oxygen desaturation to a nadir of 85.00%. - The patient snored with Moderate to times moderately severe snoring volume. - No cardiac abnormalities were noted during this study. - Clinically significant periodic limb movements did not occur during sleep. No significant associated arousals.  DIAGNOSIS - OSA  RECOMMENDATIONS - In this patient with significant cardiovascular comorbidities and significant symptoms including loud snoring, nonrestorative  sleep, and excessive daytime sleepiness, although the overall AHI is 4.2 per hour, there is moderately severe elevation of RDI at 28.3/h, and severe sleep apnea during REM sleep,  recommend CPAP titration.  I suspect the overall AHI is  reduced due to the significantly reduced duration of REM  sleep on the present study. - Efforts should be made to optimize nasal and oral pharyngeal patency. - Avoid alcohol, sedatives and other CNS depressants that may worsen sleep apnea and disrupt normal sleep architecture. - Sleep hygiene should be reviewed to assess factors that may improve sleep quality. - Weight management (BMI 43) and regular exercise should be initiated or continued if appropriate.  [Electronically signed] 02/21/2016 07:04 PM  Shelva Majestic MD, [facility], [place], American Board of Sleep Medicine   NPI: [id number]  [place] PH: [phone]   FX: [phone] [place]

## 2016-11-30 ENCOUNTER — Other Ambulatory Visit (HOSPITAL_COMMUNITY): Payer: Self-pay | Admitting: General Surgery

## 2016-12-15 ENCOUNTER — Telehealth: Payer: Self-pay | Admitting: Cardiovascular Disease

## 2016-12-15 NOTE — Telephone Encounter (Signed)
Spoke to patient. Patient states she was speaking with another physician - he reported she should be using a CPAP MACHINE- Patient states she had sleep study in July 2017.  She states she is follwing up on what is the next step - " I do not think I should not be having all these co -pays." "I am not redoing this test"  RN I informed patient wil have to defer this information to personal who handle sleep study and Dr Claiborne Billings

## 2016-12-15 NOTE — Telephone Encounter (Signed)
New message    Pt is calling about an order for a CPAP machine.

## 2016-12-19 ENCOUNTER — Ambulatory Visit: Payer: BLUE CROSS/BLUE SHIELD | Admitting: Registered"

## 2016-12-21 ENCOUNTER — Ambulatory Visit (HOSPITAL_COMMUNITY)
Admission: RE | Admit: 2016-12-21 | Discharge: 2016-12-21 | Disposition: A | Discharge status: Home / Self Care | Payer: BLUE CROSS/BLUE SHIELD | Source: Ambulatory Visit | Attending: General Surgery | Admitting: General Surgery

## 2016-12-21 ENCOUNTER — Encounter (HOSPITAL_COMMUNITY): Payer: Self-pay | Admitting: Radiology

## 2016-12-21 DIAGNOSIS — R9431 Abnormal electrocardiogram [ECG] [EKG]: Secondary | ICD-10-CM | POA: Insufficient documentation

## 2016-12-21 DIAGNOSIS — Z01818 Encounter for other preprocedural examination: Secondary | ICD-10-CM | POA: Insufficient documentation

## 2016-12-21 DIAGNOSIS — Z9049 Acquired absence of other specified parts of digestive tract: Secondary | ICD-10-CM | POA: Diagnosis not present

## 2016-12-21 IMAGING — RF DG UGI W/ KUB
11 of 16 series · 13 of 24 positions shown · non-contrast
Comparison: None.

CLINICAL DATA: Preop gastric sleeve

EXAM:
UPPER GI SERIES WITH KUB
TECHNIQUE: After obtaining a scout radiograph a routine upper GI series was
performed using thin barium
FLUOROSCOPY TIME:  Fluoroscopy Time:  2 minutes 54 second
Radiation Exposure Index (if provided by the fluoroscopic device):
Number of Acquired Spot Images: 0

[Series 1: t abdomen supine · 0.15mm/px · 1 of 1 slices shown]
[im 1/1]
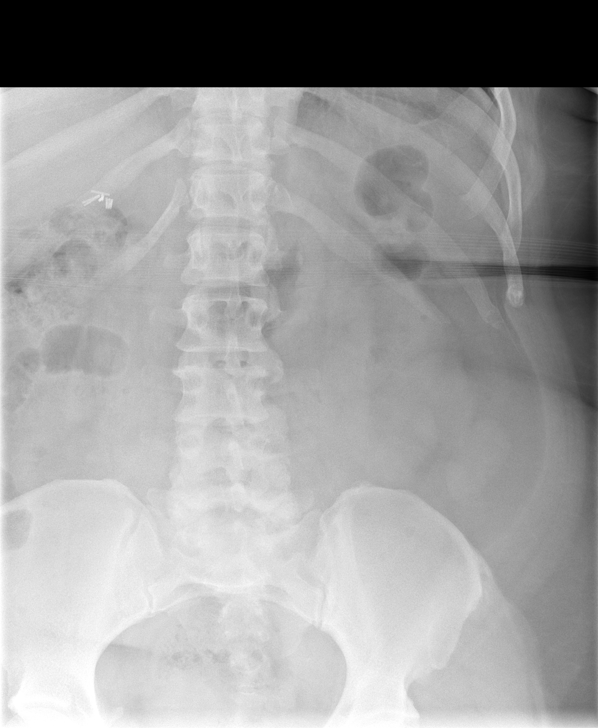

[Series 2: cp_standard · 0.37mm/px · 1 of 17 frames shown (1 of 3)]
[frame 8/17]
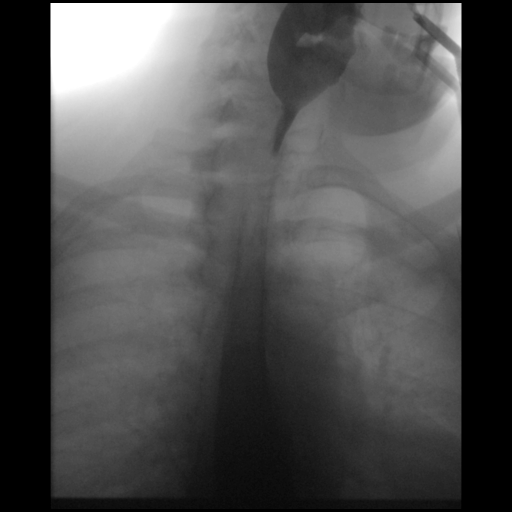

[Series 3: cp_standard · 0.37mm/px · 2 of 19 frames shown (2 of 3)]
[frame 3/19]
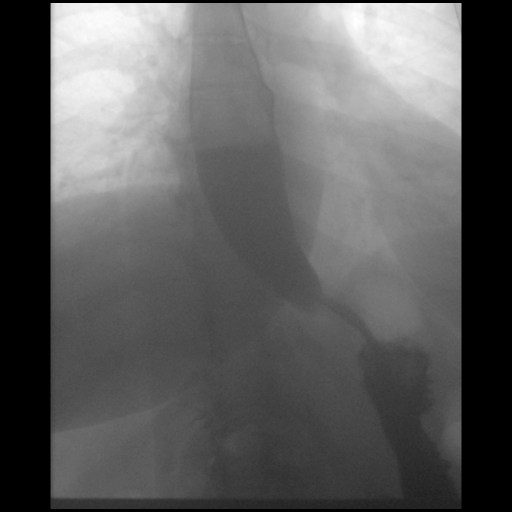
[frame 17/19]
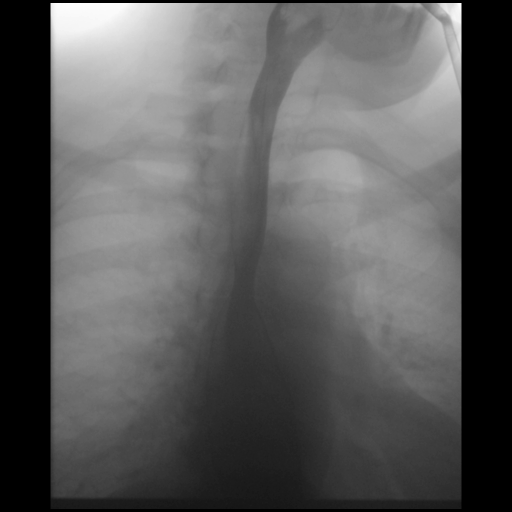

[Series 4: fluoro_barium 2fps_bw · 0.18mm/px · 1 of 2 frames shown (1 of 7)]
[frame 1/2]
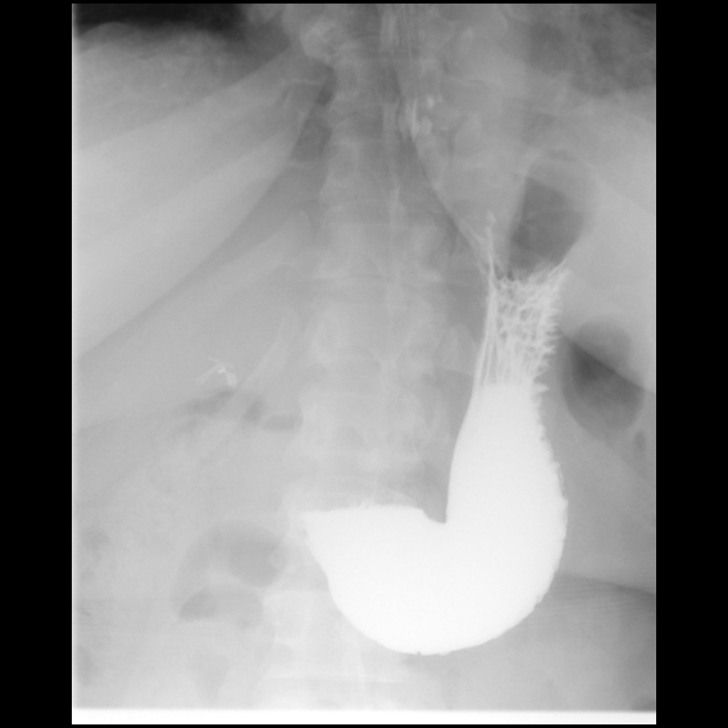

[Series 5: cp_standard · 0.37mm/px · 2 of 21 frames shown (3 of 3)]
[frame 4/21]
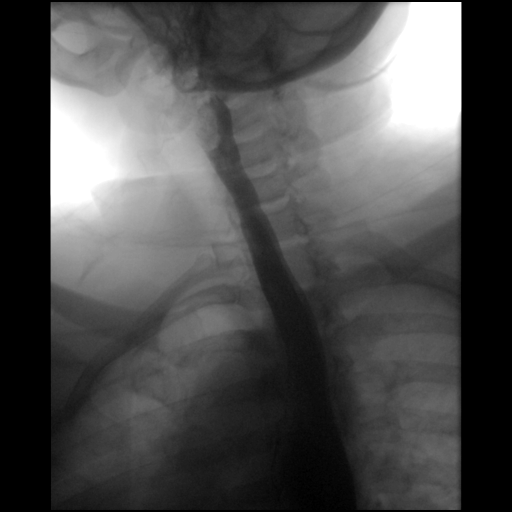
[frame 20/21]
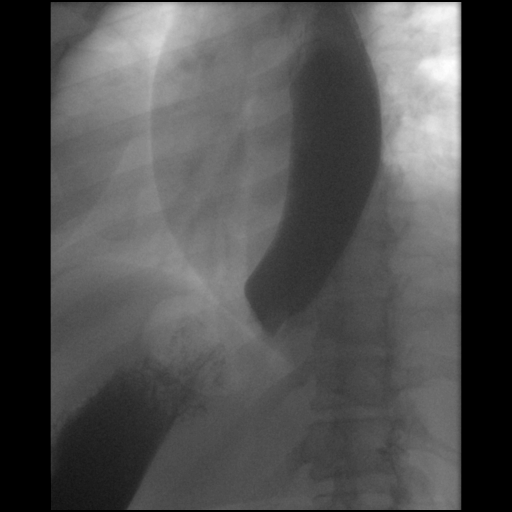

[Series 6: fluoro_barium 2fps_bw · 0.18mm/px · 1 of 1 slices shown (2 of 7)]
[im 1/1]
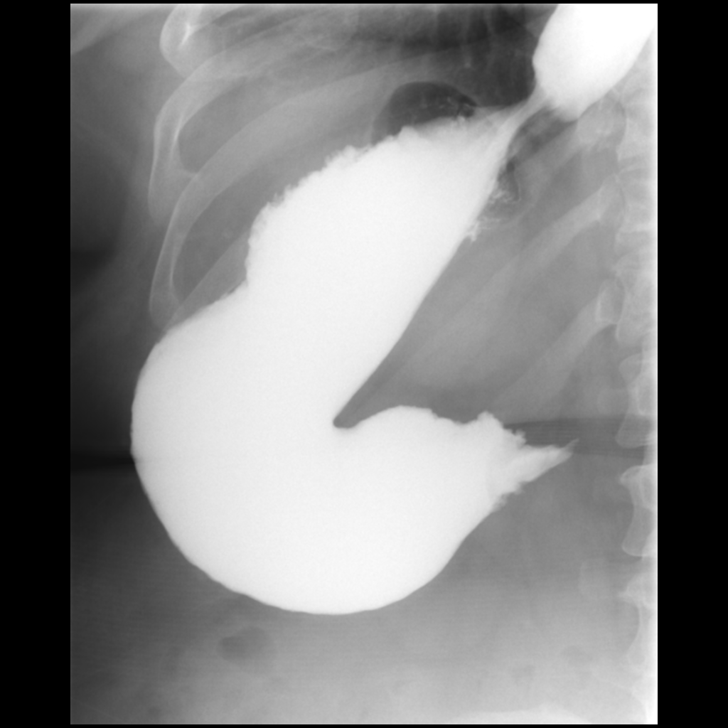

[Series 8: fluoro_barium 2fps_bw · 0.19mm/px · 1 of 1 slices shown (3 of 7)]
[im 1/1]
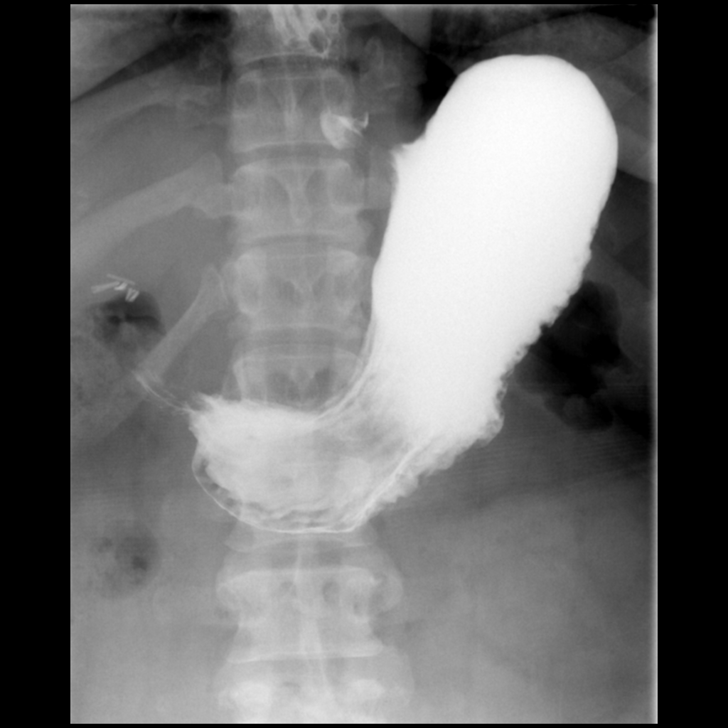

[Series 10: fluoro_barium 2fps_bw · 0.19mm/px · 1 of 1 slices shown (4 of 7)]
[im 1/1]
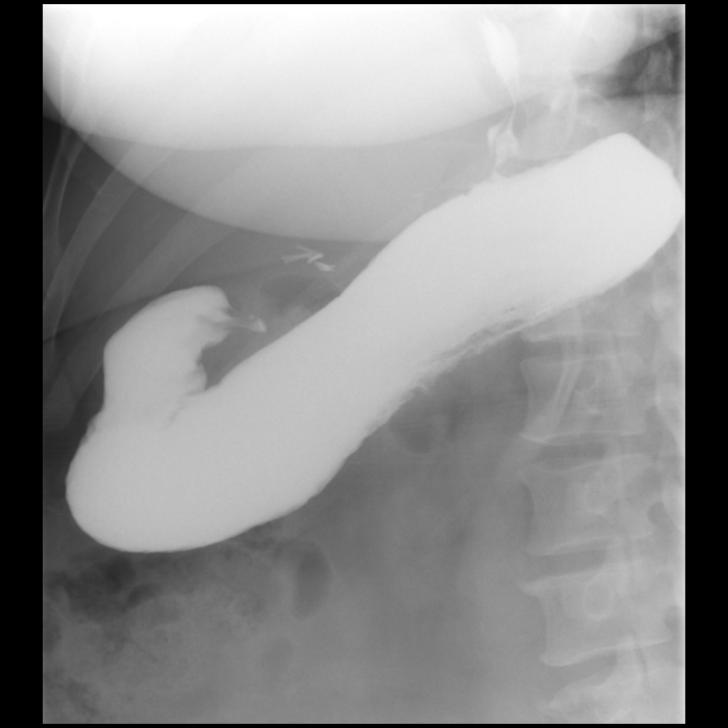

[Series 12: fluoro_barium 2fps_bw · 0.19mm/px · 1 of 1 slices shown (5 of 7)]
[im 1/1]
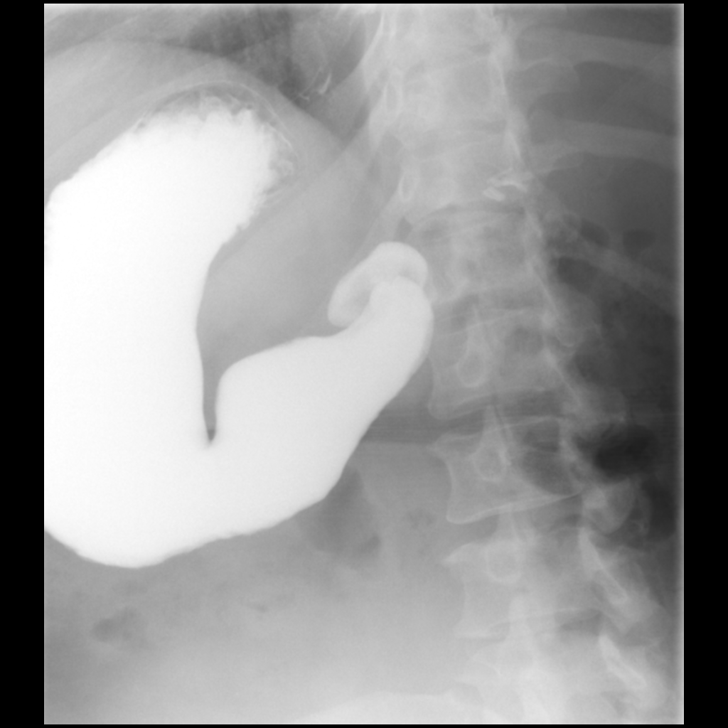

[Series 15: fluoro_barium 2fps_bw · 0.19mm/px · 1 of 1 slices shown (6 of 7)]
[im 1/1]
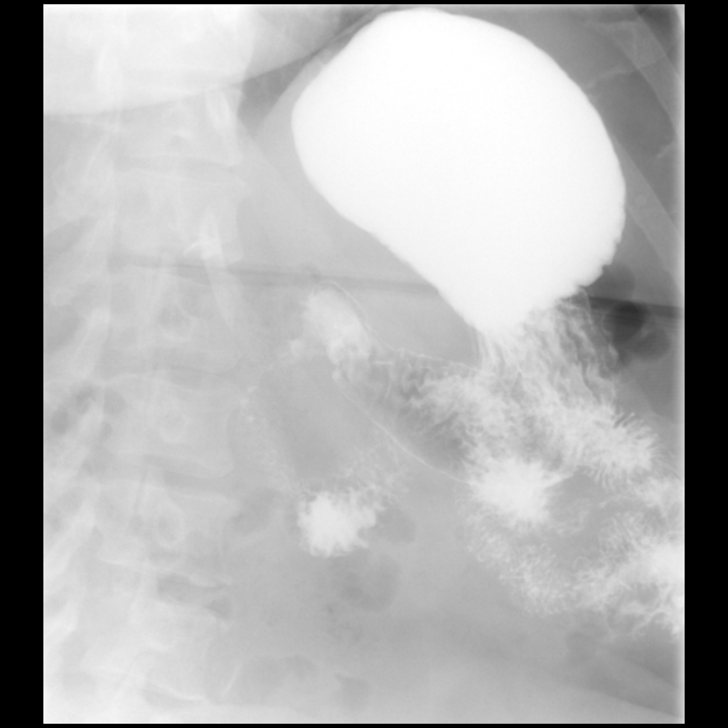

[Series 17: fluoro_barium 2fps_bw · 0.18mm/px · 1 of 1 slices shown (7 of 7)]
[im 1/1]
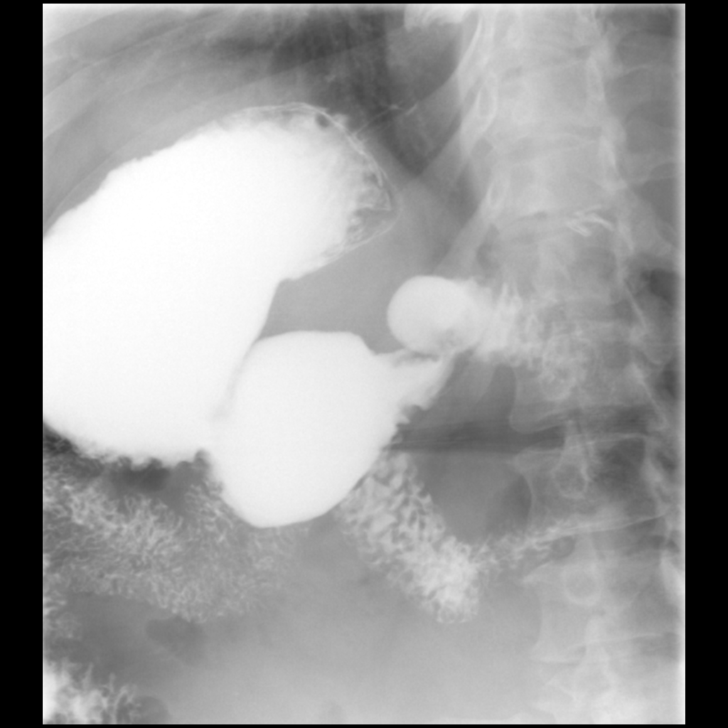

[13 of 24 positions shown; findings below may reference images not displayed]

FINDINGS: Preliminary KUB negative.  Prior cholecystectomy.

Esophageal mucosa and motility normal. Negative for hiatal hernia.
Negative for gastroesophageal reflux. Gastric mucosa normal. Stomach
was slow to empty however the duodenum bulb appears normal and the
duodenum appears normal. Negative for ulcer or edema.
IMPRESSION: Negative

## 2016-12-21 IMAGING — CR DG CHEST 2V
2 series · 2 of 2 positions shown · non-contrast
Comparison: [DATE]

CLINICAL DATA: Preoperative study for bariatric surgery.

EXAM:
CHEST  2 VIEW

[w chest pa]
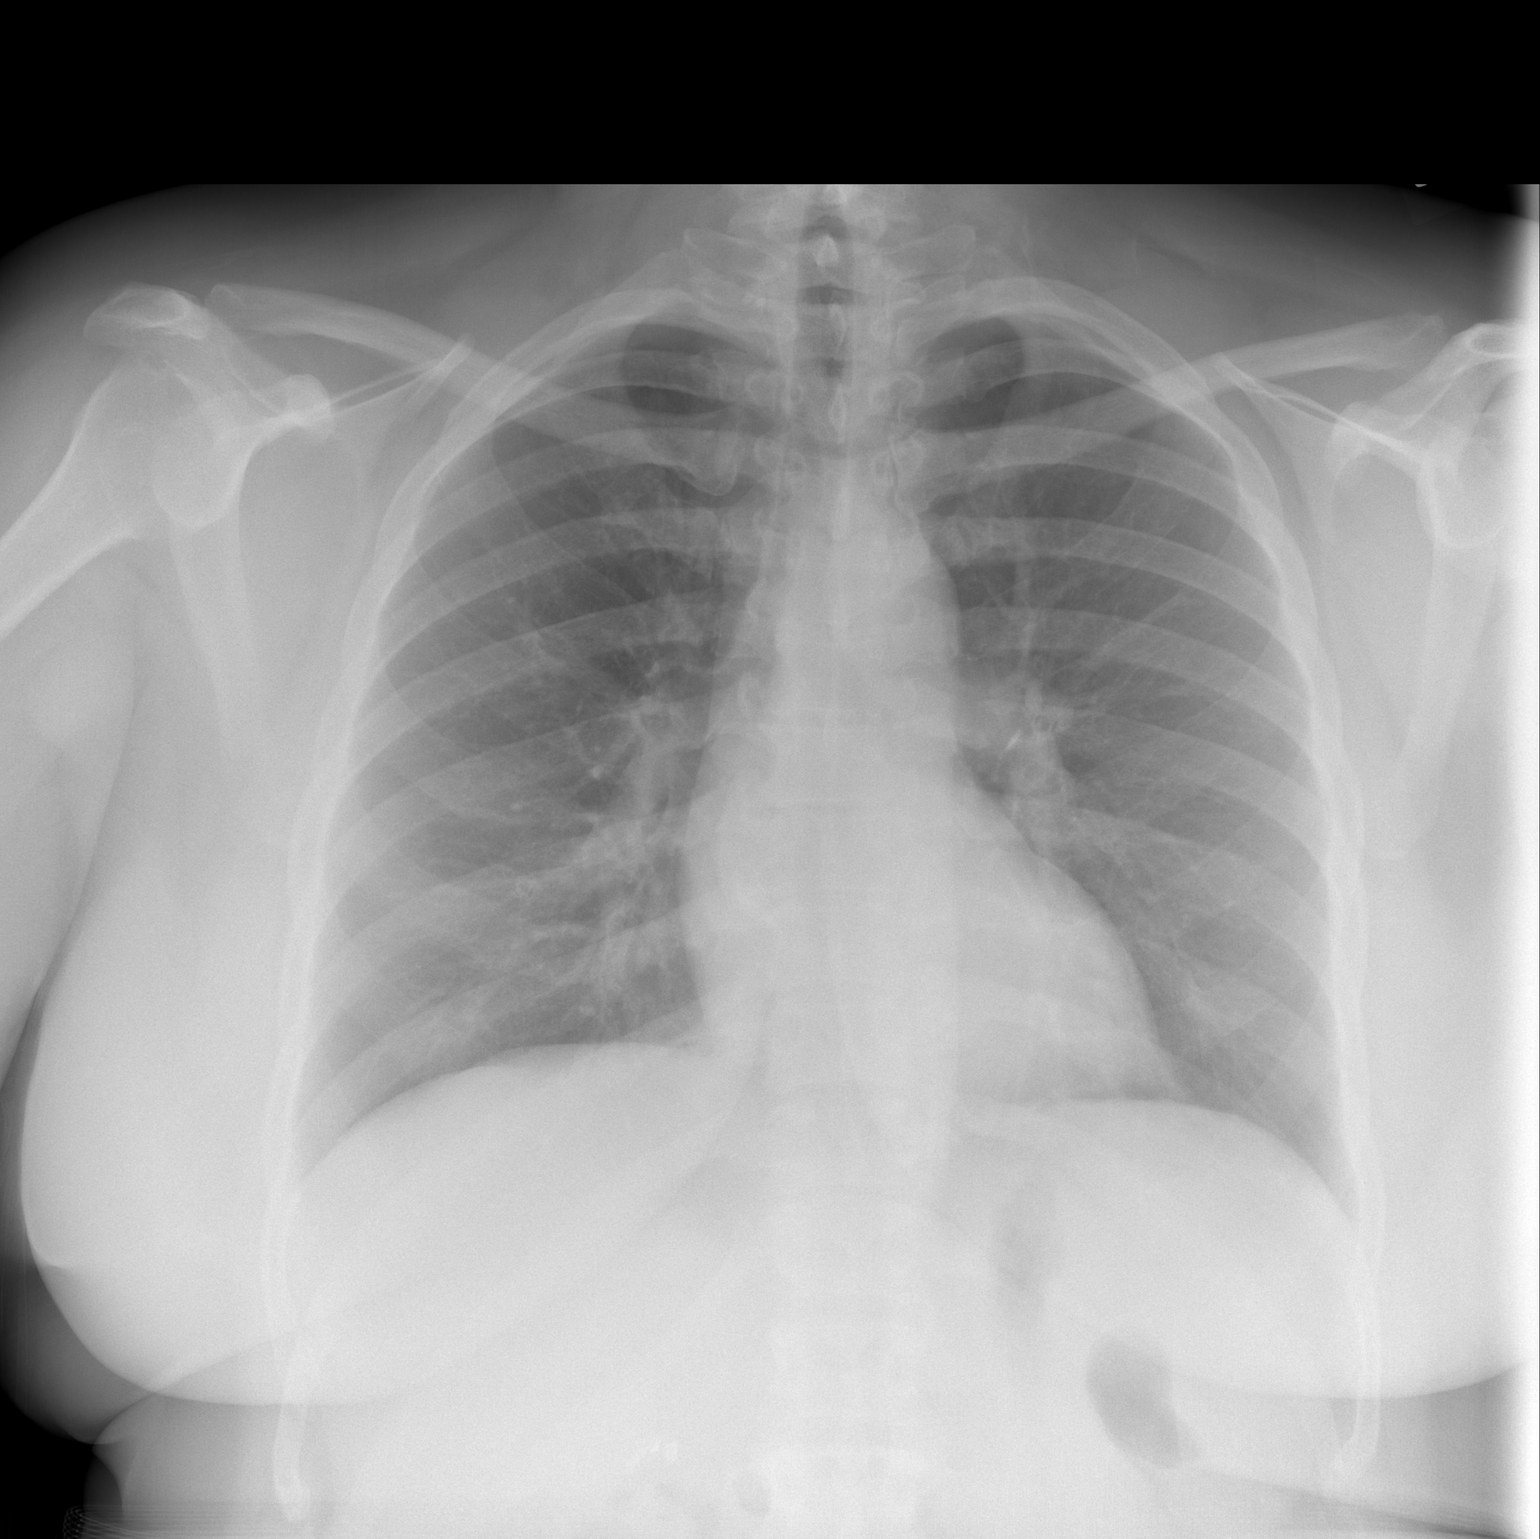

[w chest lat]
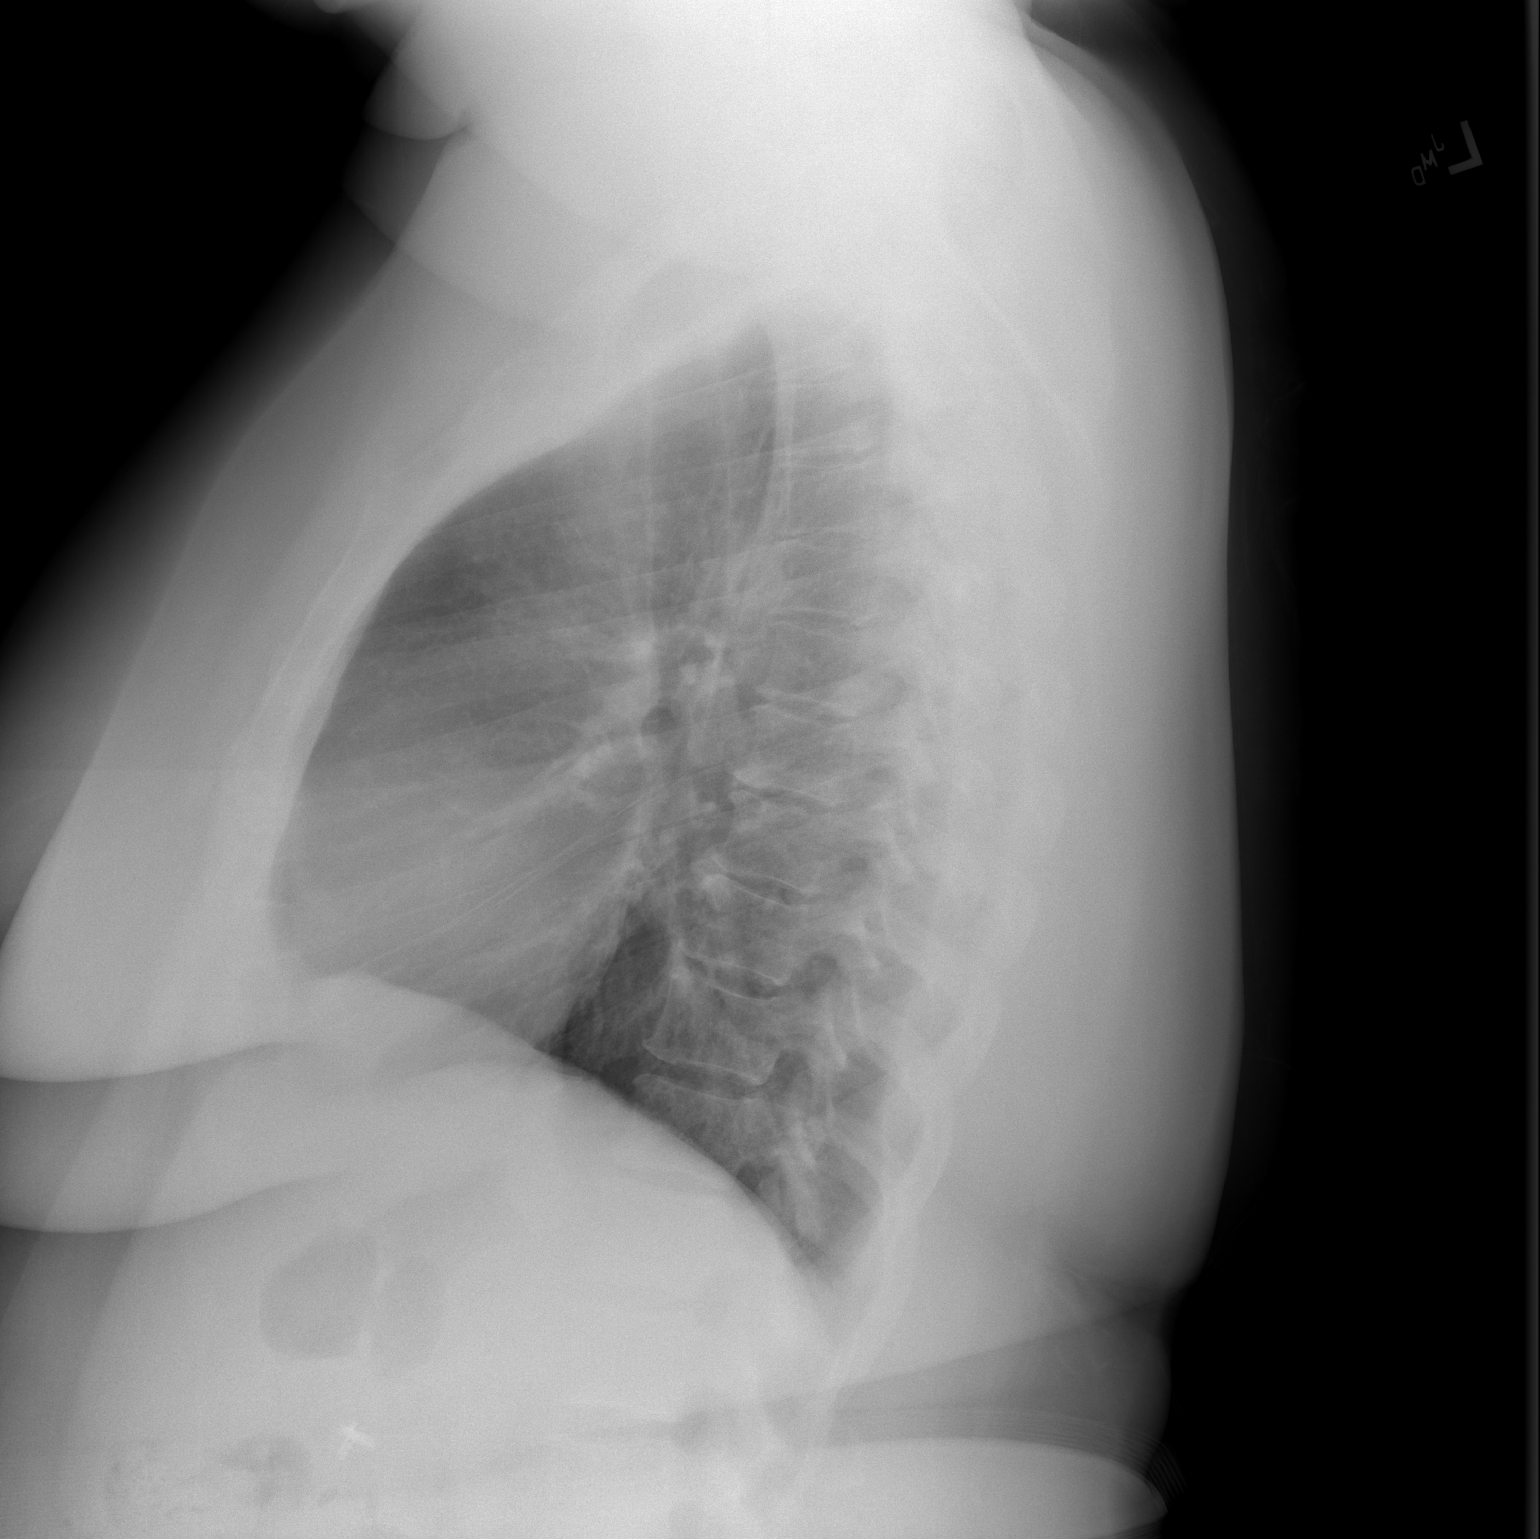

[2 of 2 positions shown; findings below may reference images not displayed]

FINDINGS: The heart size and mediastinal contours are within normal limits.
Both lungs are clear. The visualized skeletal structures are
unremarkable.
IMPRESSION: No active cardiopulmonary disease.

## 2016-12-21 NOTE — Telephone Encounter (Signed)
F/u Message  Pt call requesting to speak with RN about getting results from sleep study. Please call back to discuss

## 2016-12-22 ENCOUNTER — Telehealth: Payer: Self-pay | Admitting: *Deleted

## 2016-12-22 NOTE — Telephone Encounter (Signed)
Left message call was returned. 

## 2016-12-22 NOTE — Telephone Encounter (Signed)
Left message call was returned. Please give me a call back.

## 2017-01-01 ENCOUNTER — Telehealth: Payer: Self-pay | Admitting: *Deleted

## 2017-01-01 NOTE — Telephone Encounter (Signed)
Received a call from [facility] @ [facility] informing me that she called to set the patient up for CPAP therapy.  Once she explained the benefits and how much she would be responsible for out of pocket the patient refused  therapy,stating she cannot afford the co-pay and deductible. Per sharon with [facility] she informed the patient that she will need to repeat her sleep study if she does not get set up by the end of this month. She states the patient became irate and said that no one is going to tell her when she will or will not be set up for her therapy. Dr Claiborne Billings will be notified of the patient's decision.

## 2017-01-08 ENCOUNTER — Telehealth: Payer: Self-pay | Admitting: *Deleted

## 2017-01-08 NOTE — Telephone Encounter (Signed)
Made another call to the patient to give her more information about being set up for her auto titration study. Explained to her that the surgeons may require that she be on CPAP therapy before they perform weight loss surgery on her. If she has to repeat the sleep study it will be 2-3 months out which in turn will cost her more money. Not to mention push her weight loss surgery out. Patient voiced her understanding and states that she will think about moving ahead with it.

## 2017-01-12 NOTE — Telephone Encounter (Signed)
acknowledged

## 2017-01-15 ENCOUNTER — Encounter (HOSPITAL_COMMUNITY): Payer: Self-pay

## 2017-01-15 ENCOUNTER — Emergency Department (HOSPITAL_COMMUNITY)
Admission: EM | Admit: 2017-01-15 | Discharge: 2017-01-15 | Disposition: A | Discharge status: Home / Self Care | Payer: BLUE CROSS/BLUE SHIELD | Attending: Emergency Medicine | Admitting: Emergency Medicine

## 2017-01-15 DIAGNOSIS — Z91040 Latex allergy status: Secondary | ICD-10-CM | POA: Diagnosis not present

## 2017-01-15 DIAGNOSIS — J029 Acute pharyngitis, unspecified: Secondary | ICD-10-CM | POA: Diagnosis not present

## 2017-01-15 DIAGNOSIS — R591 Generalized enlarged lymph nodes: Secondary | ICD-10-CM | POA: Diagnosis present

## 2017-01-15 DIAGNOSIS — I10 Essential (primary) hypertension: Secondary | ICD-10-CM | POA: Insufficient documentation

## 2017-01-15 LAB — RAPID STREP SCREEN (MED CTR MEBANE ONLY): Streptococcus, Group A Screen (Direct): NEGATIVE

## 2017-01-15 MED ORDER — MAGIC MOUTHWASH W/LIDOCAINE: 5.0000 mL | Freq: Four times a day (QID) | ORAL | 0 refills | Status: DC | PRN

## 2017-01-15 MED ORDER — AMOXICILLIN-POT CLAVULANATE 875-125 MG PO TABS: 1.0000 | ORAL_TABLET | Freq: Two times a day (BID) | ORAL | 0 refills | Status: DC

## 2017-01-15 NOTE — ED Triage Notes (Signed)
Pt reports throat swelling and pain since 6/1. Pt reports being seen and Rx Abx by her PCP, but the swelling and pain persists. Pt denies sob and dyspnea. Pt A+OX4, speaking in complete sentences, ambulatory to triage.

## 2017-01-15 NOTE — ED Provider Notes (Signed)
[facility] DEPT Provider Note   CSN: [id number] Arrival date & time: 01/15/17  1027     History   Chief Complaint Chief Complaint  Patient presents with  . Oral Swelling    HPI Mallory Henderson is a 51 y.o. female.  HPI  Pt presenting with c/o sore throat.  She feels pain with swallowing and swollen glands.  She also feels a bump on the back of her tongue that is painful and causes pain with swallowing.  No fever.  No difficulty breathing or swallowing.  She states she was seen by her PMD 18 days ago, was given 7 day course of clindamycin and prednisone.  She did not have much relief after those.  She has been trying chloraseptic spray and other OTC meds for symptoms. No dental or gum pain.  She has dentures and one remaining tooth but there is no pain or swelling around that tooth.  There are no other associated systemic symptoms, there are no other alleviating or modifying factors.   Past Medical History:  Diagnosis Date  . Hypertension     Patient Active Problem List   Diagnosis Date Noted  . Morbid obesity ([place]) 12/21/2015  . Hyperlipidemia 12/21/2015  . Evaluate for Sleep apnea 12/21/2015  . Obesity, unspecified 11/07/2013  . Pure hypercholesterolemia 11/07/2013  . Essential hypertension 11/07/2013    Past Surgical History:  Procedure Laterality Date  . ABDOMINAL SURGERY    . CESAREAN SECTION    . SHOULDER SURGERY    . TUBAL LIGATION      OB History    No data available       Home Medications    Prior to Admission medications   Medication Sig Start Date End Date Taking? Authorizing Provider  AMITRIPTYLINE HCL PO Take 1 tablet by mouth at bedtime.   Yes [provider]  aspirin EC 81 MG tablet Take 1 tablet (81 mg total) by mouth daily. 06/12/13  Yes Ghimire, Henreitta Leber, MD  atenolol (TENORMIN) 100 MG tablet Take 1 tablet by mouth daily. 11/16/15  Yes [provider]  hydrALAZINE (APRESOLINE) 50 MG tablet Take 1 tablet by mouth 2 (two)  times daily. 12/01/15  Yes [provider]  hydrochlorothiazide (HYDRODIURIL) 25 MG tablet Take 1 tablet by mouth every morning. 12/09/15  Yes [provider]  meloxicam (MOBIC) 15 MG tablet Take 1 tablet by mouth daily. 10/01/15  Yes [provider]  predniSONE (DELTASONE) 5 MG tablet Take 1 tablet by mouth daily. 09/16/15  Yes [provider]  simvastatin (ZOCOR) 10 MG tablet Take 1 tablet by mouth at bedtime. 12/07/15  Yes [provider]  amoxicillin-clavulanate (AUGMENTIN) 875-125 MG tablet Take 1 tablet by mouth every 12 (twelve) hours. 01/15/17   Alfonzo Beers, MD  magic mouthwash w/lidocaine SOLN Take 5 mLs by mouth 4 (four) times daily as needed for mouth pain. 01/15/17   Alfonzo Beers, MD    Family History Family History  Problem Relation Age of Onset  . Hypertension Father   . Heart disease Brother   . Kidney disease Brother   . Heart attack Brother   . Diabetes Paternal Grandfather   . Heart disease Paternal Grandfather   . Stroke Maternal Grandmother     Social History Social History  Substance Use Topics  . Smoking status: Never Smoker  . Smokeless tobacco: Not on file  . Alcohol use Yes     Comment: wine socially     Allergies   Hydrocodone  and Latex   Review of Systems Review of Systems  ROS reviewed and all otherwise negative except for mentioned in HPI   Physical Exam Updated Vital Signs BP (!) 150/94 (BP Location: Left Arm)   Pulse (!) 59   Temp 98.4 F (36.9 C) (Oral)   Resp 20   SpO2 99%  Vitals reviewed Physical Exam Physical Examination: General appearance - alert, well appearing, and in no distress Mental status - alert, oriented to person, place, and time Eyes - no conjunctival injection, no scleral icterus Mouth - mucous membranes moist, pharynx normal without lesions, mild OP erythema, palate symmetric, uvula midline Neck - supple, no significant adenopathy Chest - clear to auscultation, no wheezes,  rales or rhonchi, symmetric air entry Heart - normal rate, regular rhythm, normal S1, S2, no murmurs, rubs, clicks or gallops Abdomen - soft, nontender, nondistended, no masses or organomegaly Neurological - alert, oriented, normal speech, no focal findings or movement disorder noted Extremities - peripheral pulses normal, no pedal edema, no clubbing or cyanosis Skin - normal coloration and turgor, no rashes, no suspicious skin lesions noted  ED Treatments / Results  Labs (all labs ordered are listed, but only abnormal results are displayed) Labs Reviewed  RAPID STREP SCREEN (NOT AT [facility])  CULTURE, GROUP A STREP [facility])    EKG  EKG Interpretation None       Radiology No results found.  Procedures Procedures (including critical care time)  Medications Ordered in ED Medications - No data to display   Initial Impression / Assessment and Plan / ED Course  I have reviewed the triage vital signs and the nursing notes.  Pertinent labs & imaging results that were available during my care of the patient were reviewed by me and considered in my medical decision making (see chart for details).     Pt presenting with ongoing throat pain, as well as bump on tongue that is painful.   Patient is overall nontoxic and well hydrated in appearance.  No signs of PTA or RP abscess.  She is handling secretions and drinking well, no trismus.  Pt given rx for outpatient treatment as well as magic mouthwash to help with symptoms.  Discharged with strict return precautions.  Pt agreeable with plan.   Final Clinical Impressions(s) / ED Diagnoses   Final diagnoses:  Sore throat    New Prescriptions Discharge Medication List as of 01/15/2017  4:22 PM    START taking these medications   Details  amoxicillin-clavulanate (AUGMENTIN) 875-125 MG tablet Take 1 tablet by mouth every 12 (twelve) hours., Starting Mon 01/15/2017, Print    magic mouthwash w/lidocaine SOLN Take 5 mLs by mouth 4 (four)  times daily as needed for mouth pain., Starting Mon 01/15/2017, Print         Alfonzo Beers, MD 01/15/17 [id number]

## 2017-01-15 NOTE — ED Notes (Addendum)
Pt presents with swelling and pain to the right back part of mouth.  No obvious swelling noted but pt reports a burning sensation when she swallows.  Pt has reported decrease in oral intake d/t pain. Pt reports taking a week long course of antibiotics with no improvement on symptoms.   Pt a/o x 4 and talks in complete sentences without distress.

## 2017-01-15 NOTE — Discharge Instructions (Signed)
Return to the ED with any concerns including difficulty breathing or swallowing, vomiting and not able to keep down liquids or medicaitons, or any other alarming symptoms

## 2017-01-18 LAB — CULTURE, GROUP A STREP (THRC)

## 2017-02-16 ENCOUNTER — Telehealth: Payer: Self-pay

## 2017-02-16 NOTE — Telephone Encounter (Signed)
Lm for Dr. Sula Rumple office requesting records, as pt is scheduled for consult with CY on 02/19/17.

## 2017-02-19 ENCOUNTER — Telehealth: Payer: Self-pay | Admitting: Cardiovascular Disease

## 2017-02-19 ENCOUNTER — Institutional Professional Consult (permissible substitution): Payer: BLUE CROSS/BLUE SHIELD | Admitting: Internal Medicine

## 2017-02-19 NOTE — Telephone Encounter (Signed)
New message     Patient would like to discuss CPAP with you

## 2017-02-19 NOTE — Telephone Encounter (Signed)
The patient called in stating that she needs a letter to be written for [facility], Dr. Greer Pickerel, that states she has a CPAP and that it is titrated accordingly. Will route to Dr. Evette Georges nurse for her knowledge.

## 2017-03-22 ENCOUNTER — Telehealth: Payer: Self-pay | Admitting: Cardiovascular Disease

## 2017-03-22 NOTE — Telephone Encounter (Signed)
Spoke to patient. She needed CPAP titration settings, sleep study, and OV documentation sent to Dr. Greer Pickerel @ [facility] in advance of a bariatric surgery she is getting set up.  She notes that she tried to get this faxed over earlier in the year. She had contacted surgical office to f/u and they stated it was never received.  I found her file in Sleep Study file cabinet w fax stamp of May 25th 2018. Included was the needed documentation and paper request from surgical office.  I refaxed this & gave patient advice to call if they do not receive it.  Routed to [facility] as FYI.

## 2017-03-22 NOTE — Telephone Encounter (Signed)
Pt says she still not have not received her letter,please call.

## 2017-03-28 ENCOUNTER — Other Ambulatory Visit: Payer: Self-pay | Admitting: Internal Medicine

## 2017-03-28 DIAGNOSIS — Z1231 Encounter for screening mammogram for malignant neoplasm of breast: Secondary | ICD-10-CM

## 2017-04-05 ENCOUNTER — Ambulatory Visit: Payer: BLUE CROSS/BLUE SHIELD

## 2017-05-28 ENCOUNTER — Ambulatory Visit (INDEPENDENT_AMBULATORY_CARE_PROVIDER_SITE_OTHER): Payer: BLUE CROSS/BLUE SHIELD | Admitting: Cardiovascular Disease

## 2017-05-28 VITALS — BP 122/80 | HR 51 | Ht 62.0 in | Wt 263.4 lb

## 2017-05-28 DIAGNOSIS — G4733 Obstructive sleep apnea (adult) (pediatric): Secondary | ICD-10-CM | POA: Diagnosis not present

## 2017-05-28 DIAGNOSIS — I10 Essential (primary) hypertension: Secondary | ICD-10-CM | POA: Diagnosis not present

## 2017-05-28 DIAGNOSIS — R0683 Snoring: Secondary | ICD-10-CM
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
## 2017-05-28 NOTE — Patient Instructions (Signed)
Medication Instructions:  Your physician recommends that you continue on your current medications as directed. Please refer to the Current Medication list given to you today.  Follow-Up: Your physician wants you to follow-up in: 12 months with Dr. Claiborne Billings (sleep clinic). You will receive a reminder letter in the mail two months in advance. If you don't receive a letter, please call our office to schedule the follow-up appointment.   Any Other Special Instructions Will Be Listed Below (If Applicable).     If you need a refill on your cardiac medications before your next appointment, please call your pharmacy.

## 2017-05-30 NOTE — Addendum Note (Signed)
Addended by: Shelva Majestic A on: 05/30/2017 07:03 PM   Modules accepted: Level of Service

## 2017-06-05 ENCOUNTER — Ambulatory Visit: Payer: BLUE CROSS/BLUE SHIELD

## 2017-06-18 ENCOUNTER — Ambulatory Visit: Payer: BLUE CROSS/BLUE SHIELD

## 2017-11-13 ENCOUNTER — Encounter (HOSPITAL_COMMUNITY): Payer: Self-pay | Admitting: Emergency Medicine

## 2017-11-13 ENCOUNTER — Emergency Department (HOSPITAL_COMMUNITY)
Admission: EM | Admit: 2017-11-13 | Discharge: 2017-11-13 | Disposition: A | Discharge status: Home / Self Care | Payer: BLUE CROSS/BLUE SHIELD | Attending: Emergency Medicine | Admitting: Emergency Medicine

## 2017-11-13 ENCOUNTER — Emergency Department (HOSPITAL_COMMUNITY): Payer: BLUE CROSS/BLUE SHIELD

## 2017-11-13 DIAGNOSIS — J069 Acute upper respiratory infection, unspecified: Secondary | ICD-10-CM | POA: Diagnosis not present

## 2017-11-13 DIAGNOSIS — I10 Essential (primary) hypertension: Secondary | ICD-10-CM | POA: Insufficient documentation

## 2017-11-13 DIAGNOSIS — Z7982 Long term (current) use of aspirin: Secondary | ICD-10-CM | POA: Diagnosis not present

## 2017-11-13 DIAGNOSIS — Z79899 Other long term (current) drug therapy: Secondary | ICD-10-CM | POA: Insufficient documentation

## 2017-11-13 DIAGNOSIS — B9789 Other viral agents as the cause of diseases classified elsewhere: Secondary | ICD-10-CM

## 2017-11-13 DIAGNOSIS — R05 Cough: Secondary | ICD-10-CM | POA: Diagnosis present

## 2017-11-13 IMAGING — CR DG CHEST 2V
2 series · 2 of 2 positions shown · non-contrast
Comparison: Radiographs [DATE].

CLINICAL DATA: Productive cough, shortness of breath.

EXAM:
CHEST - 2 VIEW

[w chest pa]
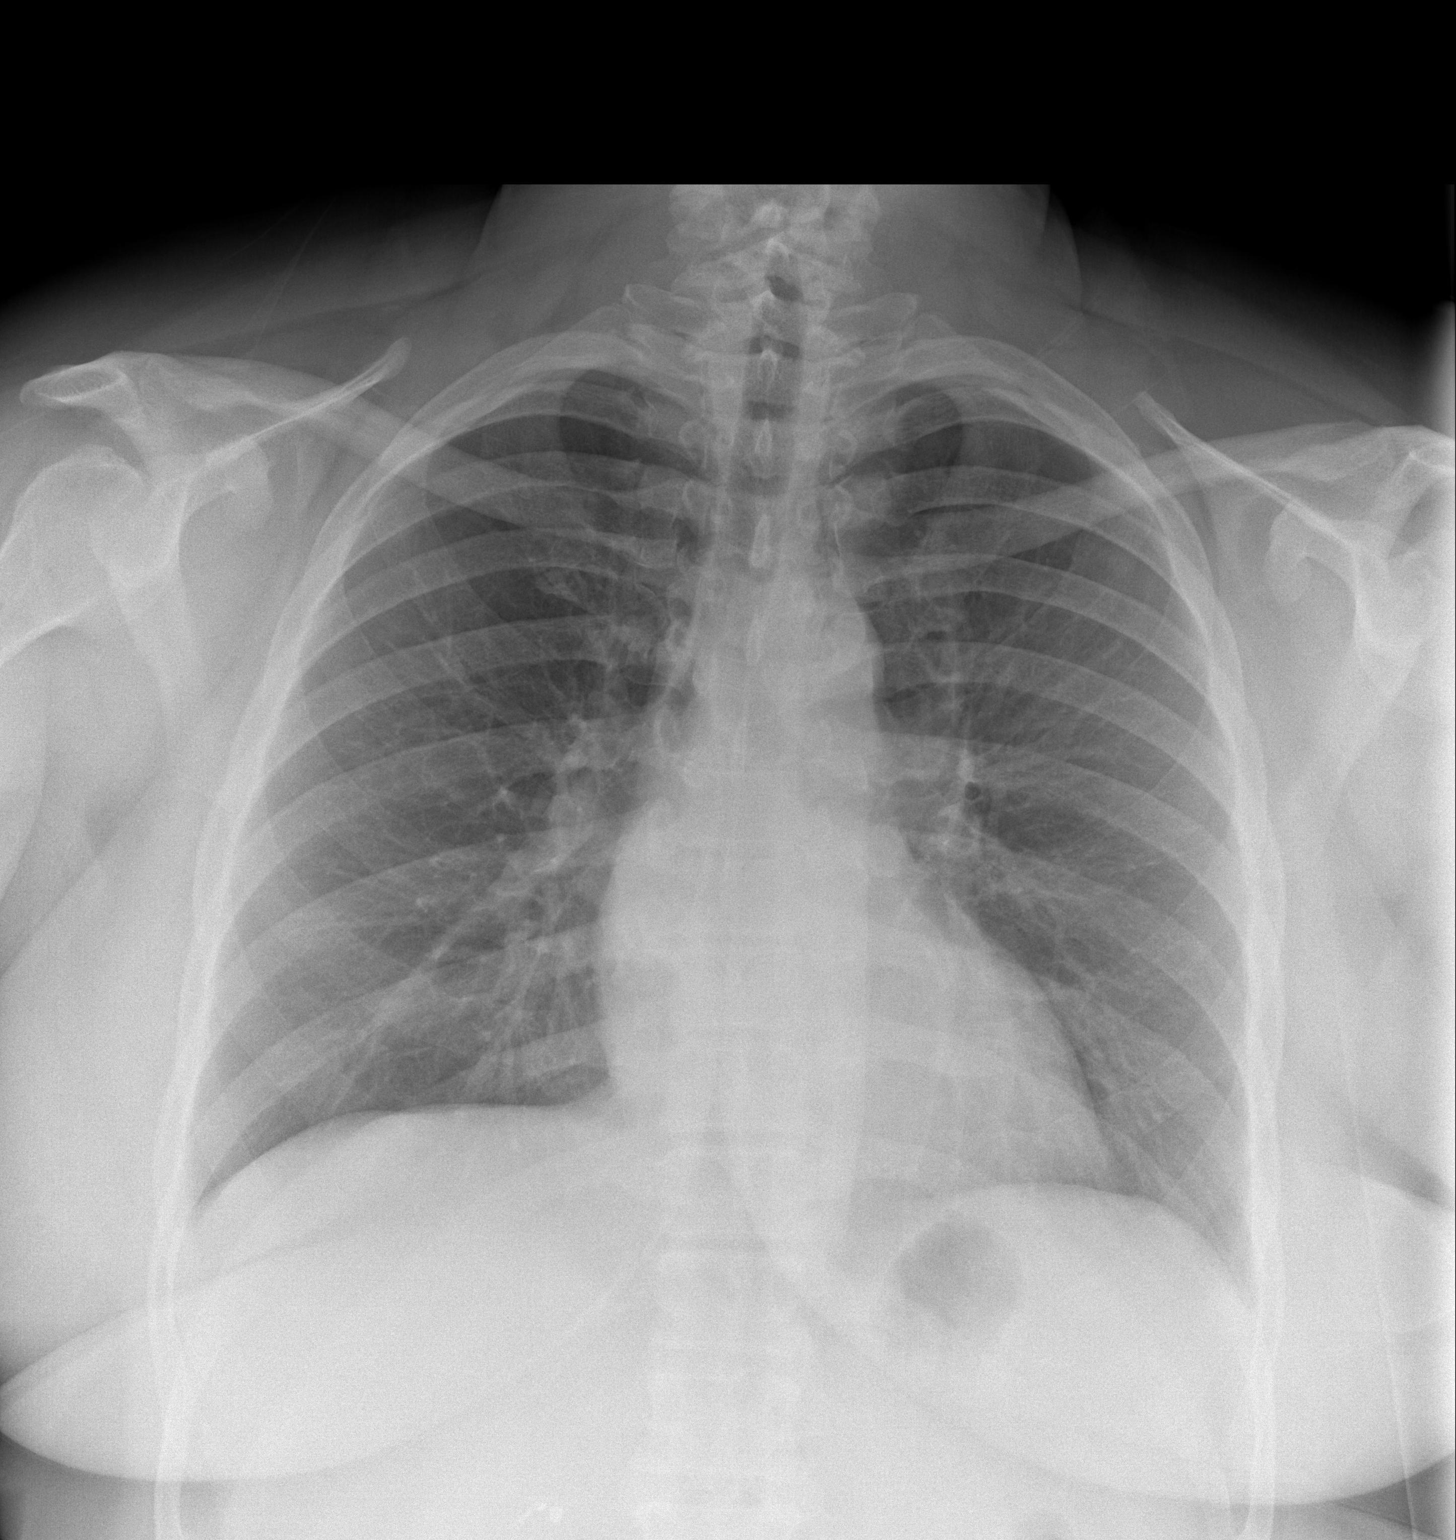

[w chest lat]
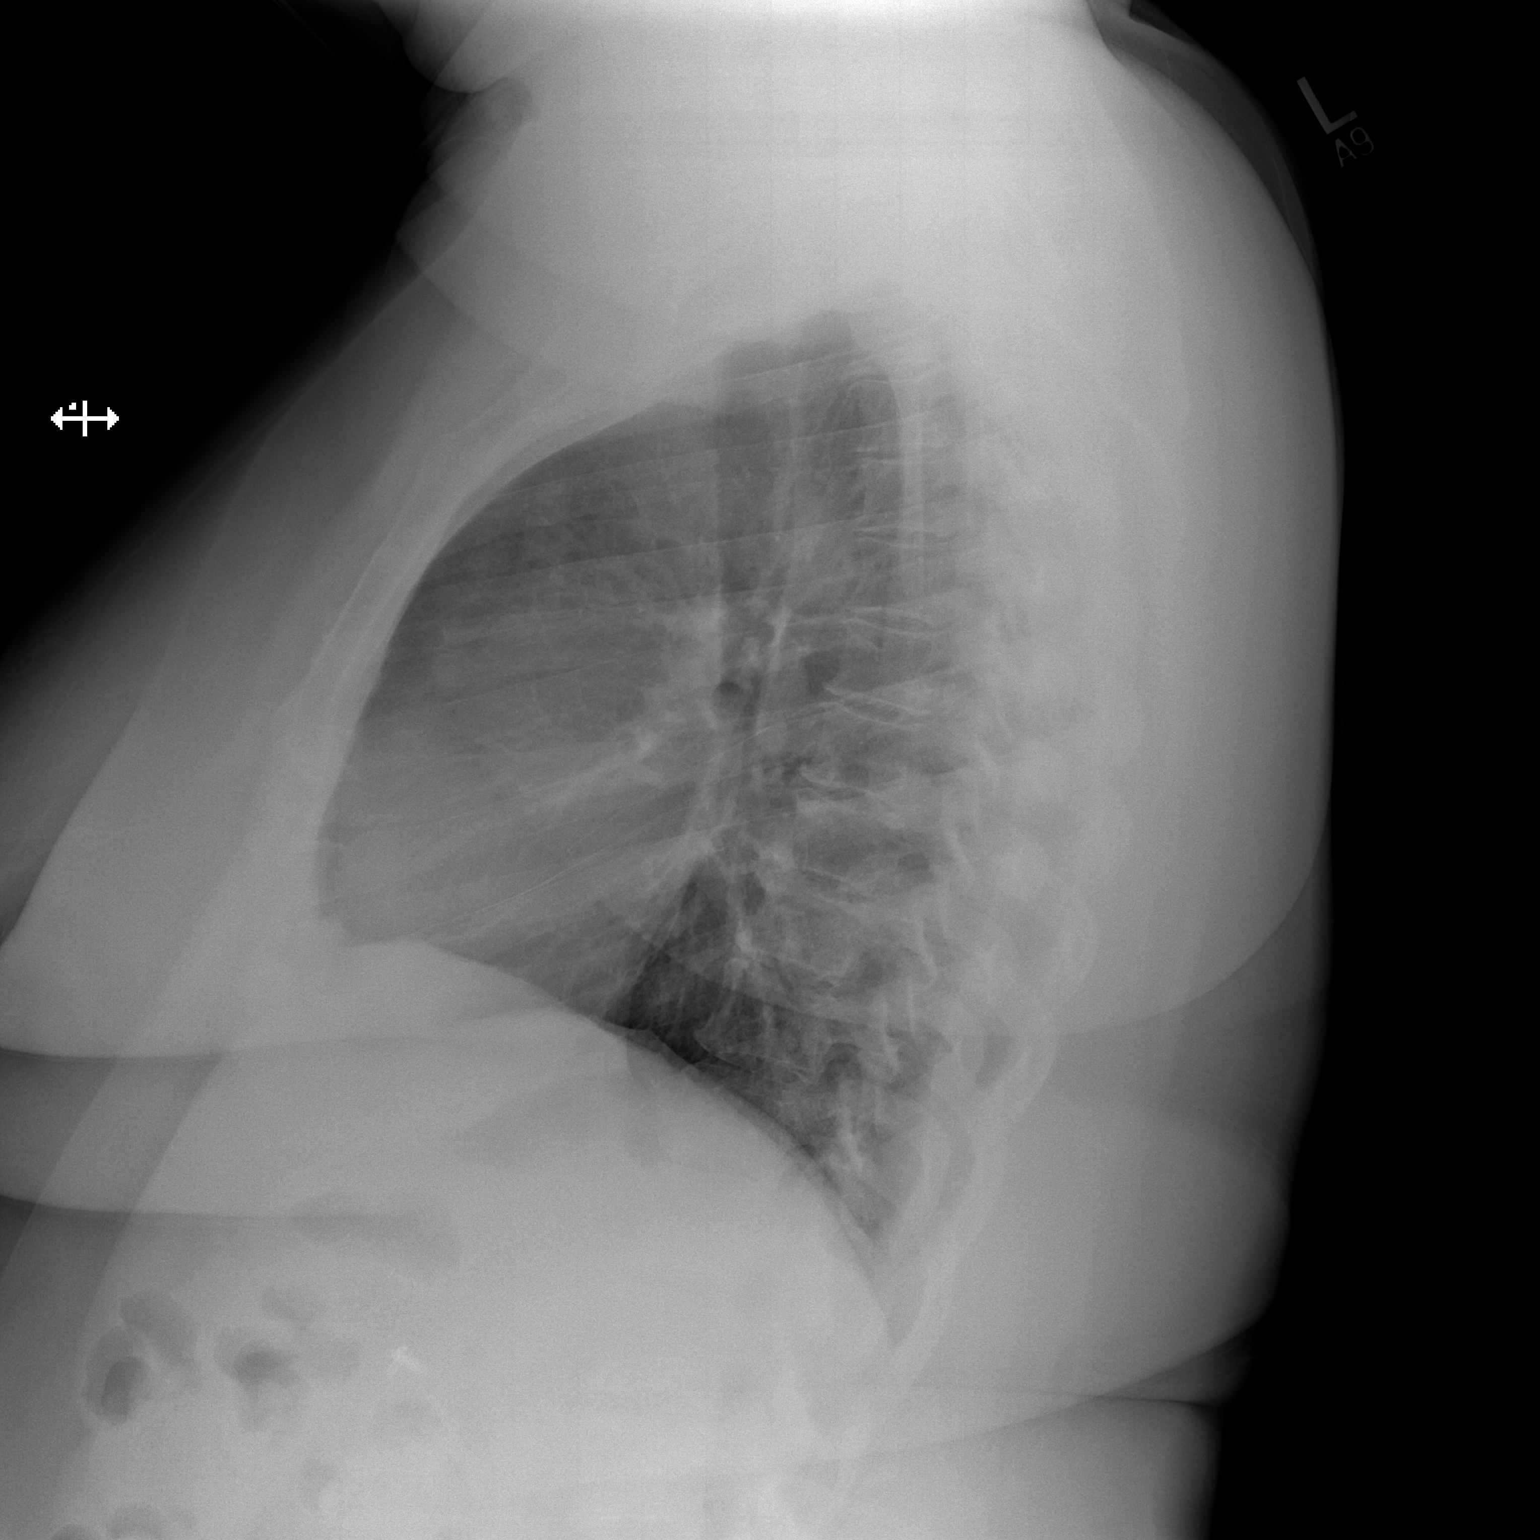

[2 of 2 positions shown; findings below may reference images not displayed]

FINDINGS: The heart size and mediastinal contours are within normal limits.
Both lungs are clear. No pneumothorax or pleural effusion is noted.
The visualized skeletal structures are unremarkable.
IMPRESSION: No active cardiopulmonary disease.

## 2017-11-13 MED ORDER — BENZONATATE 100 MG PO CAPS: 100.0000 mg | ORAL_CAPSULE | Freq: Three times a day (TID) | ORAL | 0 refills | Status: DC

## 2017-11-13 MED ORDER — ALBUTEROL SULFATE HFA 108 (90 BASE) MCG/ACT IN AERS: 1.0000 | INHALATION_SPRAY | Freq: Four times a day (QID) | RESPIRATORY_TRACT | 0 refills | Status: AC | PRN

## 2017-11-13 MED ORDER — LORATADINE 10 MG PO TABS: 10.0000 mg | ORAL_TABLET | Freq: Every day | ORAL | 0 refills | Status: AC

## 2017-11-13 NOTE — ED Triage Notes (Signed)
Patient c/o productive cough x3 weeks. Reports taking clindamycin, amoxicillin, and prednisone as prescribed. Speaking in full sentences without difficulty.

## 2017-11-13 NOTE — ED Provider Notes (Signed)
[facility] DEPT Provider Note   CSN: [id number] Arrival date & time: 11/13/17  1202     History   Chief Complaint Chief Complaint  Patient presents with  . Cough    HPI Mallory Henderson is a 52 y.o. female.  HPI Patient is a 52 year old female who reports ongoing cough over the past 3 weeks without orthopnea or shortness of breath.  She states she is tried albuterol without improvement in her symptoms.  She is on been on several antibiotics including amoxicillin and clindamycin and azithromycin without improvement in her symptoms.  She states her cough persists.  Is now making her chest hurt is coughing so much.  It is keeping her up at night.  No recent sick contacts.  No shortness of breath at rest.  No shortness of breath with exertion.  No history of asthma.  Denies tobacco abuse.   Past Medical History:  Diagnosis Date  . Hypertension     Patient Active Problem List   Diagnosis Date Noted  . Morbid obesity ([place]) 12/21/2015  . Hyperlipidemia 12/21/2015  . Evaluate for Sleep apnea 12/21/2015  . Obesity, unspecified 11/07/2013  . Pure hypercholesterolemia 11/07/2013  . Essential hypertension 11/07/2013    Past Surgical History:  Procedure Laterality Date  . ABDOMINAL SURGERY    . CESAREAN SECTION    . SHOULDER SURGERY    . TUBAL LIGATION       OB History   None      Home Medications    Prior to Admission medications   Medication Sig Start Date End Date Taking? Authorizing Provider  albuterol (PROVENTIL HFA;VENTOLIN HFA) 108 (90 Base) MCG/ACT inhaler Inhale 1-2 puffs into the lungs every 6 (six) hours as needed for wheezing or shortness of breath. 11/13/17   Jola Schmidt, MD  AMITRIPTYLINE HCL PO Take 1 tablet by mouth at bedtime.    [provider]  aspirin EC 81 MG tablet Take 1 tablet (81 mg total) by mouth daily. 06/12/13   Ghimire, Henreitta Leber, MD  benzonatate (TESSALON) 100 MG capsule Take 1 capsule (100 mg total) by  mouth every 8 (eight) hours. 11/13/17   Jola Schmidt, MD  cloNIDine (CATAPRES) 0.1 MG tablet Take 0.1 mg by mouth 2 (two) times daily.    [provider]  furosemide (LASIX) 40 MG tablet Take 1 tablet by mouth daily. 05/14/17   [provider]  loratadine (CLARITIN) 10 MG tablet Take 1 tablet (10 mg total) by mouth daily. 11/13/17   Jola Schmidt, MD  lovastatin (MEVACOR) 40 MG tablet Take 1 tablet by mouth daily. 04/01/17   [provider]  metoprolol tartrate (LOPRESSOR) 100 MG tablet Take 1 tablet by mouth 2 (two) times daily. 05/12/17   [provider]  oxyCODONE-acetaminophen (PERCOCET) 10-325 MG tablet Take 1 tablet by mouth 3 (three) times daily as needed. 05/21/17   [provider]  phentermine 37.5 MG capsule Take 1 capsule by mouth daily. 04/06/17   [provider]    Family History Family History  Problem Relation Age of Onset  . Hypertension Father   . Heart disease Brother   . Kidney disease Brother   . Heart attack Brother   . Diabetes Paternal Grandfather   . Heart disease Paternal Grandfather   . Stroke Maternal Grandmother     Social History Social History   Tobacco Use  . Smoking status: Never Smoker  Substance Use Topics  . Alcohol use: Yes  Comment: wine socially  . Drug use: No     Allergies   Hydrocodone and Latex   Review of Systems Review of Systems  All other systems reviewed and are negative.    Physical Exam Updated Vital Signs BP (!) 146/92 (BP Location: Left Arm)   Pulse 71   Temp 98.5 F (36.9 C) (Oral)   Resp 18   Ht 5\' 2"  (1.575 m)   Wt 115.2 kg (254 lb)   SpO2 99%   BMI 46.46 kg/m   Physical Exam  Constitutional: She is oriented to person, place, and time. She appears well-developed and well-nourished. No distress.  HENT:  Head: Normocephalic and atraumatic.  Posterior pharynx is normal.  Eyes: EOM are normal.  Neck: Normal range of motion.  Cardiovascular: Normal rate,  regular rhythm and normal heart sounds.  Pulmonary/Chest: Effort normal and breath sounds normal.  Abdominal: Soft. She exhibits no distension. There is no tenderness.  Musculoskeletal: Normal range of motion.  Neurological: She is alert and oriented to person, place, and time.  Skin: Skin is warm and dry.  Psychiatric: She has a normal mood and affect. Judgment normal.  Nursing note and vitals reviewed.    ED Treatments / Results  Labs (all labs ordered are listed, but only abnormal results are displayed) Labs Reviewed - No data to display  EKG None  Radiology Dg Chest 2 View  Result Date: 11/13/2017 CLINICAL DATA:  Productive cough, shortness of breath. EXAM: CHEST - 2 VIEW COMPARISON:  Radiographs of Dec 21, 2016. FINDINGS: The heart size and mediastinal contours are within normal limits. Both lungs are clear. No pneumothorax or pleural effusion is noted. The visualized skeletal structures are unremarkable. IMPRESSION: No active cardiopulmonary disease. Electronically Signed   By: Marijo Conception, M.D.   On: 11/13/2017 12:28    Procedures Procedures (including critical care time)  Medications Ordered in ED Medications - No data to display   Initial Impression / Assessment and Plan / ED Course  I have reviewed the triage vital signs and the nursing notes.  Pertinent labs & imaging results that were available during my care of the patient were reviewed by me and considered in my medical decision making (see chart for details).     Suspect seasonal allergies with bronchospasm.  May represent viral bronchitis.  Chest x-ray without pneumonia or other obvious abnormalities.  I personally reviewed the patient's chest x-ray.  Doubt pulmonary embolism.  Doubt presentation of heart failure or ACS.  Overall well-appearing.  Stable for discharge home with Claritin, albuterol, Tessalon [place].  If she is not improving within 7-10 days it is worthwhile for her to call the pulmonary team  for follow-up.  Close primary care follow-up.  Patient understands to return to the emergency department for new or worsening symptoms  Final Clinical Impressions(s) / ED Diagnoses   Final diagnoses:  Viral URI with cough    ED Discharge Orders        Ordered    benzonatate (TESSALON) 100 MG capsule  Every 8 hours     11/13/17 1315    albuterol (PROVENTIL HFA;VENTOLIN HFA) 108 (90 Base) MCG/ACT inhaler  Every 6 hours PRN     11/13/17 1315    loratadine (CLARITIN) 10 MG tablet  Daily     11/13/17 [place], Allysson Rinehimer, MD 11/13/17 1317

## 2017-11-28 ENCOUNTER — Encounter: Payer: Self-pay | Admitting: Internal Medicine

## 2018-01-09 ENCOUNTER — Other Ambulatory Visit: Payer: Self-pay

## 2018-02-07 ENCOUNTER — Encounter: Payer: BLUE CROSS/BLUE SHIELD | Admitting: Internal Medicine

## 2018-03-05 ENCOUNTER — Ambulatory Visit (HOSPITAL_COMMUNITY)
Admission: EM | Admit: 2018-03-05 | Discharge: 2018-03-05 | Disposition: A | Discharge status: Home / Self Care | Payer: BLUE CROSS/BLUE SHIELD | Attending: Family Medicine | Admitting: Family Medicine

## 2018-03-05 ENCOUNTER — Encounter (HOSPITAL_COMMUNITY): Payer: Self-pay | Admitting: Emergency Medicine

## 2018-03-05 ENCOUNTER — Other Ambulatory Visit: Payer: Self-pay

## 2018-03-05 ENCOUNTER — Ambulatory Visit (INDEPENDENT_AMBULATORY_CARE_PROVIDER_SITE_OTHER): Payer: BLUE CROSS/BLUE SHIELD

## 2018-03-05 DIAGNOSIS — M25551 Pain in right hip: Secondary | ICD-10-CM

## 2018-03-05 DIAGNOSIS — M79671 Pain in right foot: Secondary | ICD-10-CM

## 2018-03-05 DIAGNOSIS — M25531 Pain in right wrist: Secondary | ICD-10-CM | POA: Diagnosis not present

## 2018-03-05 DIAGNOSIS — W19XXXA Unspecified fall, initial encounter: Secondary | ICD-10-CM | POA: Diagnosis not present

## 2018-03-05 IMAGING — DX DG HIP (WITH OR WITHOUT PELVIS) 1V*R*
3 series · 3 of 3 positions shown · non-contrast
Comparison: None.

CLINICAL DATA: Fall in bathroom last night with right hip pain.

EXAM:
DG HIP (WITH OR WITHOUT PELVIS) 1V RIGHT

[pelvis ap]
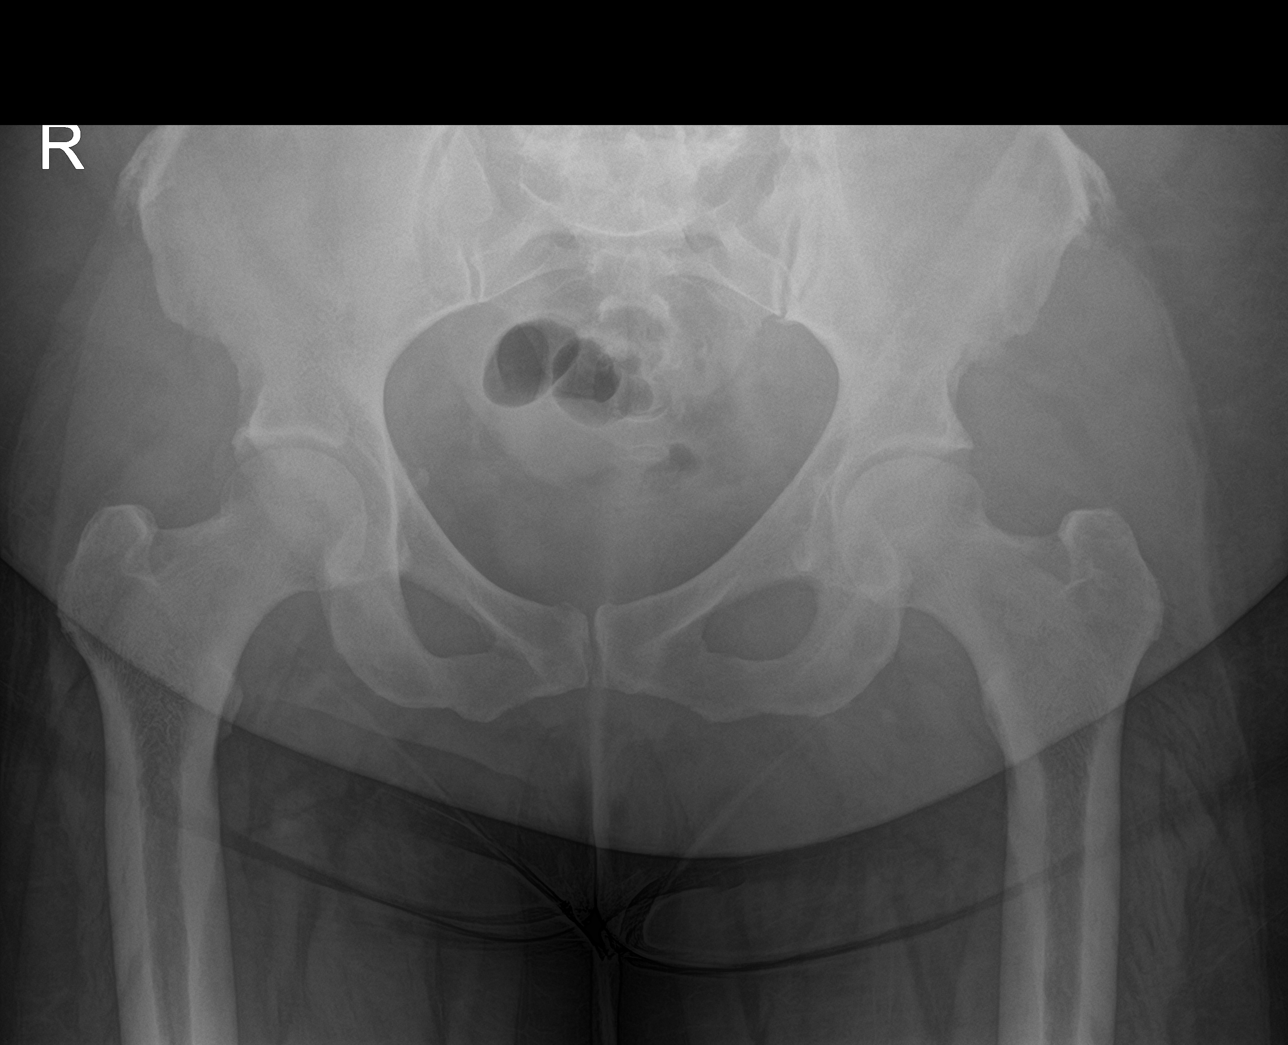

[hip ap]
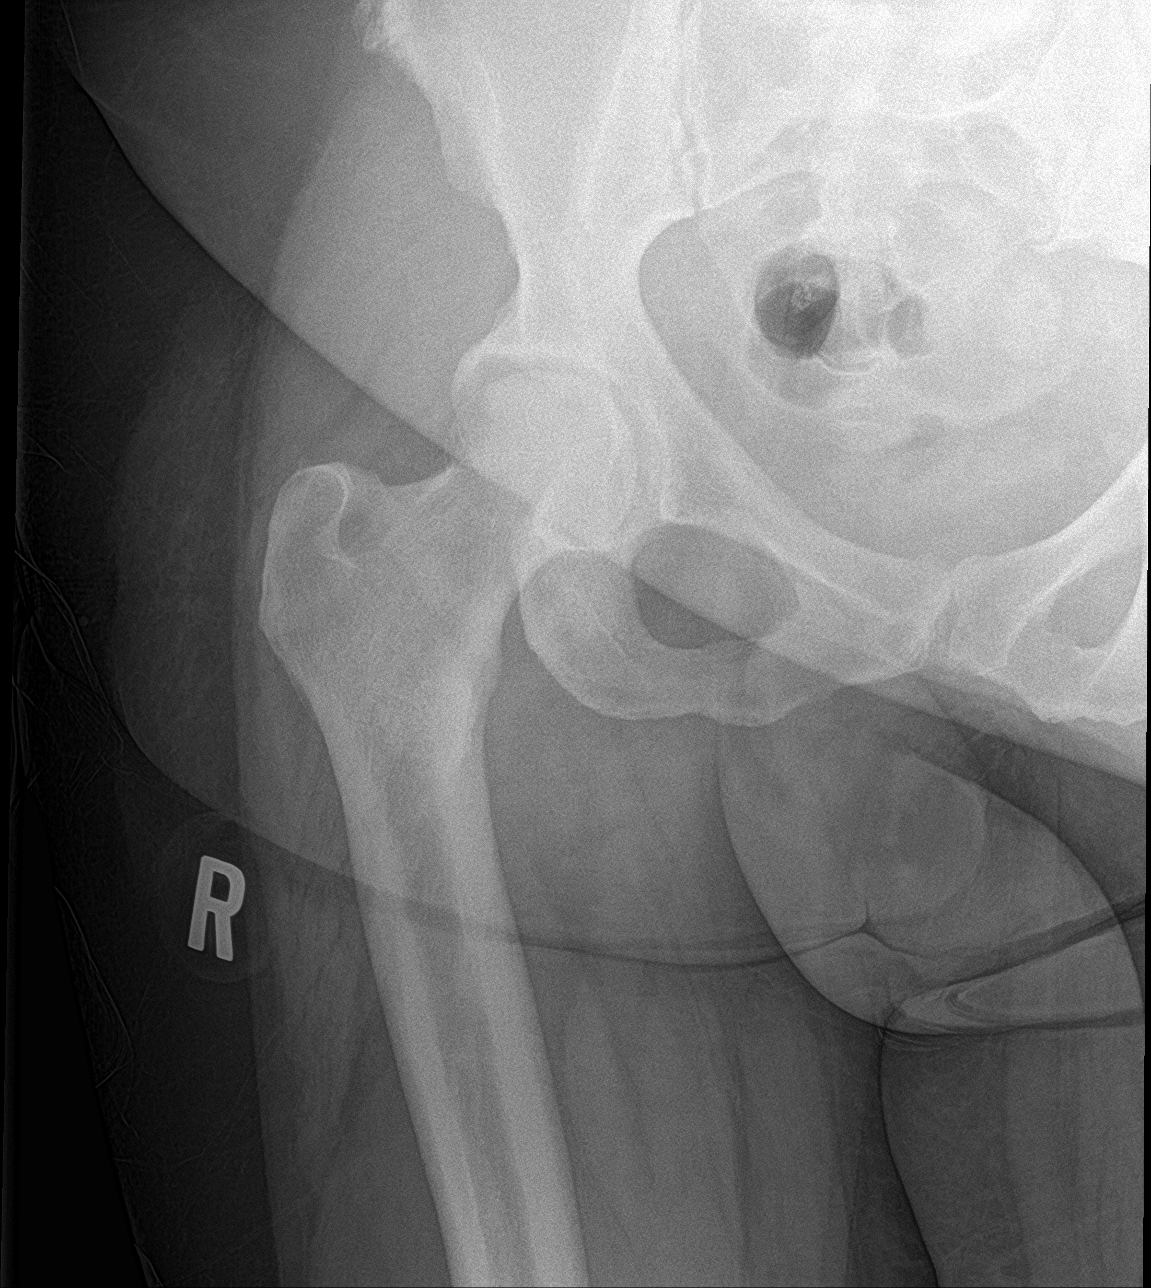

[hip lat]
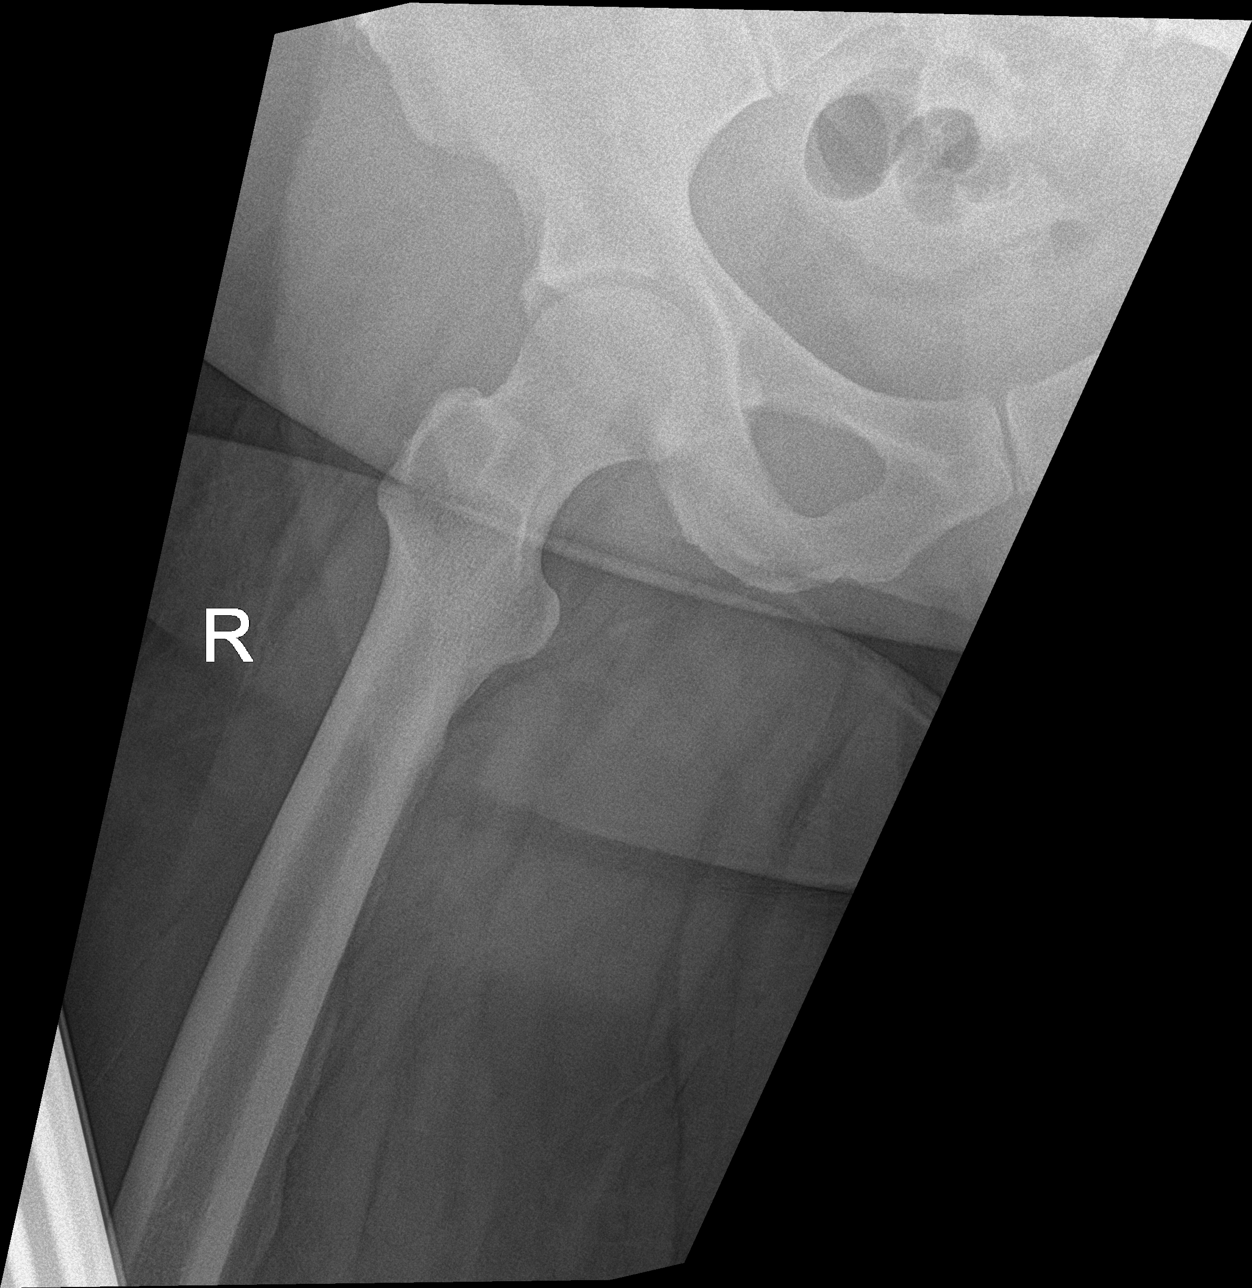

[3 of 3 positions shown; findings below may reference images not displayed]

FINDINGS: There is no evidence of hip fracture or dislocation. There is no
evidence of arthropathy or other focal bone abnormality.
IMPRESSION: Negative.

## 2018-03-05 IMAGING — DX DG OS CALCIS 2+V*R*
2 series · 2 of 2 positions shown · non-contrast
Comparison: None.

CLINICAL DATA: Fell in bathroom with pain in the right heel.

EXAM:
RIGHT OS CALCIS - 2+ VIEW

[calcaneus axial]
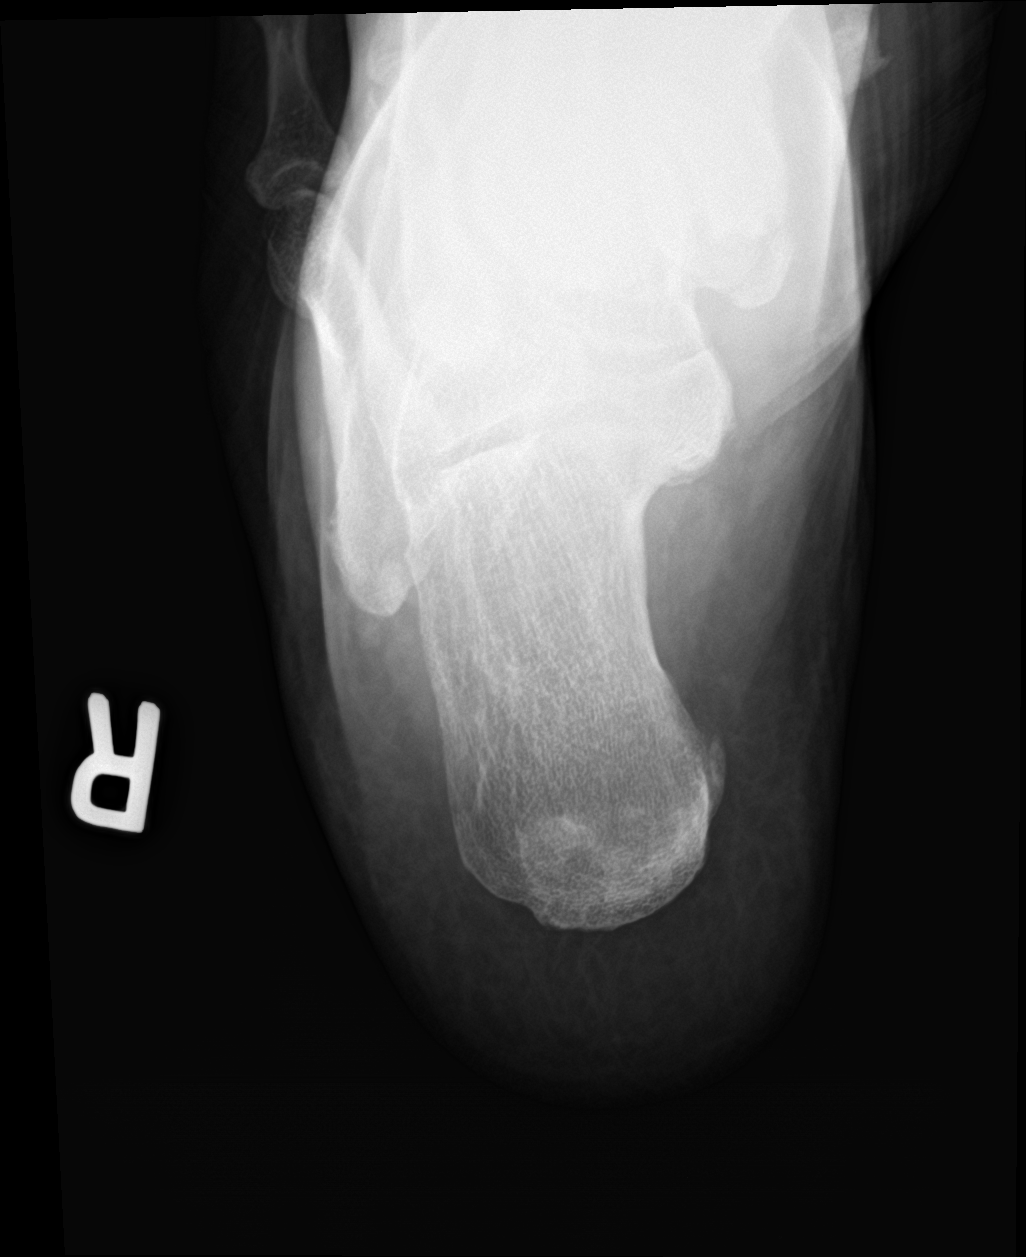

[calcaneus lat]
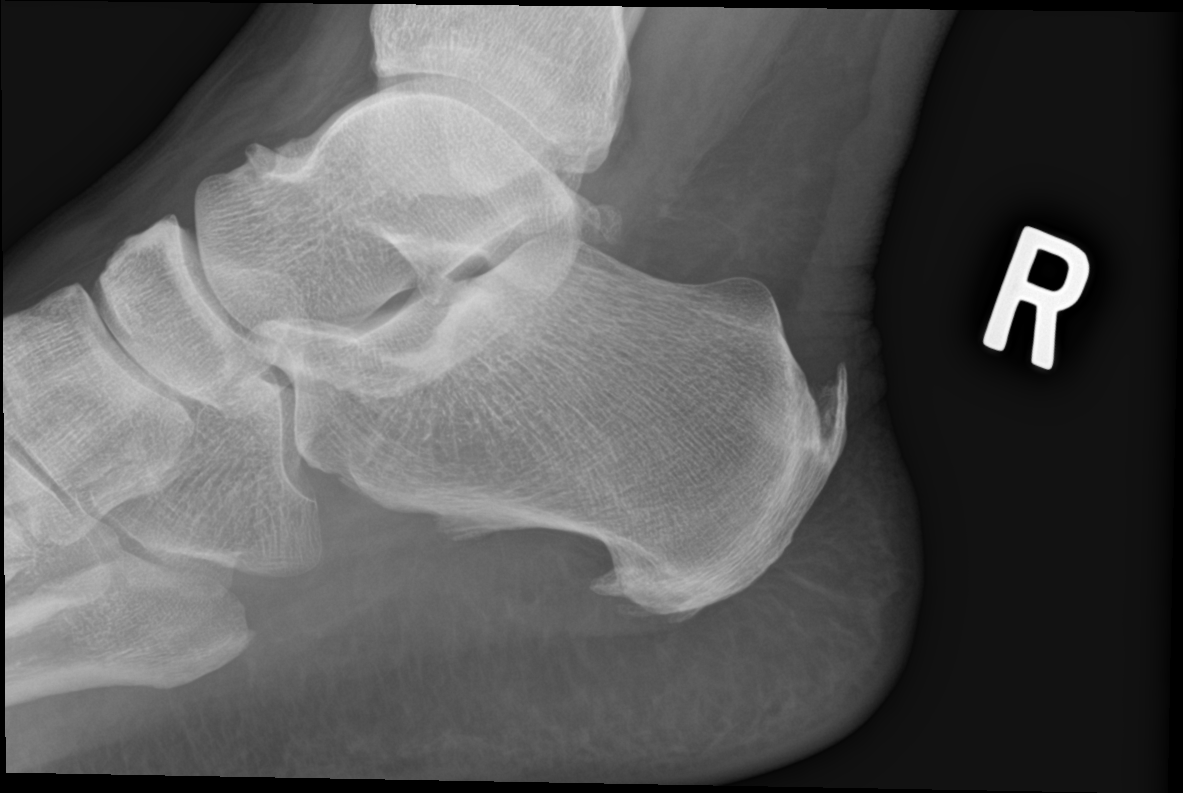

[2 of 2 positions shown; findings below may reference images not displayed]

FINDINGS: No evidence for a calcaneal fracture. Normal appearance of the
subtalar joint. Prominent calcaneal spurs along the plantar aspect
and at the insertion of the Achilles tendon.
IMPRESSION: No acute bone abnormality to the right heel.

Prominent calcaneal spurs.

## 2018-03-05 MED ORDER — CYCLOBENZAPRINE HCL 5 MG PO TABS: 5.0000 mg | ORAL_TABLET | Freq: Three times a day (TID) | ORAL | 0 refills | Status: DC | PRN

## 2018-03-05 MED ORDER — KETOROLAC TROMETHAMINE 60 MG/2ML IM SOLN
INTRAMUSCULAR | Status: AC
  Filled 2018-03-05: qty 2

## 2018-03-05 MED ORDER — KETOROLAC TROMETHAMINE 60 MG/2ML IM SOLN
60.0000 mg | Freq: Once | INTRAMUSCULAR | Status: AC
  Administered 2018-03-05: 60 mg via INTRAMUSCULAR

## 2018-03-05 NOTE — ED Provider Notes (Signed)
[facility]    CSN: [id number] Arrival date & time: 03/05/18  1811     History   Chief Complaint Chief Complaint  Patient presents with  . Fall    HPI Mallory Henderson is a 52 y.o. female.   HPI  Patient states she fell in her kitchen yesterday.  She feels the floor may have been wet from when she cleaning the oven.  Her feet "went out from under me" and she landed on her right side.  Her right heel, right hip, and right wrist are all painful.  She has pain with weightbearing on the right foot.  She states she has to walk barely toe touching on the right foot.  She was unable to work today. No prior fractures or problems with the right hip or foot. Patient takes oxycodone 3 times a day for chronic back pain.  She states even with the oxycodone her pain is "severe".   Past Medical History:  Diagnosis Date  . Hypertension     Patient Active Problem List   Diagnosis Date Noted  . Morbid obesity ([place]) 12/21/2015  . Hyperlipidemia 12/21/2015  . Evaluate for Sleep apnea 12/21/2015  . Obesity, unspecified 11/07/2013  . Pure hypercholesterolemia 11/07/2013  . Essential hypertension 11/07/2013    Past Surgical History:  Procedure Laterality Date  . ABDOMINAL SURGERY    . CESAREAN SECTION    . SHOULDER SURGERY    . TUBAL LIGATION      OB History   None      Home Medications    Prior to Admission medications   Medication Sig Start Date End Date Taking? Authorizing Provider  meloxicam (MOBIC) 15 MG tablet Take 15 mg by mouth daily.   Yes [provider]  albuterol (PROVENTIL HFA;VENTOLIN HFA) 108 (90 Base) MCG/ACT inhaler Inhale 1-2 puffs into the lungs every 6 (six) hours as needed for wheezing or shortness of breath. 11/13/17   Jola Schmidt, MD  cloNIDine (CATAPRES) 0.1 MG tablet Take 0.1 mg by mouth 2 (two) times daily.    [provider]  cyclobenzaprine (FLEXERIL) 5 MG tablet Take 1 tablet (5 mg total) by mouth 3 (three) times daily as  needed for muscle spasms. 03/05/18   Raylene Everts, MD  furosemide (LASIX) 40 MG tablet Take 1 tablet by mouth daily. 05/14/17   [provider]  loratadine (CLARITIN) 10 MG tablet Take 1 tablet (10 mg total) by mouth daily. 11/13/17   Jola Schmidt, MD  lovastatin (MEVACOR) 40 MG tablet Take 1 tablet by mouth daily. 04/01/17   [provider]  metoprolol tartrate (LOPRESSOR) 100 MG tablet Take 1 tablet by mouth 2 (two) times daily. 05/12/17   [provider]  oxyCODONE-acetaminophen (PERCOCET) 10-325 MG tablet Take 1 tablet by mouth 3 (three) times daily as needed. 05/21/17   [provider]    Family History Family History  Problem Relation Age of Onset  . Hypertension Father   . Heart disease Brother   . Kidney disease Brother   . Heart attack Brother   . Diabetes Paternal Grandfather   . Heart disease Paternal Grandfather   . Stroke Maternal Grandmother     Social History Social History   Tobacco Use  . Smoking status: Never Smoker  Substance Use Topics  . Alcohol use: Yes    Comment: wine socially  . Drug use: No     Allergies   Hydrocodone and Latex   Review of Systems Review  of Systems  Constitutional: Negative for chills and fever.  HENT: Negative for ear pain and sore throat.   Eyes: Negative for pain and visual disturbance.  Respiratory: Negative for cough and shortness of breath.   Cardiovascular: Negative for chest pain and palpitations.  Gastrointestinal: Negative for abdominal pain and vomiting.  Genitourinary: Negative for dysuria and hematuria.  Musculoskeletal: Positive for arthralgias and gait problem. Negative for back pain.  Skin: Negative for color change and rash.  Neurological: Negative for seizures and syncope.  All other systems reviewed and are negative.    Physical Exam Triage Vital Signs ED Triage Vitals  Enc Vitals Group     BP 03/05/18 1929 (!) 157/83     Pulse Rate 03/05/18 1929 66     Resp  03/05/18 1929 20     Temp 03/05/18 1929 97.8 F (36.6 C)     Temp Source 03/05/18 1929 Oral     SpO2 03/05/18 1929 99 %     Weight --      Height --      Head Circumference --      Peak Flow --      Pain Score 03/05/18 1924 10     Pain Loc --      Pain Edu? --      Excl. in [place]? --    No data found.  Updated Vital Signs BP (!) 157/83 (BP Location: Right Arm) Comment (BP Location): regular cuff on right forearm  Pulse 66   Temp 97.8 F (36.6 C) (Oral)   Resp 20   SpO2 99%   Visual Acuity Right Eye Distance:   Left Eye Distance:   Bilateral Distance:    Right Eye Near:   Left Eye Near:    Bilateral Near:     Physical Exam  Constitutional: She appears well-developed and well-nourished. No distress.  Obese.  Appears uncomfortable.  Sits on left hip in wheelchair.  HENT:  Head: Normocephalic and atraumatic.  Mouth/Throat: Oropharynx is clear and moist.  Eyes: Pupils are equal, round, and reactive to light. Conjunctivae are normal.  Neck: Normal range of motion.  Cardiovascular: Normal rate, regular rhythm and normal heart sounds.  Pulmonary/Chest: Effort normal and breath sounds normal. No respiratory distress.  Abdominal: Soft. She exhibits no distension.  Musculoskeletal: Normal range of motion. She exhibits no edema.  Right wrist has full range of motion.  No bony tenderness.  Diffuse mild tenderness over the carpal bones.  No swelling or discoloration.  Right hip is tender over the greater trochanter.  Pain with any range of motion.  Right heel is tender to squeeze pressure of the calcaneus.  Mild tenderness over the plantar surface of the heel.  No swelling or discoloration.  Neurological: She is alert.  Skin: Skin is warm and dry.     UC Treatments / Results  Labs (all labs ordered are listed, but only abnormal results are displayed) Labs Reviewed - No data to display  EKG None  Radiology Dg Os Calcis Right  Result Date: 03/05/2018 CLINICAL DATA:  [place] in  bathroom with pain in the right heel. EXAM: RIGHT OS CALCIS - 2+ VIEW COMPARISON:  None. FINDINGS: No evidence for a calcaneal fracture. Normal appearance of the subtalar joint. Prominent calcaneal spurs along the plantar aspect and at the insertion of the Achilles tendon. IMPRESSION: No acute bone abnormality to the right heel. Prominent calcaneal spurs. Electronically Signed   By: Markus Daft M.D.   On: 03/05/2018 20:01  Dg Hip Unilat W Or Wo Pelvis 1 View Right  Result Date: 03/05/2018 CLINICAL DATA:  Fall in bathroom last night with right hip pain. EXAM: DG HIP (WITH OR WITHOUT PELVIS) 1V RIGHT COMPARISON:  None. FINDINGS: There is no evidence of hip fracture or dislocation. There is no evidence of arthropathy or other focal bone abnormality. IMPRESSION: Negative. Electronically Signed   By: Marin Olp M.D.   On: 03/05/2018 19:58    Procedures Procedures (including critical care time)  Medications Ordered in UC Medications  ketorolac (TORADOL) injection 60 mg (60 mg Intramuscular Given 03/05/18 2027)    Initial Impression / Assessment and Plan / UC Course  I have reviewed the triage vital signs and the nursing notes.  Pertinent labs & imaging results that were available during my care of the patient were reviewed by me and considered in my medical decision making (see chart for details).     Discussed with patient that her wrist  physical examination did not warrant x-rays.  I feel it is just a sprain.  Patient was reassured she has negative x-rays of her hip and her heel.  Discussed conservative care of her bruises and injuries.  Ice.  Rest.  Continue oxycodone.  Continue meloxicam.  Take Flexeril if needed for muscle relaxers.  She is given 2 days off work.  Return if she fails to improve. Final Clinical Impressions(s) / UC Diagnoses   Final diagnoses:  Fall, initial encounter  Pain in right hip  Pain of right heel     Discharge Instructions     Continue Mobic Continue  oxycodone for pain Rest and stay off your feet Use ice to painful areas Take Flexeril as needed for spasms of muscles Follow-up your with your doctor if not better towards the end of the week   ED Prescriptions    Medication Sig Dispense Auth. Provider   cyclobenzaprine (FLEXERIL) 5 MG tablet Take 1 tablet (5 mg total) by mouth 3 (three) times daily as needed for muscle spasms. 30 tablet Raylene Everts, MD     Controlled Substance Prescriptions [place] Controlled Substance Registry consulted? Not Applicable   Raylene Everts, MD 03/05/18 2103

## 2018-03-05 NOTE — Discharge Instructions (Signed)
Continue Mobic Continue oxycodone for pain Rest and stay off your feet Use ice to painful areas Take Flexeril as needed for spasms of muscles Follow-up your with your doctor if not better towards the end of the week

## 2018-03-05 NOTE — ED Triage Notes (Signed)
Patient fell last night.  Patient slipped in cleaning chemical on floor last night.  Right back down to toes is hurting.

## 2018-03-06 ENCOUNTER — Telehealth (HOSPITAL_COMMUNITY): Payer: Self-pay | Admitting: Emergency Medicine

## 2018-03-06 MED ORDER — CYCLOBENZAPRINE HCL 5 MG PO TABS: 5.0000 mg | ORAL_TABLET | Freq: Three times a day (TID) | ORAL | 0 refills | Status: DC | PRN

## 2018-03-06 NOTE — Telephone Encounter (Signed)
Pt called request RX to be sent to [facility] on [address]

## 2018-10-24 ENCOUNTER — Other Ambulatory Visit: Payer: Self-pay | Admitting: Internal Medicine

## 2018-10-24 DIAGNOSIS — Z1231 Encounter for screening mammogram for malignant neoplasm of breast: Secondary | ICD-10-CM

## 2019-01-07 ENCOUNTER — Other Ambulatory Visit: Payer: Self-pay

## 2019-01-07 ENCOUNTER — Ambulatory Visit (HOSPITAL_COMMUNITY)
Admission: EM | Admit: 2019-01-07 | Discharge: 2019-01-07 | Disposition: A | Discharge status: Home / Self Care | Payer: BLUE CROSS/BLUE SHIELD | Attending: Family Medicine | Admitting: Family Medicine

## 2019-01-07 ENCOUNTER — Encounter (HOSPITAL_COMMUNITY): Payer: Self-pay

## 2019-01-07 DIAGNOSIS — M5441 Lumbago with sciatica, right side: Secondary | ICD-10-CM

## 2019-01-07 DIAGNOSIS — M549 Dorsalgia, unspecified: Secondary | ICD-10-CM

## 2019-01-07 MED ORDER — MELOXICAM 15 MG PO TABS: 15.0000 mg | ORAL_TABLET | Freq: Every day | ORAL | 0 refills | Status: DC

## 2019-01-07 MED ORDER — TIZANIDINE HCL 4 MG PO TABS
4.0000 mg | ORAL_TABLET | Freq: Four times a day (QID) | ORAL | 0 refills | Status: DC | PRN
Start: 2019-01-07 — End: 2020-04-15
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
## 2019-01-07 NOTE — Discharge Instructions (Signed)
Try to avoid bending and heavy lifting Use ice or heat to your back for pain relief Take Mobic once a day.  Take with food.  This is an anti-inflammatory medication. Take tizanidine as needed muscle relaxer.  This is useful at bedtime. Expect improvement over next few days.  Follow-up with your primary care doctor if you fail to get better
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
## 2019-07-07 ENCOUNTER — Ambulatory Visit (INDEPENDENT_AMBULATORY_CARE_PROVIDER_SITE_OTHER): Payer: BLUE CROSS/BLUE SHIELD

## 2019-07-07 ENCOUNTER — Ambulatory Visit (HOSPITAL_COMMUNITY)
Admission: EM | Admit: 2019-07-07 | Discharge: 2019-07-07 | Disposition: A | Discharge status: Home / Self Care | Payer: BLUE CROSS/BLUE SHIELD | Attending: Family Medicine | Admitting: Family Medicine

## 2019-07-07 ENCOUNTER — Encounter (HOSPITAL_COMMUNITY): Payer: Self-pay

## 2019-07-07 ENCOUNTER — Other Ambulatory Visit: Payer: Self-pay

## 2019-07-07 DIAGNOSIS — S6992XA Unspecified injury of left wrist, hand and finger(s), initial encounter: Secondary | ICD-10-CM

## 2019-07-07 DIAGNOSIS — I10 Essential (primary) hypertension: Secondary | ICD-10-CM | POA: Diagnosis not present

## 2019-07-07 DIAGNOSIS — S60212A Contusion of left wrist, initial encounter: Secondary | ICD-10-CM | POA: Diagnosis not present

## 2019-07-07 IMAGING — DX DG WRIST COMPLETE 3+V*L*
4 series · 4 of 4 positions shown · non-contrast
Comparison: None.

CLINICAL DATA: Left wrist pain and swelling secondary to a fall 2
days ago. Limited range of motion.

EXAM:
LEFT WRIST - COMPLETE 3+ VIEW

[wrist pa]
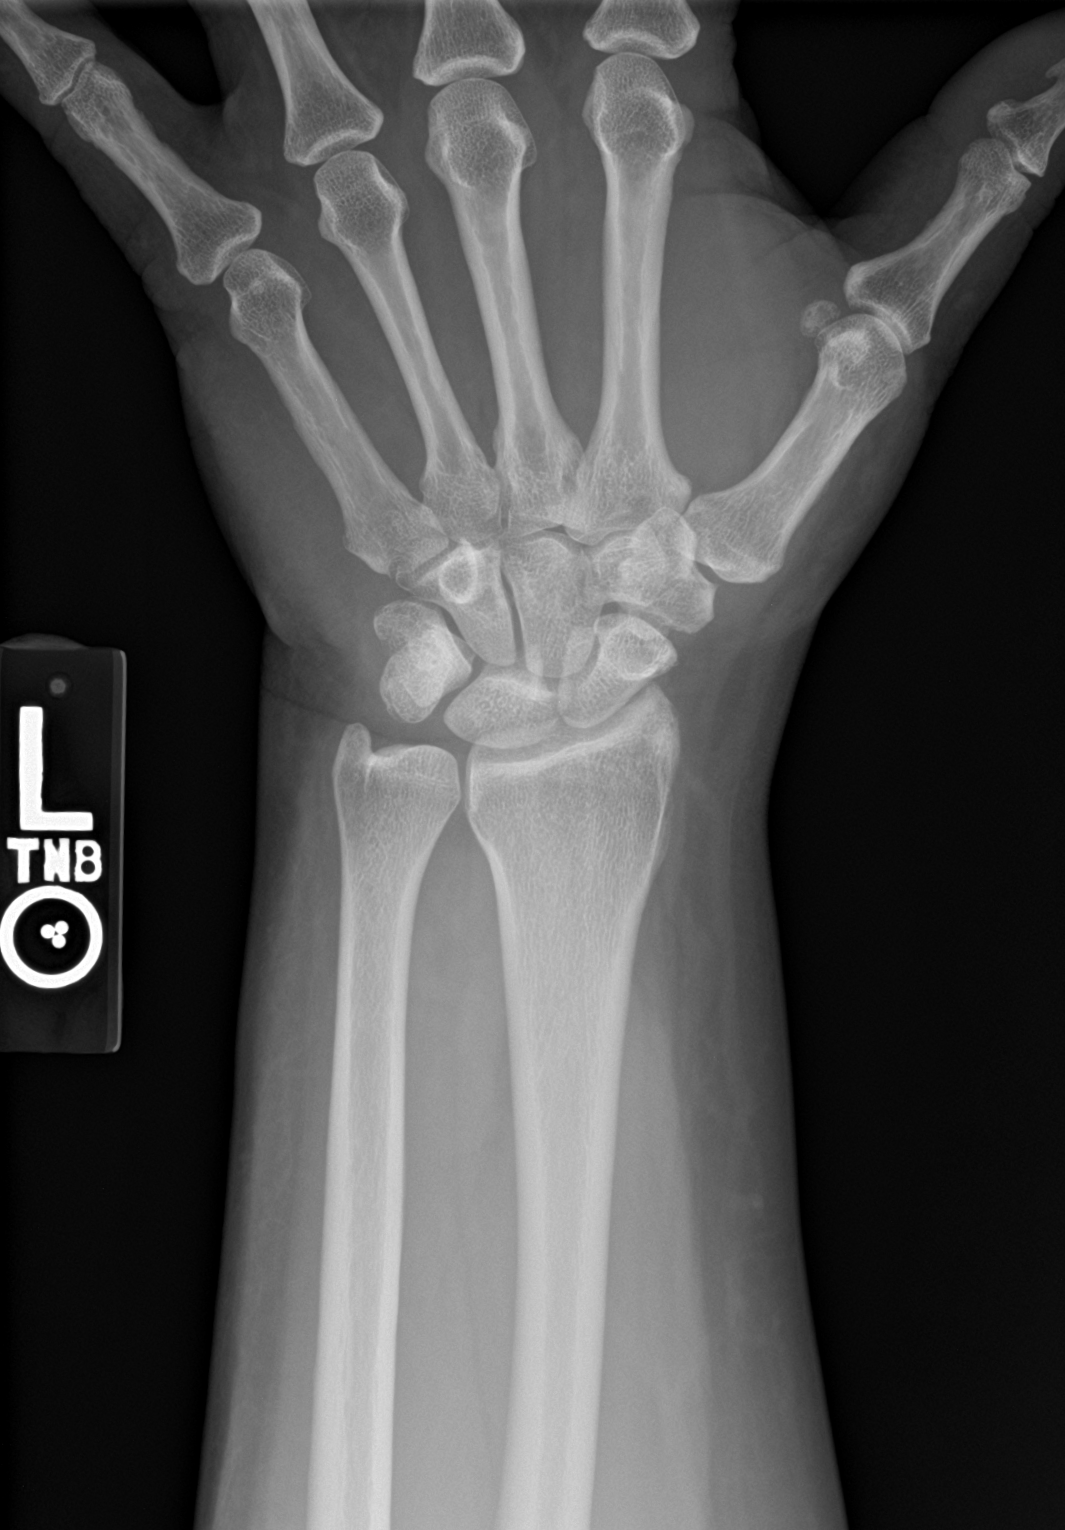

[wrist navicular]
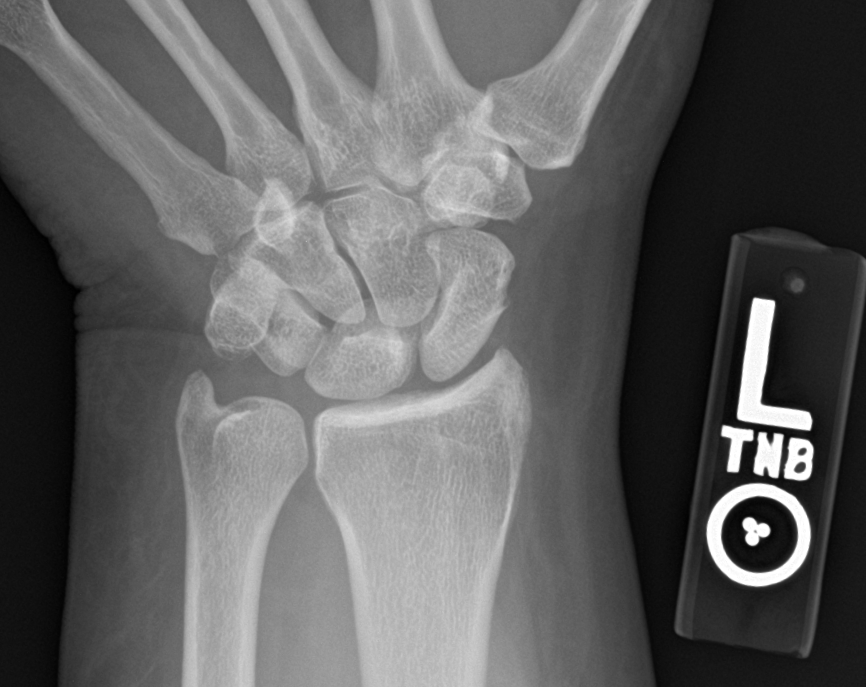

[wrist obl]
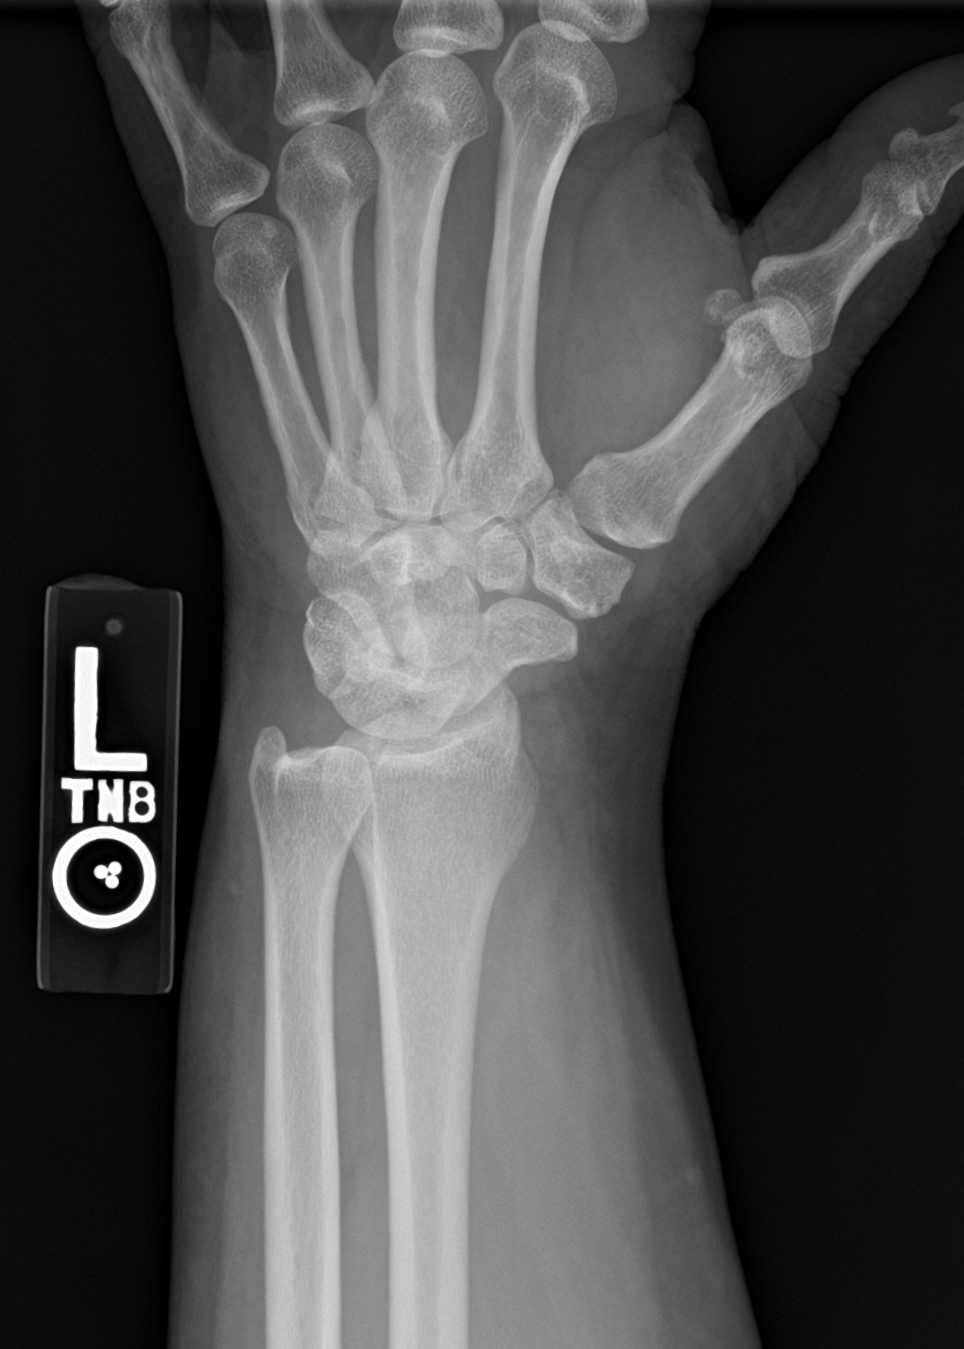

[wrist lat]
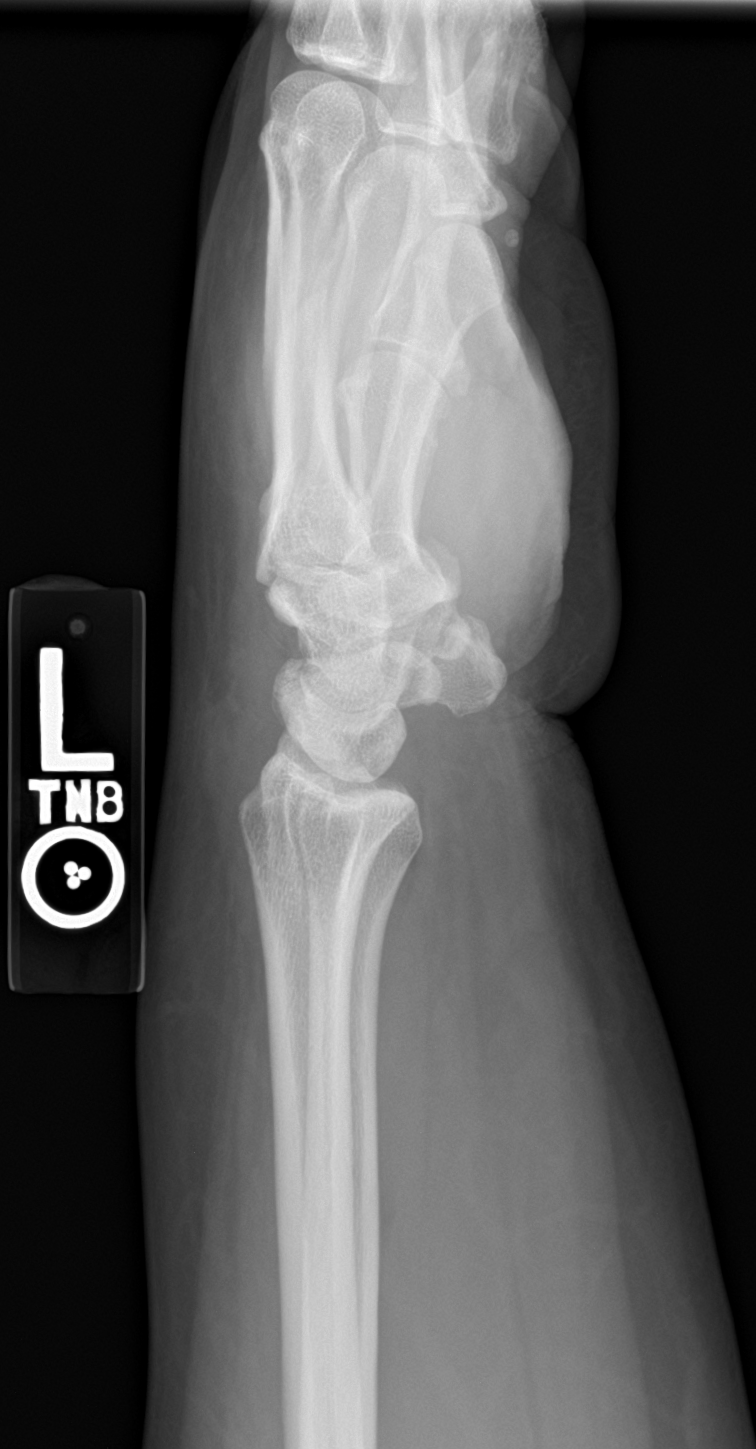

[4 of 4 positions shown; findings below may reference images not displayed]

FINDINGS: There is no evidence of fracture or dislocation. There is no
evidence of arthropathy or other focal bone abnormality. Soft
tissues are unremarkable.
IMPRESSION: Negative.

## 2019-07-07 NOTE — ED Triage Notes (Signed)
Pt fall on Saturday morning around 3 am, she tired to break her fall but landed left hand. Pt hand is swelling and feel like a IV is in her hand

## 2019-07-07 NOTE — ED Provider Notes (Signed)
[place]   [id number] 07/07/19 Arrival Time: [id number]  ASSESSMENT & PLAN:  1. Wrist injury, left, initial encounter   2. Contusion of left wrist, initial encounter   3. Essential hypertension     I have personally viewed the imaging studies ordered this visit. No fractures appreciated.  Placed in thumb spica splint. Prefers OTC analgesics if needed.  Recommend:  Follow-up Information    [place].   Why: If worsening or failing to improve as anticipated. Contact information: [address] [place] [phone]          Recommend following up with PCP or here to recheck BP. She voices understanding.  Reviewed expectations re: course of current medical issues. Questions answered. Outlined signs and symptoms indicating need for more acute intervention. Patient verbalized understanding. After Visit Summary given.  SUBJECTIVE: History from: patient. Mallory Henderson is a 53 y.o. female who reports fairly persistent moderate pain of her left wrist; described as aching; without radiation. Onset: abrupt. First noted: approx 48 hours ago. Injury/trama: reports fall at home "and I hit my wrist on something"; immediate pain. Symptoms have progressed to a point and plateaued since beginning. Aggravating factors: certain movements and grasping. Alleviating factors: "holding it still". Associated symptoms: none reported. Extremity sensation changes or weakness: none. Self treatment: NSAID, with minimal relief.  History of similar: no.  Past Surgical History:  Procedure Laterality Date  . ABDOMINAL SURGERY    . CESAREAN SECTION    . SHOULDER SURGERY    . TUBAL LIGATION      Increased blood pressure noted today. Reports that she is treated for HTN.  She reports taking medications as instructed, no medication side effects noted, no chest pain on exertion, no dyspnea on exertion, no swelling of ankles, no  orthostatic dizziness or lightheadedness, no orthopnea or paroxysmal nocturnal dyspnea, no palpitations and no intermittent claudication symptoms.  ROS: As per HPI. All other systems negative.    OBJECTIVE:  Vitals:   07/07/19 1318  BP: (!) 126/103  Pulse: 70  Resp: 20  Temp: 98.2 F (36.8 C)  TempSrc: Oral    General appearance: alert; no distress HEENT: [place]; AT Neck: supple with FROM CV: regular Resp: unlabored respirations Extremities: . LUE: warm with well perfused appearance; fairly well localized moderate tenderness over left radial wrist; without gross deformities; swelling: none; bruising: none; wrist ROM: normal, with discomfort CV: brisk extremity capillary refill of LUE; 2+ radial pulse of LUE. Skin: warm and dry; no visible rashes Neurologic: gait normal; normal reflexes of LUE; normal sensation of LUE; normal strength of LUE Psychological: alert and cooperative; normal mood and affect  Imaging: Dg Wrist Complete Left  Result Date: 07/07/2019 CLINICAL DATA:  Left wrist pain and swelling secondary to a fall 2 days ago. Limited range of motion. EXAM: LEFT WRIST - COMPLETE 3+ VIEW COMPARISON:  None. FINDINGS: There is no evidence of fracture or dislocation. There is no evidence of arthropathy or other focal bone abnormality. Soft tissues are unremarkable. IMPRESSION: Negative. Electronically Signed   By: Lorriane Shire M.D.   On: 07/07/2019 14:02      Allergies  Allergen Reactions  . Hydrocodone Itching    Itchy throat  . Latex Hives and Rash    Past Medical History:  Diagnosis Date  . Hypertension    Social History   Socioeconomic History  . Marital status: Single    Spouse name: Not on file  .  Number of children: Not on file  . Years of education: Not on file  . Highest education level: Not on file  Occupational History  . Not on file  Social Needs  . Financial resource strain: Not on file  . Food insecurity    Worry: Not on file    Inability:  Not on file  . Transportation needs    Medical: Not on file    Non-medical: Not on file  Tobacco Use  . Smoking status: Never Smoker  . Smokeless tobacco: Never Used  Substance and Sexual Activity  . Alcohol use: Yes    Comment: wine socially  . Drug use: No  . Sexual activity: Not on file  Lifestyle  . Physical activity    Days per week: Not on file    Minutes per session: Not on file  . Stress: Not on file  Relationships  . Social Herbalist on phone: Not on file    Gets together: Not on file    Attends religious service: Not on file    Active member of club or organization: Not on file    Attends meetings of clubs or organizations: Not on file    Relationship status: Not on file  Other Topics Concern  . Not on file  Social History Narrative  . Not on file   Family History  Problem Relation Age of Onset  . Hypertension Father   . Heart disease Brother   . Kidney disease Brother   . Heart attack Brother   . Diabetes Paternal Grandfather   . Heart disease Paternal Grandfather   . Healthy Mother   . Stroke Maternal Grandmother    Past Surgical History:  Procedure Laterality Date  . ABDOMINAL SURGERY    . CESAREAN SECTION    . SHOULDER SURGERY    . TUBAL LIGATION        Vanessa Kick, MD 07/09/19 [phone]

## 2019-07-07 NOTE — Discharge Instructions (Addendum)
Your blood pressure was noted to be elevated during your visit today. You may return here within the next few days to recheck if unable to see your primary care doctor.  BP (!) 126/103 (BP Location: Right Arm)    Pulse 70    Temp 98.2 F (36.8 C) (Oral)    Resp 20

## 2019-08-24 ENCOUNTER — Emergency Department (HOSPITAL_COMMUNITY): Payer: 59

## 2019-08-24 ENCOUNTER — Emergency Department (HOSPITAL_COMMUNITY)
Admission: EM | Admit: 2019-08-24 | Discharge: 2019-08-24 | Disposition: A | Discharge status: Home / Self Care | Payer: 59 | Attending: Emergency Medicine | Admitting: Emergency Medicine

## 2019-08-24 ENCOUNTER — Encounter (HOSPITAL_COMMUNITY): Payer: Self-pay | Admitting: Emergency Medicine

## 2019-08-24 ENCOUNTER — Other Ambulatory Visit: Payer: Self-pay

## 2019-08-24 DIAGNOSIS — R079 Chest pain, unspecified: Secondary | ICD-10-CM | POA: Diagnosis not present

## 2019-08-24 DIAGNOSIS — R0602 Shortness of breath: Secondary | ICD-10-CM | POA: Insufficient documentation

## 2019-08-24 DIAGNOSIS — Z79899 Other long term (current) drug therapy: Secondary | ICD-10-CM | POA: Diagnosis not present

## 2019-08-24 DIAGNOSIS — Z91040 Latex allergy status: Secondary | ICD-10-CM | POA: Insufficient documentation

## 2019-08-24 DIAGNOSIS — U071 COVID-19: Secondary | ICD-10-CM | POA: Diagnosis not present

## 2019-08-24 DIAGNOSIS — I10 Essential (primary) hypertension: Secondary | ICD-10-CM | POA: Diagnosis not present

## 2019-08-24 LAB — BASIC METABOLIC PANEL
Anion gap: 12 (ref 5–15)
BUN: 12 mg/dL (ref 6–20)
CO2: 22 mmol/L (ref 22–32)
Calcium: 8.6 mg/dL — ABNORMAL LOW (ref 8.9–10.3)
Chloride: 100 mmol/L (ref 98–111)
Creatinine, Ser: 0.98 mg/dL (ref 0.44–1.00)
GFR calc Af Amer: >60 mL/min (ref >60)
GFR calc non Af Amer: >60 mL/min (ref >60)
Glucose, Bld: 96 mg/dL (ref 70–99)
Potassium: 3.7 mmol/L (ref 3.5–5.1)
Sodium: 134 mmol/L — ABNORMAL LOW (ref 135–145)

## 2019-08-24 LAB — CBC
HCT: 38.3 % (ref 36.0–46.0)
Hemoglobin: 12.9 g/dL (ref 12.0–15.0)
MCH: 28 pg (ref 26.0–34.0)
MCHC: 33.7 g/dL (ref 30.0–36.0)
MCV: 83.1 fL (ref 80.0–100.0)
Platelets: 195 10*3/uL (ref 150–400)
RBC: 4.61 MIL/uL (ref 3.87–5.11)
RDW: 14.1 % (ref 11.5–15.5)
WBC: 8.7 10*3/uL (ref 4.0–10.5)
nRBC: 0 % (ref 0.0–0.2)

## 2019-08-24 LAB — I-STAT BETA HCG BLOOD, ED (MC, WL, AP ONLY): I-stat hCG, quantitative: <5 m[IU]/mL (ref <5)

## 2019-08-24 LAB — TROPONIN I (HIGH SENSITIVITY): Troponin I (High Sensitivity): 4 ng/L (ref <18)

## 2019-08-24 IMAGING — DX DG CHEST 1V PORT
1 series · 1 of 1 positions shown · non-contrast
Comparison: Portable exam [LS] hours compared to [DATE]

CLINICAL DATA: [LS] positive, shortness of breath on exertion

EXAM:
PORTABLE CHEST 1 VIEW

[chest ap]
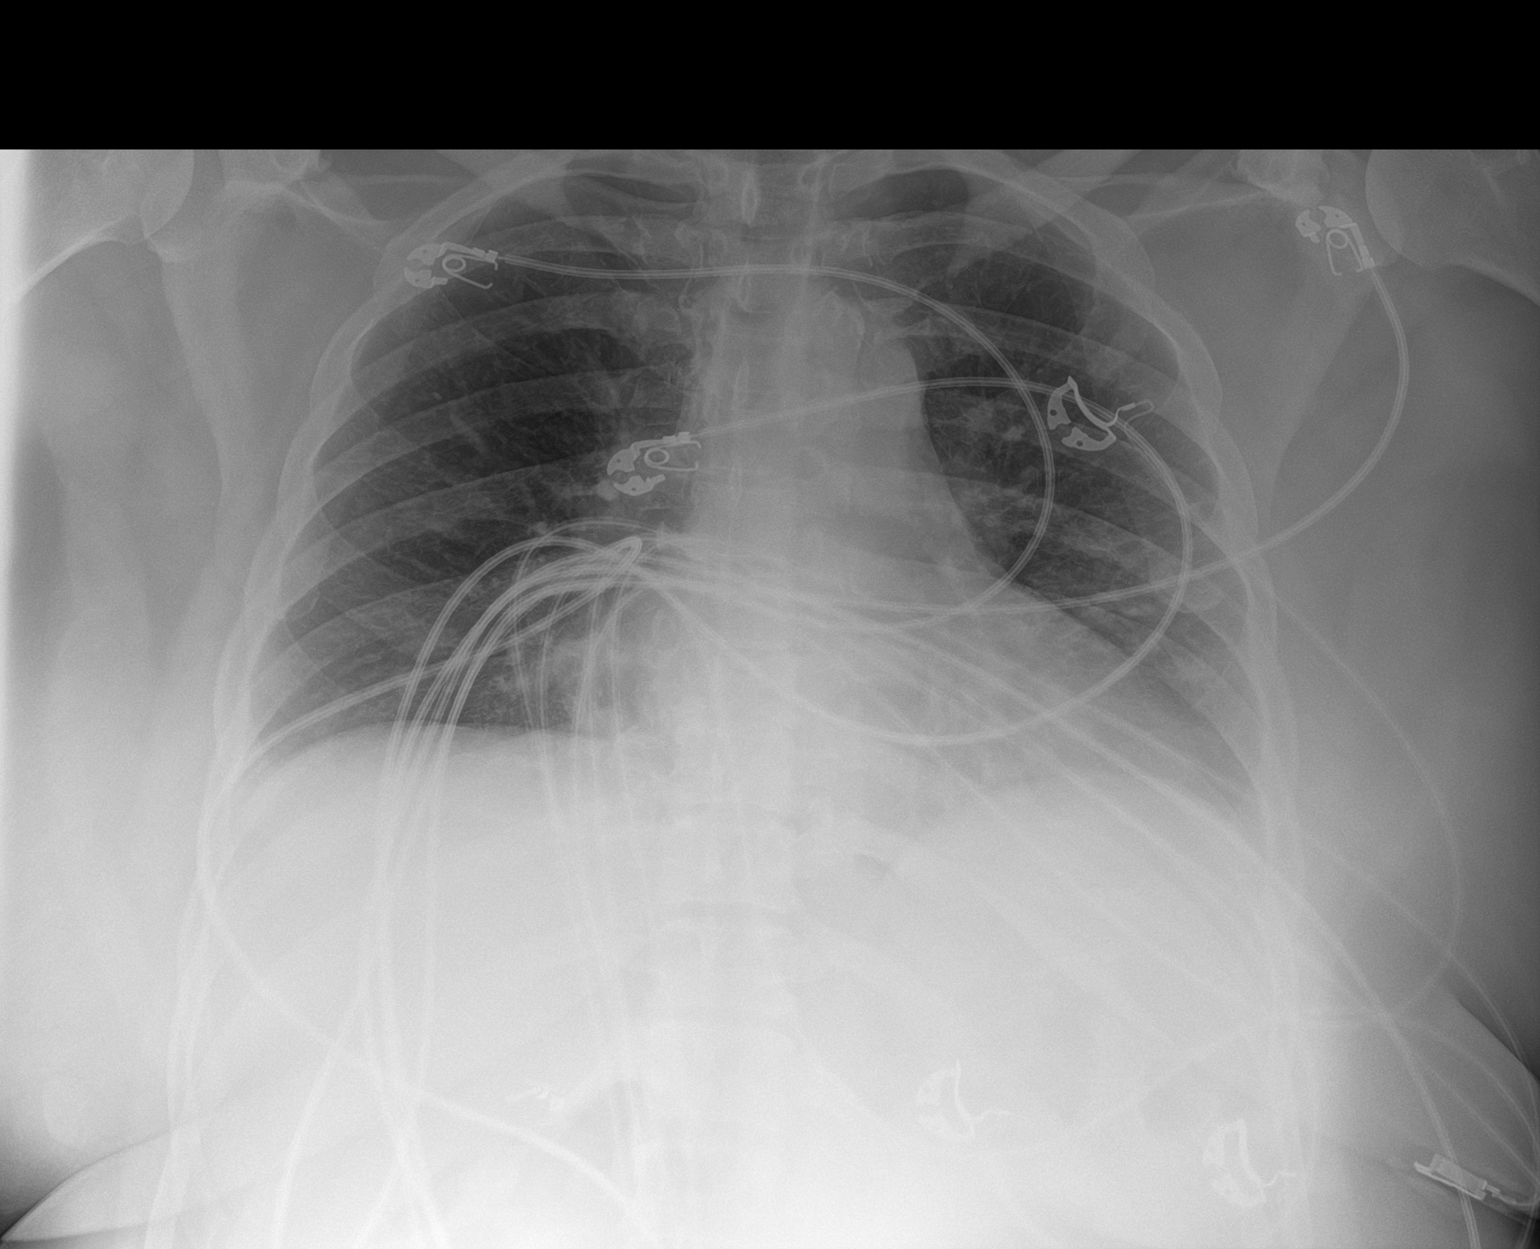

[1 of 1 positions shown; findings below may reference images not displayed]

FINDINGS: Minimal enlargement of cardiac silhouette.

Mediastinal contours and pulmonary vascularity normal.

Decreased lung volumes with minimal bibasilar atelectasis.

No definite infiltrate, pleural effusion or pneumothorax.

Osseous structures unremarkable.
IMPRESSION: Decreased lung volumes with minimal bibasilar atelectasis.

## 2019-08-24 MED ORDER — BENZONATATE 100 MG PO CAPS: 100.0000 mg | ORAL_CAPSULE | Freq: Three times a day (TID) | ORAL | 0 refills | Status: DC

## 2019-08-24 MED ORDER — SODIUM CHLORIDE 0.9 % IV BOLUS
500.0000 mL | Freq: Once | INTRAVENOUS | Status: AC
  Administered 2019-08-24: 500 mL via INTRAVENOUS

## 2019-08-24 MED ORDER — ONDANSETRON HCL 4 MG/2ML IJ SOLN
4.0000 mg | Freq: Once | INTRAMUSCULAR | Status: AC
  Administered 2019-08-24: 19:00:00 4 mg via INTRAVENOUS
  Filled 2019-08-24: qty 2

## 2019-08-24 MED ORDER — ACETAMINOPHEN 325 MG PO TABS
650.0000 mg | ORAL_TABLET | Freq: Once | ORAL | Status: AC | PRN
  Administered 2019-08-24: 17:00:00 650 mg via ORAL
  Filled 2019-08-24: qty 2

## 2019-08-24 MED ORDER — ONDANSETRON HCL 4 MG PO TABS: 4.0000 mg | ORAL_TABLET | Freq: Four times a day (QID) | ORAL | 0 refills | Status: DC

## 2019-08-24 NOTE — ED Triage Notes (Signed)
Pt endorses SOB and chest tightness for a week. Tested covid + on 1/21. Fever of 103 in triage.

## 2019-08-24 NOTE — ED Notes (Signed)
Initial SP02- 94% once patient sat up, she exclaimed that she felt dizzy along with weakness in her legs. Patient then laid back down in the bed.

## 2019-08-24 NOTE — ED Notes (Signed)
While ambulating patient's initial SPO2 was 95 with a pulse of 84. After ambulation patient's SPO2 was 99 with a pulse of 104. Patient denied being dizzy but stated she was weak.

## 2019-08-24 NOTE — ED Notes (Signed)
Patient verbalizes understanding of discharge instructions. Opportunity for questioning and answers were provided. Armband removed by staff, pt discharged from ED. Pt. ambulatory and discharged home.  

## 2019-08-24 NOTE — Discharge Instructions (Addendum)
Your labwork and chest xray were reassuring today. I have prescribed medications for both nausea as well as coughing. Please pickup and take as prescribed.   Try to drink plenty of fluids to stay hydrated. Use your albuterol inhaler as needed.   Continue quarantining as indicated due to your COVID 19 positive status.

## 2019-08-24 NOTE — ED Provider Notes (Signed)
[facility] EMERGENCY DEPARTMENT Provider Note   CSN: [id number] Arrival date & time: 08/24/19  1704     History Chief Complaint  Patient presents with  . Shortness of Breath  . Chest Pain    Mallory Henderson is a 54 y.o. female with PMHx HTN who presents to the ED today complaining of gradual onset, constant, worsening, shortness of breath and chest tightness x 3 days.  She reports she began feeling ill with a cough, fevers, chills 11 days ago.  She went and got tested for Covid on 1/21 and tested positive.  Reports that for the past 3 days after testing positive she has had a decline.  She states that she feels more short of breath now and has been having some chest tightness.  Patient does have an albuterol inhaler at home and states she has been using it 3-4 times per day with mild relief.  Arrival to the ED patient's temp 103, she was not aware that she had a fever.  She has not been checking her fevers at home and has not been taking Tylenol.   The history is provided by the patient.       Past Medical History:  Diagnosis Date  . Hypertension     Patient Active Problem List   Diagnosis Date Noted  . Morbid obesity ([place]) 12/21/2015  . Hyperlipidemia 12/21/2015  . Evaluate for Sleep apnea 12/21/2015  . Obesity, unspecified 11/07/2013  . Pure hypercholesterolemia 11/07/2013  . Essential hypertension 11/07/2013    Past Surgical History:  Procedure Laterality Date  . ABDOMINAL SURGERY    . CESAREAN SECTION    . SHOULDER SURGERY    . TUBAL LIGATION       OB History   No obstetric history on file.     Family History  Problem Relation Age of Onset  . Hypertension Father   . Heart disease Brother   . Kidney disease Brother   . Heart attack Brother   . Diabetes Paternal Grandfather   . Heart disease Paternal Grandfather   . Healthy Mother   . Stroke Maternal Grandmother     Social History   Tobacco Use  . Smoking status: Never Smoker  .  Smokeless tobacco: Never Used  Substance Use Topics  . Alcohol use: Yes    Comment: wine socially  . Drug use: No    Home Medications Prior to Admission medications   Medication Sig Start Date End Date Taking? Authorizing Provider  albuterol (PROVENTIL HFA;VENTOLIN HFA) 108 (90 Base) MCG/ACT inhaler Inhale 1-2 puffs into the lungs every 6 (six) hours as needed for wheezing or shortness of breath. 11/13/17   Jola Schmidt, MD  benzonatate (TESSALON) 100 MG capsule Take 1 capsule (100 mg total) by mouth every 8 (eight) hours. 08/24/19   Alroy Bailiff, Casey Fye, PA-C  cloNIDine (CATAPRES) 0.1 MG tablet Take 0.1 mg by mouth 2 (two) times daily.    [provider]  furosemide (LASIX) 40 MG tablet Take 1 tablet by mouth daily. 05/14/17   [provider]  loratadine (CLARITIN) 10 MG tablet Take 1 tablet (10 mg total) by mouth daily. 11/13/17   Jola Schmidt, MD  lovastatin (MEVACOR) 40 MG tablet Take 1 tablet by mouth daily. 04/01/17   [provider]  meloxicam (MOBIC) 15 MG tablet Take 1 tablet (15 mg total) by mouth daily. 01/07/19   Raylene Everts, MD  metoprolol tartrate (LOPRESSOR) 100 MG tablet Take 1 tablet by mouth 2 (  two) times daily. 05/12/17   [provider]  ondansetron (ZOFRAN) 4 MG tablet Take 1 tablet (4 mg total) by mouth every 6 (six) hours. 08/24/19   Eustaquio Maize, PA-C  oxyCODONE-acetaminophen (PERCOCET) 10-325 MG tablet Take 1 tablet by mouth 3 (three) times daily as needed. 05/21/17   [provider]  tiZANidine (ZANAFLEX) 4 MG tablet Take 1-2 tablets (4-8 mg total) by mouth every 6 (six) hours as needed for muscle spasms. 01/07/19   Raylene Everts, MD    Allergies    Hydrocodone, Tramadol, and Latex  Review of Systems   Review of Systems  Constitutional: Positive for chills, fatigue and fever.  HENT: Negative for congestion.   Eyes: Negative for visual disturbance.  Respiratory: Positive for cough and shortness of breath.     Cardiovascular: Positive for chest pain.  Gastrointestinal: Positive for abdominal pain, diarrhea, nausea and vomiting. Negative for constipation.  Musculoskeletal: Negative for back pain.  Skin: Negative for rash.  Neurological: Negative for dizziness, light-headedness and headaches.    Physical Exam Updated Vital Signs BP 119/73   Pulse 89   Temp 99.6 F (37.6 C) (Oral)   Resp (!) 23   Ht 5\' 2"  (1.575 m)   Wt 114.8 kg   SpO2 96%   BMI 46.27 kg/m   Physical Exam Vitals and nursing note reviewed.  Constitutional:      Appearance: She is not ill-appearing or diaphoretic.  HENT:     Head: Normocephalic and atraumatic.  Eyes:     Conjunctiva/sclera: Conjunctivae normal.  Cardiovascular:     Rate and Rhythm: Normal rate and regular rhythm.     Pulses: Normal pulses.  Pulmonary:     Effort: Pulmonary effort is normal.     Breath sounds: Normal breath sounds. No decreased breath sounds, wheezing, rhonchi or rales.  Chest:     Chest wall: Tenderness present.  Abdominal:     Palpations: Abdomen is soft.     Tenderness: There is no abdominal tenderness. There is no guarding or rebound.  Musculoskeletal:     Cervical back: Neck supple.     Right lower leg: No edema.     Left lower leg: No edema.  Skin:    General: Skin is warm and dry.  Neurological:     Mental Status: She is alert.     ED Results / Procedures / Treatments   Labs (all labs ordered are listed, but only abnormal results are displayed) Labs Reviewed  BASIC METABOLIC PANEL - Abnormal; Notable for the following components:      Result Value   Sodium 134 (*)    Calcium 8.6 (*)    All other components within normal limits  CBC  I-STAT BETA HCG BLOOD, ED ([facility], [facility], [facility] ONLY)  TROPONIN I (HIGH SENSITIVITY)    EKG EKG Interpretation  Date/Time:  Sunday August 24 2019 17:30:34 EST Ventricular Rate:  98 PR Interval:  146 QRS Duration: 90 QT Interval:  348 QTC Calculation: 445 R Axis:   -9 Text  Interpretation: Sinus rhythm Low voltage, precordial leads No STMEI Confirmed by Octaviano Glow [phone]) on 08/24/2019 8:04:41 PM   Radiology DG Chest Port 1 View  Result Date: 08/24/2019 CLINICAL DATA:  COVID-19 positive, shortness of breath on exertion EXAM: PORTABLE CHEST 1 VIEW COMPARISON:  Portable exam 1728 hours compared to 11/13/2017 FINDINGS: Minimal enlargement of cardiac silhouette. Mediastinal contours and pulmonary vascularity normal. Decreased lung volumes with minimal bibasilar atelectasis. No definite infiltrate, pleural effusion  or pneumothorax. Osseous structures unremarkable. IMPRESSION: Decreased lung volumes with minimal bibasilar atelectasis. Electronically Signed   By: Lavonia Dana M.D.   On: 08/24/2019 17:42    Procedures Procedures (including critical care time)  Medications Ordered in ED Medications  acetaminophen (TYLENOL) tablet 650 mg (650 mg Oral Given 08/24/19 1716)  ondansetron (ZOFRAN) injection 4 mg (4 mg Intravenous Given 08/24/19 1916)  sodium chloride 0.9 % bolus 500 mL (0 mLs Intravenous Stopped 08/24/19 2201)    ED Course  I have reviewed the triage vital signs and the nursing notes.  Pertinent labs & imaging results that were available during my care of the patient were reviewed by me and considered in my medical decision making (see chart for details).  54 year old female presents the ED today complaining of shortness of breath, cough, chest pain, dizziness, weakness.  She tested positive for Covid 19 3 days ago however she has been having symptoms for 11 days.  She states that they have been gradually worsening prompting her to come to the ED.  Patient states that she has been vomiting and unable to keep anything down and feels like she is hydrated.  On arrival to the ED patient is febrile at 103 and tachycardic in the low 100s.  She is able to speak in full sentences without difficulty and satting 95% on room air.  He has no focal abdominal tenderness  to suggest acute abdomen today.  Suspect that this is likely all from her vomiting with her COVID-19 positive status.  Will obtain screening labs including troponin due to complaint of chest pain, chest x-ray, EKG.  She has received Tylenol for her fever.  She does appear dry on exam and has been vomiting, Zofran will be given.  Will give a small amount of fluids 500 cc and reevaluate.  CBC without leukocytosis.  Hemoglobin stable.  BMP without electrolyte abnormalities today.  Creatinine within normal limits.  Troponin of 4.  Given that her chest pain does not appear cardiac in nature and is likely more chest tightness due to coughing do not feel she needs an additional troponin at this time.  She has a low heart score.  X-ray is clear.  She has been ambulated in the ED and her oxygen remained above normal.  Patient stable for discharge home at this time.  We had lengthy discussion regarding the fact that she is on day 11 and hopefully will start feeling better soon.  She is advised to continue quarantining as initially indicated.  Prescribe Zofran and Tessalon Perles for comfort.  Patient encouraged to increase her fluid intake at home to stay hydrated.  Advised to follow-up with her PCP.  She does have an albuterol inhaler at home and has to take as needed.  She is in agreement with plan and stable for discharge home.   This note was prepared using Dragon voice recognition software and may include unintentional dictation errors due to the inherent limitations of voice recognition software.  JACEE PASSALAQUA was evaluated in Emergency Department on 08/24/2019 for the symptoms described in the history of present illness. She was evaluated in the context of the global COVID-19 pandemic, which necessitated consideration that the patient might be at risk for infection with the SARS-CoV-2 virus that causes COVID-19. Institutional protocols and algorithms that pertain to the evaluation of patients at risk for  COVID-19 are in a state of rapid change based on information released by regulatory bodies including the CDC and federal and state  organizations. These policies and algorithms were followed during the patient's care in the ED.   Clinical Course as of Aug 23 2202  Nancy Fetter Aug 24, 2019  2042 Patient seen by myself as well as PA provider.  Rush Landmark is a 54 year old female with a history of hypertension presenting to the emergency department with nausea and fatigue, now on day 11 of symptoms of her recent Covid diagnosis.  She is not having the respiratory distress.  She has a benign physical exam.  She was reporting some chest tightness or pressure earlier.  Her chest x-ray appears clear.  Her EKG is unremarkable.  We are awaiting a troponin as well as a BMP.  If this is negative anticipate discharge home.  I did review the timeline and expected symptoms with the patient, and explained I do expect her to slowly improve from this point forward.   [MT]    Clinical Course User Index [MT] Trifan, Carola Rhine, MD   MDM Rules/Calculators/A&P                       Final Clinical Impression(s) / ED Diagnoses Final diagnoses:  COVID-19  Shortness of breath    Rx / DC Orders ED Discharge Orders         Ordered    benzonatate (TESSALON) 100 MG capsule  Every 8 hours     08/24/19 2159    ondansetron (ZOFRAN) 4 MG tablet  Every 6 hours     08/24/19 2159            Discharge Instructions     Your labwork and chest xray were reassuring today. I have prescribed medications for both nausea as well as coughing. Please pickup and take as prescribed.   Try to drink plenty of fluids to stay hydrated. Use your albuterol inhaler as needed.   Continue quarantining as indicated due to your COVID 19 positive status.        Eustaquio Maize, PA-C 08/24/19 2204    Wyvonnia Dusky, MD 08/25/19 [phone]

## 2020-04-15 ENCOUNTER — Other Ambulatory Visit: Payer: Self-pay

## 2020-04-15 ENCOUNTER — Encounter (HOSPITAL_COMMUNITY): Payer: Self-pay | Admitting: Emergency Medicine

## 2020-04-15 ENCOUNTER — Ambulatory Visit (HOSPITAL_COMMUNITY)
Admission: EM | Admit: 2020-04-15 | Discharge: 2020-04-15 | Disposition: A | Discharge status: Home / Self Care | Payer: 59 | Attending: Family Medicine | Admitting: Family Medicine

## 2020-04-15 ENCOUNTER — Ambulatory Visit (INDEPENDENT_AMBULATORY_CARE_PROVIDER_SITE_OTHER): Payer: 59

## 2020-04-15 DIAGNOSIS — S8991XA Unspecified injury of right lower leg, initial encounter: Secondary | ICD-10-CM

## 2020-04-15 DIAGNOSIS — W19XXXA Unspecified fall, initial encounter: Secondary | ICD-10-CM

## 2020-04-15 DIAGNOSIS — Y92009 Unspecified place in unspecified non-institutional (private) residence as the place of occurrence of the external cause: Secondary | ICD-10-CM

## 2020-04-15 DIAGNOSIS — M79661 Pain in right lower leg: Secondary | ICD-10-CM | POA: Diagnosis not present

## 2020-04-15 IMAGING — DX DG TIBIA/FIBULA 2V*R*
2 series · 2 of 2 positions shown · non-contrast
Comparison: None.

CLINICAL DATA: Pain following fall

EXAM:
RIGHT TIBIA AND FIBULA - 2 VIEW

[tibia ap]
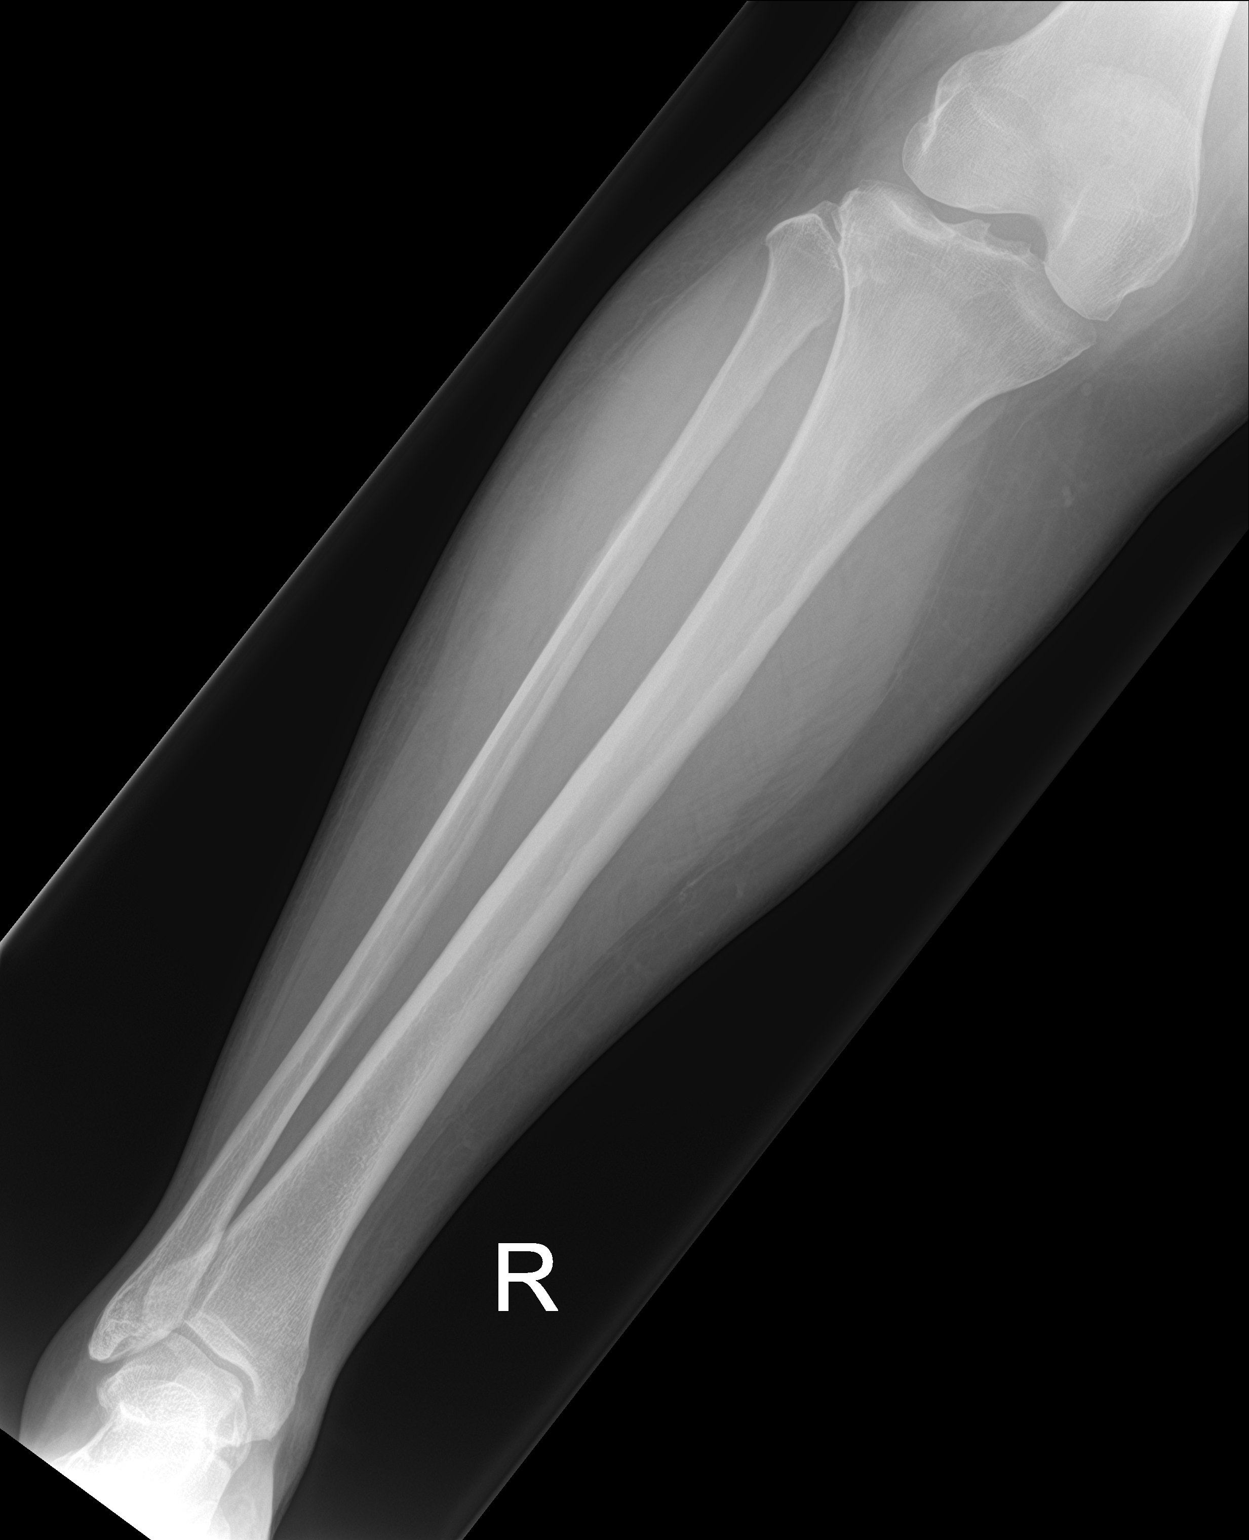

[tibia lat]
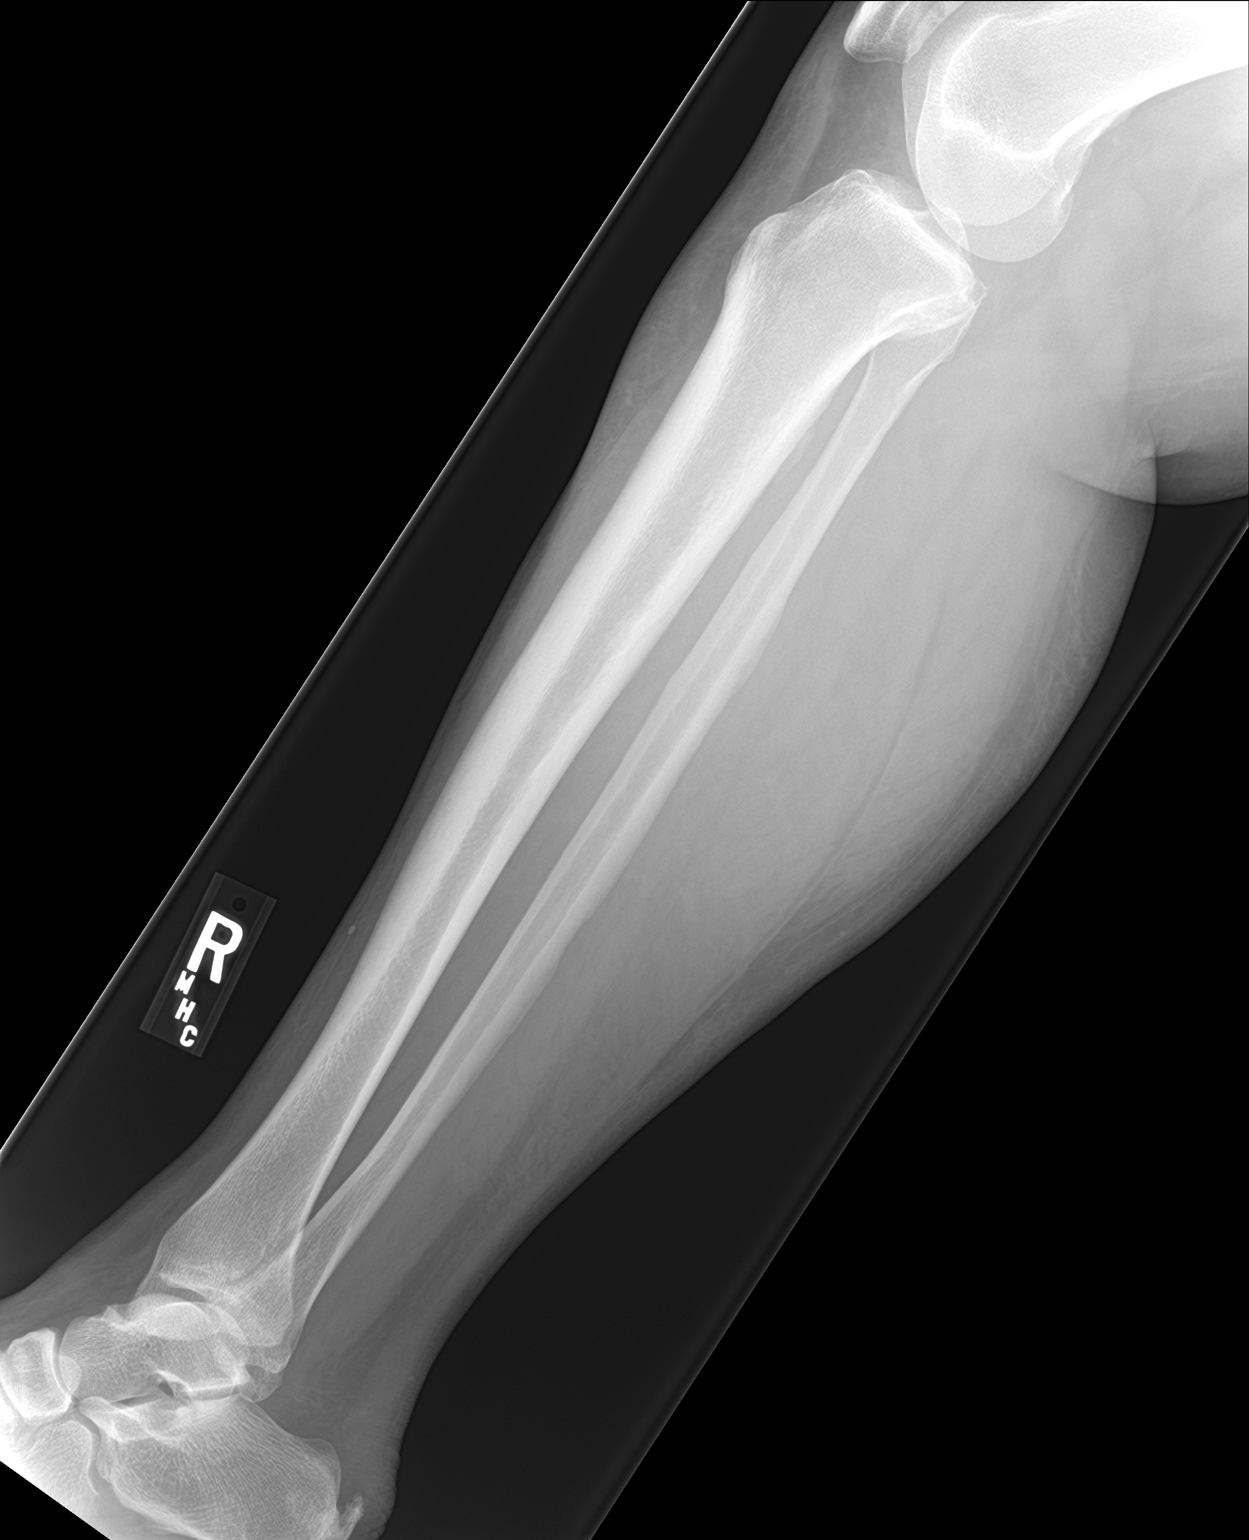

[2 of 2 positions shown; findings below may reference images not displayed]

FINDINGS: Frontal and lateral views were obtained. No fracture or dislocation.
No abnormal periosteal reaction. Joint spaces appear unremarkable.
There is a spur arising from the posterior calcaneus.
IMPRESSION: No fracture or dislocation. No appreciable joint space narrowing.
Posterior calcaneal spur noted.

## 2020-04-15 MED ORDER — MELOXICAM 7.5 MG PO TABS: 7.5000 mg | ORAL_TABLET | Freq: Every day | ORAL | 1 refills | Status: DC

## 2020-04-15 MED ORDER — CYCLOBENZAPRINE HCL 5 MG PO TABS: 5.0000 mg | ORAL_TABLET | Freq: Three times a day (TID) | ORAL | 0 refills | Status: DC | PRN

## 2020-04-15 NOTE — ED Triage Notes (Signed)
Pt presents with back pain and right side pain from hip to ankle xs 4 days.

## 2020-04-15 NOTE — Discharge Instructions (Addendum)
Your x ray was normal Most likely bad bruise Rest, ice the area Meloxicam for pain as needed Flexeril  for muscle relaxant.

## 2020-04-16 NOTE — ED Provider Notes (Signed)
[facility]    CSN: [id number] Arrival date & time: 04/15/20  6599      History   Chief Complaint Chief Complaint  Patient presents with  . Fall    HPI Mallory Henderson is a 54 y.o. female.   Patient is a 54 year old female who presents today for back pain and right side pain from fall 4 days ago.  Symptoms have been constant.  Rating from back down to hip and ankle on the right side.  Denies any associated numbness, tingling or weakness.  No bruising or deformities.     Past Medical History:  Diagnosis Date  . Hypertension     Patient Active Problem List   Diagnosis Date Noted  . Morbid obesity ([place]) 12/21/2015  . Hyperlipidemia 12/21/2015  . Evaluate for Sleep apnea 12/21/2015  . Obesity, unspecified 11/07/2013  . Pure hypercholesterolemia 11/07/2013  . Essential hypertension 11/07/2013    Past Surgical History:  Procedure Laterality Date  . ABDOMINAL SURGERY    . CESAREAN SECTION    . SHOULDER SURGERY    . TUBAL LIGATION      OB History   No obstetric history on file.      Home Medications    Prior to Admission medications   Medication Sig Start Date End Date Taking? Authorizing Provider  albuterol (PROVENTIL HFA;VENTOLIN HFA) 108 (90 Base) MCG/ACT inhaler Inhale 1-2 puffs into the lungs every 6 (six) hours as needed for wheezing or shortness of breath. 11/13/17   Jola Schmidt, MD  cloNIDine (CATAPRES) 0.1 MG tablet Take 0.1 mg by mouth 2 (two) times daily.    [provider]  cyclobenzaprine (FLEXERIL) 5 MG tablet Take 1 tablet (5 mg total) by mouth 3 (three) times daily as needed for muscle spasms. 04/15/20   Loura Halt A, NP  furosemide (LASIX) 40 MG tablet Take 1 tablet by mouth daily. 05/14/17   [provider]  loratadine (CLARITIN) 10 MG tablet Take 1 tablet (10 mg total) by mouth daily. 11/13/17   Jola Schmidt, MD  lovastatin (MEVACOR) 40 MG tablet Take 1 tablet by mouth daily. 04/01/17   [provider]    meloxicam (MOBIC) 7.5 MG tablet Take 1 tablet (7.5 mg total) by mouth daily. 04/15/20   Loura Halt A, NP  metoprolol tartrate (LOPRESSOR) 100 MG tablet Take 1 tablet by mouth 2 (two) times daily. 05/12/17   [provider]    Family History Family History  Problem Relation Age of Onset  . Hypertension Father   . Heart disease Brother   . Kidney disease Brother   . Heart attack Brother   . Diabetes Paternal Grandfather   . Heart disease Paternal Grandfather   . Healthy Mother   . Stroke Maternal Grandmother     Social History Social History   Tobacco Use  . Smoking status: Never Smoker  . Smokeless tobacco: Never Used  Vaping Use  . Vaping Use: Never used  Substance Use Topics  . Alcohol use: Yes    Comment: wine socially  . Drug use: No     Allergies   Hydrocodone, Tramadol, and Latex   Review of Systems Review of Systems   Physical Exam Triage Vital Signs ED Triage Vitals  Enc Vitals Group     BP 04/15/20 0826 (!) 148/95     Pulse Rate 04/15/20 0826 62     Resp 04/15/20 0826 18     Temp 04/15/20 0826 98.4 F (36.9 C)  Temp Source 04/15/20 0826 Oral     SpO2 04/15/20 0826 98 %     Weight --      Height --      Head Circumference --      Peak Flow --      Pain Score 04/15/20 0825 8     Pain Loc --      Pain Edu? --      Excl. in [place]? --    No data found.  Updated Vital Signs BP (!) 148/95 (BP Location: Left Arm)   Pulse 62   Temp 98.4 F (36.9 C) (Oral)   Resp 18   SpO2 98%   Visual Acuity Right Eye Distance:   Left Eye Distance:   Bilateral Distance:    Right Eye Near:   Left Eye Near:    Bilateral Near:     Physical Exam Vitals and nursing note reviewed.  Constitutional:      General: She is not in acute distress.    Appearance: Normal appearance. She is not ill-appearing, toxic-appearing or diaphoretic.  HENT:     Head: Normocephalic.     Nose: Nose normal.  Eyes:     Conjunctiva/sclera: Conjunctivae normal.   Pulmonary:     Effort: Pulmonary effort is normal.  Musculoskeletal:        General: Normal range of motion.     Cervical back: Normal range of motion.       Back:       Legs:     Comments: TTP   Skin:    General: Skin is warm and dry.     Findings: No rash.  Neurological:     Mental Status: She is alert.  Psychiatric:        Mood and Affect: Mood normal.      UC Treatments / Results  Labs (all labs ordered are listed, but only abnormal results are displayed) Labs Reviewed - No data to display  EKG   Radiology DG Tibia/Fibula Right  Result Date: 04/15/2020 CLINICAL DATA:  Pain following fall EXAM: RIGHT TIBIA AND FIBULA - 2 VIEW COMPARISON:  None. FINDINGS: Frontal and lateral views were obtained. No fracture or dislocation. No abnormal periosteal reaction. Joint spaces appear unremarkable. There is a spur arising from the posterior calcaneus. IMPRESSION: No fracture or dislocation. No appreciable joint space narrowing. Posterior calcaneal spur noted. Electronically Signed   By: Lowella Grip III M.D.   On: 04/15/2020 09:03    Procedures Procedures (including critical care time)  Medications Ordered in UC Medications - No data to display  Initial Impression / Assessment and Plan / UC Course  I have reviewed the triage vital signs and the nursing notes.  Pertinent labs & imaging results that were available during my care of the patient were reviewed by me and considered in my medical decision making (see chart for details).     Fall and injury to right lower extremity.  Most pain palpated to right tib-fib area. X-ray without any acute findings.  Most likely bad bruise. Lower back pain musculoskeletal in nature. Rest, ice, elevate.  Meloxicam for pain as needed.  Flexeril for muscle relaxant. Follow up as needed for continued or worsening symptoms  Final Clinical Impressions(s) / UC Diagnoses   Final diagnoses:  Injury of right lower extremity, initial  encounter  Fall in home, initial encounter     Discharge Instructions     Your x ray was normal Most likely bad bruise Rest, ice  the area Meloxicam for pain as needed Flexeril  for muscle relaxant.      ED Prescriptions    Medication Sig Dispense Auth. Provider   meloxicam (MOBIC) 7.5 MG tablet Take 1 tablet (7.5 mg total) by mouth daily. 30 tablet Ziv Welchel A, NP   cyclobenzaprine (FLEXERIL) 5 MG tablet Take 1 tablet (5 mg total) by mouth 3 (three) times daily as needed for muscle spasms. 30 tablet Loura Halt A, NP     PDMP not reviewed this encounter.   Orvan July, NP 04/16/20 1646

## 2020-08-04 ENCOUNTER — Ambulatory Visit: Payer: 59

## 2020-08-23 NOTE — Progress Notes (Deleted)
Cardiology Office Note:    Date:  08/23/2020   ID:  ASHLYE OVIEDO, DOB September 02, 1965, MRN [id number]  PCP:  Antonietta Jewel, MD  Cardiologist:  No primary care provider on file.  Electrophysiologist:  None   Referring MD: Antonietta Jewel, MD   Chief Complaint/Reason for Referral: Chest pain  History of Present Illness:    KAILIA STARRY is a 55 y.o. female with a history of ***  Normal ETT in 2017, normal echo in 2017.   Past Medical History:  Diagnosis Date  . Hypertension     Past Surgical History:  Procedure Laterality Date  . ABDOMINAL SURGERY    . CESAREAN SECTION    . SHOULDER SURGERY    . TUBAL LIGATION      Current Medications: No outpatient medications have been marked as taking for the 08/24/20 encounter (Appointment) with Elouise Munroe, MD.     Allergies:   Hydrocodone, Tramadol, and Latex   Social History   Tobacco Use  . Smoking status: Never Smoker  . Smokeless tobacco: Never Used  Vaping Use  . Vaping Use: Never used  Substance Use Topics  . Alcohol use: Yes    Comment: wine socially  . Drug use: No     Family History: The patient's family history includes Diabetes in her paternal grandfather; Healthy in her mother; Heart attack in her brother; Heart disease in her brother and paternal grandfather; Hypertension in her father; Kidney disease in her brother; Stroke in her maternal grandmother.  ROS:   Please see the history of present illness.    All other systems reviewed and are negative.  EKGs/Labs/Other Studies Reviewed:    The following studies were reviewed today:  EKG:  ***  I have independently reviewed the images from ***.  Recent Labs: 08/24/2019: BUN 12; Creatinine, Ser 0.98; Hemoglobin 12.9; Platelets 195; Potassium 3.7; Sodium 134  Recent Lipid Panel    Component Value Date/Time   CHOL 206 (H) 06/17/2013 0921   TRIG 143 06/17/2013 0921   HDL 44 06/17/2013 0921   CHOLHDL 4.7 06/17/2013 0921   VLDL 29 06/17/2013 0921    LDLCALC 133 (H) 06/17/2013 0921    Physical Exam:    VS:  There were no vitals taken for this visit.    Wt Readings from Last 5 Encounters:  08/24/19 253 lb (114.8 kg)  11/13/17 254 lb (115.2 kg)  05/28/17 263 lb 6.4 oz (119.5 kg)  02/14/16 236 lb (107 kg)  12/21/15 232 lb (105.2 kg)    Constitutional: No acute distress Eyes: sclera non-icteric, normal conjunctiva and lids ENMT: normal dentition, moist mucous membranes Cardiovascular: regular rhythm, normal rate, no murmurs. S1 and S2 normal. Radial pulses normal bilaterally. No jugular venous distention.  Respiratory: clear to auscultation bilaterally GI : normal bowel sounds, soft and nontender. No distention.   MSK: extremities warm, well perfused. No edema.  NEURO: grossly nonfocal exam, moves all extremities. PSYCH: alert and oriented x 3, normal mood and affect.   ASSESSMENT:    No diagnosis found. PLAN:    No diagnosis found.  Total time of encounter: *** minutes total time of encounter, including *** minutes spent in face-to-face patient care on the date of this encounter. This time includes coordination of care and counseling regarding above mentioned problem list. Remainder of non-face-to-face time involved reviewing chart documents/testing relevant to the patient encounter and documentation in the medical record. I have independently reviewed documentation from referring provider.   Cherlynn Kaiser, MD    [facility]    Medication Adjustments/Labs and Tests Ordered: Current medicines are reviewed at length with the patient today.  Concerns regarding medicines are outlined above.   No orders of the defined types were placed in this encounter.   Shared Decision Making/Informed Consent:   {Are you ordering a CV Procedure (e.g. stress test, cath, DCCV, TEE, etc)?   Press F2        :[id number]}   No orders of the defined types were placed in this encounter.   There are no Patient Instructions on  file for this visit.

## 2020-08-24 ENCOUNTER — Ambulatory Visit: Payer: 59 | Admitting: Internal Medicine

## 2020-09-01 ENCOUNTER — Encounter: Payer: Self-pay | Admitting: Cardiology

## 2020-09-01 ENCOUNTER — Ambulatory Visit (INDEPENDENT_AMBULATORY_CARE_PROVIDER_SITE_OTHER): Payer: 59

## 2020-09-01 ENCOUNTER — Other Ambulatory Visit: Payer: Self-pay

## 2020-09-01 ENCOUNTER — Ambulatory Visit (INDEPENDENT_AMBULATORY_CARE_PROVIDER_SITE_OTHER): Payer: 59 | Admitting: Cardiology

## 2020-09-01 VITALS — BP 150/90 | HR 67 | Ht 62.0 in | Wt 250.0 lb

## 2020-09-01 DIAGNOSIS — R002 Palpitations: Secondary | ICD-10-CM

## 2020-09-01 DIAGNOSIS — R072 Precordial pain: Secondary | ICD-10-CM | POA: Diagnosis not present

## 2020-09-01 DIAGNOSIS — G4733 Obstructive sleep apnea (adult) (pediatric): Secondary | ICD-10-CM

## 2020-09-01 DIAGNOSIS — I10 Essential (primary) hypertension: Secondary | ICD-10-CM

## 2020-09-01 DIAGNOSIS — E7800 Pure hypercholesterolemia, unspecified: Secondary | ICD-10-CM

## 2020-09-01 DIAGNOSIS — E7849 Other hyperlipidemia: Secondary | ICD-10-CM

## 2020-09-01 MED ORDER — VALSARTAN-HYDROCHLOROTHIAZIDE 80-12.5 MG PO TABS: ORAL_TABLET | ORAL | 2 refills | Status: AC

## 2020-09-01 NOTE — Patient Instructions (Addendum)
Medication Instructions:    start  taking Valsartan/ hctz  1/2 tablet of 80/12.5 mg daily  ( preferably in the morning)    *If you need a refill on your cardiac medications before your next appointment, please call your pharmacy*   Lab Work:  BMP -- before starting Valsartan /hctz   1/2 tablet of 80/12.5 mg  daily   If you have labs (blood work) drawn today and your tests are completely normal, you will receive your results only by: Marland Kitchen MyChart Message (if you have MyChart) OR . A paper copy in the mail If you have any lab test that is abnormal or we need to change your treatment, we will call you to review the results.   Testing/Procedures: Once we receive insurance authorization  -you will be schedule at [place] physician has requested that you have cardiac CTA. Cardiac computed tomography (CT) is a painless test that uses an x-ray machine to take clear, detailed pictures of your heart.  Please follow instruction sheet as given.   and This will be mailed to you in 5 to 10 days Your physician has recommended that you wear a  14 day Zio holter monitor. Holter monitors are medical devices that record the heart's electrical activity. Doctors most often use these monitors to diagnose arrhythmias. Arrhythmias are problems with the speed or rhythm of the heartbeat. The monitor is a small, portable device. You can wear one while you do your normal daily activities. This is usually used to diagnose what is causing palpitations/syncope (passing out).    Follow-Up: At [facility], you and your health needs are our priority.  As part of our continuing mission to provide you with exceptional heart care, we have created designated Provider Care Teams.  These Care Teams include your primary Cardiologist (physician) and Advanced Practice Providers (APPs -  Physician Assistants and Nurse Practitioners) who all work together to provide you with the care you need, when you need  it.     Your next appointment:   2 month(s)  The format for your next appointment:   In Person  Provider:   Glenetta Hew, MD   Other Instructions   Your cardiac CT will be scheduled at  the below location:   [facility] [address] [place], [place] [address] [phone]   Please arrive at the [facility] main entrance of [facility] 30 minutes prior to test start time. Proceed to the [facility] Radiology Department (first floor) to check-in and test prep.    Please follow these instructions carefully (unless otherwise directed):  Please have labs BMP one week prior to   Coronary CTA Test   On the Night Before the Test: . Be sure to Drink plenty of water. . Do not consume any caffeinated/decaffeinated beverages or chocolate 12 hours prior to your test. . Do not take any antihistamines 12 hours prior to your test.   On the Day of the Test: . Drink plenty of water. Do not drink any water within one hour of the test. . Do not eat any food 4 hours prior to the test. . You may take your regular medications prior to the test.  . Take metoprolol (Lopressor)  100 mg two hours prior to test. . HOLD Furosemide/Hydrochlorothiazide ( Lasix and Valsartan/hctz)  morning of the test. . FEMALES- please wear underwire-free bra if available          After the Test: . Drink  plenty of water. . After receiving IV contrast, you may experience a mild flushed feeling. This is normal. . On occasion, you may experience a mild rash up to 24 hours after the test. This is not dangerous. If this occurs, you can take Benadryl 25 mg and increase your fluid intake. . If you experience trouble breathing, this can be serious. If it is severe call 911 IMMEDIATELY. If it is mild, please call our office.    Once we have confirmed authorization from your insurance company, we will call you to set up a date and time for your test. Based on how quickly your insurance  processes prior authorizations requests, please allow up to 4 weeks to be contacted for scheduling your Cardiac CT appointment. Be advised that routine Cardiac CT appointments could be scheduled as many as 8 weeks after your provider has ordered it.  For non-scheduling related questions, please contact the cardiac imaging nurse navigator should you have any questions/concerns: Marchia Bond, Cardiac Imaging Nurse Navigator Burley Saver, Interim Cardiac Imaging Nurse [place] [facility] Direct Office Dial: [phone]   For scheduling needs, including cancellations and rescheduling, please call [place], [phone].    ZIO XT- Long Term Monitor Instructions   Your physician has requested you wear your ZIO patch monitor__14_____days.   This is a single patch monitor.  [employer] supplies one patch monitor per enrollment.  Additional stickers are not available.   Please do not apply patch if you will be having a Nuclear Stress Test, Echocardiogram, Cardiac CT, MRI, or Chest Xray during the time frame you would be wearing the monitor. The patch cannot be worn during these tests.  You cannot remove and re-apply the ZIO XT patch monitor.   Your ZIO patch monitor will be sent USPS Priority mail from [facility] directly to your home address. The monitor may also be mailed to a PO BOX if home delivery is not available.   It may take 3-5 days to receive your monitor after you have been enrolled.   Once you have received you monitor, please review enclosed instructions.  Your monitor has already been registered assigning a specific monitor serial # to you.   Applying the monitor   Shave hair from upper left chest.   Hold abrader disc by orange tab.  Rub abrader in 40 strokes over left upper chest as indicated in your monitor instructions.   Clean area with 4 enclosed alcohol pads .  Use all pads to assure are is cleaned thoroughly.  Let dry.   Apply patch as  indicated in monitor instructions.  Patch will be place under collarbone on left side of chest with arrow pointing upward.   Rub patch adhesive wings for 2 minutes.Remove white label marked "1".  Remove white label marked "2".  Rub patch adhesive wings for 2 additional minutes.   While looking in a mirror, press and release button in center of patch.  A small green light will flash 3-4 times .  This will be your only indicator the monitor has been turned on.     Do not shower for the first 24 hours.  You may shower after the first 24 hours.   Press button if you feel a symptom. You will hear a small click.  Record Date, Time and Symptom in the Patient Log Book.   When you are ready to remove patch, follow instructions on last 2 pages of Patient Log Book.  Stick patch monitor onto  last page of Patient Log Book.   Place Patient Log Book in [place] box.  Use locking tab on box and tape box closed securely.  The [employer] and [employer] has [employer] on it.  Please place in mailbox as soon as possible.  Your physician should have your test results approximately 7 days after the monitor has been mailed back to [facility].   Call [place] at [phone] if you have questions regarding your ZIO XT patch monitor.  Call them immediately if you see an orange light blinking on your monitor.   If your monitor falls off in less than 4 days contact our Monitor department at [phone].  If your monitor becomes loose or falls off after 4 days call [employer] at [phone] for suggestions on securing your monitor.

## 2020-09-01 NOTE — Progress Notes (Signed)
Primary Care Provider: Antonietta Jewel, MD [place] Cardiologist: Shelva Majestic, MD Electrophysiologist: None  Clinic Note: Chief Complaint  Patient presents with  . New Patient (Initial Visit)  . Chest Pain  . Shortness of Breath  . Edema    Legs and ankles.   ===================================  ASSESSMENT/PLAN   Problem List Items Addressed This Visit    Essential hypertension (Chronic)    Blood pressure is high, she is oxygenating edema.  She is on clonidine and metoprolol at high dose.  There are medications that are probably better for blood pressure control.  We will start with checking of BMP and lipid panel here soon.  I will start her on valsartan-HCTZ 40/12.5 mg.  Would like to transition off of clonidine.  Blood pressure medications in her demographic will be ARB, diuretic and calcium blocker.       Relevant Medications   valsartan-hydrochlorothiazide (DIOVAN-HCT) 80-12.5 MG tablet   Morbid obesity (HCC) (Chronic)    Quite likely leading to some of her exertional dyspnea.  Also associated with her OSA diagnosis.  In the interest of trying to get her comfortable with doing exercise, evaluation is warranted for chest discomfort and dyspnea      Hyperlipidemia (Chronic)    I do not see a recent lipid panel checked.  She is on lovastatin.  Depending on ischemic evaluation, we may want to consider being more aggressive.      Relevant Medications   valsartan-hydrochlorothiazide (DIOVAN-HCT) 80-12.5 MG tablet   Evaluate for Sleep apnea    Needs to get back in touch with our sleep study team.  If she has follow-up already with Dr. Claiborne Billings it would make sense for her to continue seeing him for primary cardiology care as well.      Precordial chest pain - Primary    Precordial chest pain that is somewhat difficult to tease out.  Seems the pain Vascepa skeletal nature as it is progressed with exertion.  With your body habitus I do not think a nuclear  stress to be all that helpful.  She previously had a GXT which was nonischemic.  She does have risk factors with first-degree relative having an MI, along with herself having hypertension hyperlipidemia and obesity.   Plan: Ischemic evaluation with Coronary CTA-FFR      Relevant Orders   EKG 12-Lead (Completed)   CT CORONARY MORPH W/CTA COR W/SCORE W/CA W/CM &/OR WO/CM   CT CORONARY FRACTIONAL FLOW RESERVE DATA PREP   CT CORONARY FRACTIONAL FLOW RESERVE FLUID ANALYSIS   Basic metabolic panel (Completed)   Basic metabolic panel   Heart palpitations    Cardiac telemetry palpitations are, not sure if this was associated with chest discomfort and. Plan: 14-day Zio patch monitor.      Relevant Orders   LONG TERM MONITOR (3-14 DAYS)   Basic metabolic panel (Completed)   Basic metabolic panel    Other Visit Diagnoses    Precordial pain       Relevant Orders   CT CORONARY FRACTIONAL FLOW RESERVE DATA PREP   CT CORONARY FRACTIONAL FLOW RESERVE FLUID ANALYSIS      ===================================  HPI:    Mallory Henderson is a morbidly obese 55 y.o. female with a PMH notable for HTN, HLD, and OSA who is being seen today for the evaluation of HYPERTENSION, CHEST PAIN, and LEG SWELLING at the request of Antonietta Jewel, MD.  Harmonie was seen by Dr. Claiborne Billings back in May 2017.  She indicated she been on blood pressure medication since roughly age 34.  She had just been started on cholesterol medicine.  She noticed chest tightness with walking along with significant anxiety.  Was concerned because her brother died of an MI at age 41.  Evaluated with echocardiogram and GXT noted below.  No ischemic changes.  There was also concern about sleep apnea.    She was last seen by Dr. Tresa Endo on May 28, 2017 -> she was again complaining of chest burning and stabbing sensations. =>   Mallory Henderson was last seen on January 10 by Dr. Roseanne Reno.  Unfortunately I cannot fully Mallory Henderson the handwritten note.  It  appears that she is noticing exercise related fatigue and shortness of breath along with headache and epigastric pain. COVID-19 infection January 2021  Recent Hospitalizations:.  ER visit-September 2021-right lower extremity injury.  Reviewed  CV studies:    The following studies were reviewed today: (if available, images/films reviewed: From Epic Chart or Care Everywhere) . ETT/GXT 01/06/2016: Ex 8:01 min.; 85% MPHR @ 7 min - reached 170 bpm = 90% MPHR. 10.3 METS nonischemic test by EKG. . TTE 01/06/2016: Normal LV size and function.  EF 55 to 60%.  No R WMA.  Normal atrial sizes.  Normal pressures   Diagnostic Sleep Study on 02/14/2016: overall AHI of 4.2 per hour, although the respiratory disturbance index was significantly elevated at 28.3 per hour and she had severe sleep apnea, doing rems sleep with an AHI of 30 per hour.  Oxygen desaturated to 85%.  There was reduction in REM sleep.   Apparently, she has been on oral titration, which was set up from 02/01/2017 with a pressure minimum at 4 of to 20 cm.  A download obtained from 02/24/2017 through 03/25/2017 shows 80% of usage stays and 70% with usage greater than 4 hours.  Her average use on days used is 5 hours and 31 minutes.  AHI is 1.1.  His moderate leak.  An Epworth's sleepiness scale score  ~9   Interval History:   Mallory Henderson Reason was scheduled to see me as well as Dr. Tresa Endo today.  She again reports episodes of chest tightness and pain/pressure.  Sometimes describes a burning symptoms sometimes as pressure.  She does have discomfort along the left and right sternal border, but notes that the symptoms also worsened with exertion.  She also has diffuse aches and pains in her hands and feet as well as joints.  She is taking multiple medications.  The myalgias are so bad that she has to take the Flexeril.  She has developed a significant amount of anxiety ever since having Covid 19, her breathing is worsened and she has not been able  to tolerate the CPAP for her OSA.  She has exertional dyspnea along with a chest tightness.  No real PND orthopnea. She does have right greater than left leg edema which is actually has gotten better.  The legs go down with foot elevation. She notes increased heart rates and palpitations associated lightheadedness and chest tightness at least 3 times a week.  These episodes last maybe 10 minutes.  She gets lightheaded and dizzy with near syncope, but no syncope.  CV Review of Symptoms (Summary): positive for - chest pain, dyspnea on exertion, edema, irregular heartbeat, orthopnea, palpitations, shortness of breath and Also near syncope associated with palpitations negative for - loss of consciousness, paroxysmal nocturnal dyspnea or TIA or amaurosis fugax.  Claudication  The  patient does not have symptoms concerning for COVID-19 infection (fever, chills, cough, or new shortness of breath).   REVIEWED OF SYSTEMS   Review of Systems  Constitutional: Positive for malaise/fatigue.  HENT: Positive for congestion. Negative for nosebleeds.   Respiratory: Positive for cough and shortness of breath (With exertion).        Suspected sleep apnea.  Gastrointestinal: Negative for blood in stool and melena.  Genitourinary: Negative for hematuria.  Musculoskeletal: Positive for back pain and joint pain (She has chronic pain from the knees and hips).       Chronic pain-on Percocet  Neurological: Positive for dizziness, weakness (Legs are weak) and headaches. Negative for focal weakness.  Psychiatric/Behavioral: Negative for depression and memory loss. The patient is nervous/anxious. The patient does not have insomnia.     I have reviewed and (if needed) personally updated the patient's problem list, medications, allergies, past medical and surgical history, social and family history.   PAST MEDICAL HISTORY   Past Medical History:  Diagnosis Date  . Chronic pain    Currently on Flexeril and  meloxicam.,  was given a prescription for by PCP.  Marland Kitchen COVID-19 virus infection Noticed shortness of breath, cough,   No shortness of breath, cough, chest tightness and fevers.  Malaise. => Not bothered by chronic pain  . Hyperlipidemia   . Hypertension   . Morbid obesity with BMI of 45.0-49.9, adult (HCC)    BMI 45.7.-250pounds.  . OSA (obstructive sleep apnea)    Not currently on CPAP.    PAST SURGICAL HISTORY   Past Surgical History:  Procedure Laterality Date  . ABDOMINAL SURGERY    . CESAREAN SECTION    . GRADUATED EXERCISE TOLERANCE TEST (ETT/GXT)   01/06/2016   Ex 8:01 min.; 85% MPHR @ 7 min - reached 170 bpm = 90% MPHR. 10.3 METS nonischemic test by EKG.  . SHOULDER SURGERY    . TRANSTHORACIC ECHOCARDIOGRAM  01/06/2016   Normal LV size and function.  EF 55 to 60%.  No R WMA.  Normal atrial sizes.  Normal pressures  . TUBAL LIGATION       There is no immunization history on file for this patient.  MEDICATIONS/ALLERGIES   Current Meds  Medication Sig  . albuterol (PROVENTIL HFA;VENTOLIN HFA) 108 (90 Base) MCG/ACT inhaler Inhale 1-2 puffs into the lungs every 6 (six) hours as needed for wheezing or shortness of breath.  . cloNIDine (CATAPRES) 0.1 MG tablet Take 0.1 mg by mouth 2 (two) times daily.  . cyclobenzaprine (FLEXERIL) 5 MG tablet Take 1 tablet (5 mg total) by mouth 3 (three) times daily as needed for muscle spasms.  . furosemide (LASIX) 40 MG tablet Take 1 tablet by mouth daily.  Marland Kitchen loratadine (CLARITIN) 10 MG tablet Take 1 tablet (10 mg total) by mouth daily.  Marland Kitchen lovastatin (MEVACOR) 40 MG tablet Take 1 tablet by mouth daily.  . meloxicam (MOBIC) 7.5 MG tablet Take 1 tablet (7.5 mg total) by mouth daily.  . metoprolol tartrate (LOPRESSOR) 100 MG tablet Take 1 tablet by mouth 2 (two) times daily.  . valsartan-hydrochlorothiazide (DIOVAN-HCT) 80-12.5 MG tablet Take 1/2 tablet of (80/12.5 mg ) by mouth  In the morning    Allergies  Allergen Reactions  .  Hydrocodone Itching    Itchy throat  . Tramadol Itching  . Latex Hives and Rash    SOCIAL HISTORY/FAMILY HISTORY   Reviewed in Epic:  Pertinent findings:  Social History   Tobacco Use  .  Smoking status: Never Smoker  . Smokeless tobacco: Never Used  Vaping Use  . Vaping Use: Never used  Substance Use Topics  . Alcohol use: Yes    Comment: wine socially  . Drug use: No   Social History   Social History Narrative  . Not on file   Family History  Problem Relation Age of Onset  . Hypertension Father   . Heart disease Brother   . Kidney disease Brother   . Heart attack Brother   . Diabetes Paternal Grandfather   . Heart disease Paternal Grandfather   . Healthy Mother   . Stroke Maternal Grandmother    1 of 3 Brothers died at age 63 of an MI.  A 2nd brother died of complications from diabetes mellitus, the other died early childhood.  Grandfather was a double amputee with PAD.  OBJCTIVE -PE, EKG, labs   Wt Readings from Last 3 Encounters:  09/01/20 250 lb (113.4 kg)  08/24/19 253 lb (114.8 kg)  11/13/17 254 lb (115.2 kg)    Physical Exam: BP (!) 150/90 (BP Location: Left Arm, Patient Position: Sitting, Cuff Size: Large)   Pulse 67   Ht $R'5\' 2"'Mg$  (1.575 m)   Wt 250 lb (113.4 kg)   BMI 45.73 kg/m  Physical Exam Vitals reviewed.  Constitutional:      General: She is not in acute distress.    Appearance: She is well-developed. She is obese. She is not ill-appearing or toxic-appearing.     Comments: Morbidly obese.  Well-groomed.  HENT:     Head: Normocephalic and atraumatic.  Neck:     Vascular: No carotid bruit, hepatojugular reflux or JVD.  Cardiovascular:     Rate and Rhythm: Normal rate and regular rhythm.  No extrasystoles are present.    Chest Wall: PMI is not displaced (Difficult to palpate due to body habitus).     Pulses: Decreased pulses (Difficult to palpate due to body habitus.).     Heart sounds: S1 normal and S2 normal. Heart sounds are distant. No  murmur (To exclude soft 1/6 SEM at RUSB.) heard. No friction rub. No gallop.   Pulmonary:     Effort: Pulmonary effort is normal.     Breath sounds: Normal breath sounds. No stridor.  Chest:     Chest wall: Tenderness (She does have tenderness along bilateral sternal borders more left than the right.  Somewhat improved with left sided chest tightness ) present.  Abdominal:     General: Bowel sounds are normal. There is no distension.     Comments: Obese.  Unable to assess HSM.  Musculoskeletal:        General: Swelling (Difficult to distinguish between swelling and fatty tissue.) present. Normal range of motion.     Cervical back: Normal range of motion and neck supple.     Right lower leg: Edema (1-2+) present.     Left lower leg: Edema (1+) present.  Skin:    General: Skin is warm and dry.  Neurological:     General: No focal deficit present.     Mental Status: She is alert and oriented to person, place, and time.     Motor: No weakness.     Gait: Gait (Mild slow, antalgic gait) normal.  Psychiatric:        Mood and Affect: Mood normal.        Behavior: Behavior normal.        Thought Content: Thought content normal.  Judgment: Judgment normal.     Adult ECG Report  Rate:  67 ;  Rhythm: normal sinus rhythm and Nonspecific ST-T wave changes.  Cannot exclude anterior MI, age-indeterminate.;   Narrative Interpretation: Borderline EKG  Recent Labs: Recent labs not available Lab Results  Component Value Date   CHOL 206 (H) 06/17/2013   HDL 44 06/17/2013   LDLCALC 133 (H) 06/17/2013   TRIG 143 06/17/2013   CHOLHDL 4.7 06/17/2013   Lab Results  Component Value Date   CREATININE 1.00 09/02/2020   BUN 14 09/02/2020   NA 142 09/02/2020   K 4.5 09/02/2020   CL 105 09/02/2020   CO2 22 09/02/2020   CBC Latest Ref Rng & Units 08/24/2019 05/07/2013 05/14/2008  WBC 4.0 - 10.5 K/uL 8.7 7.4 6.3  Hemoglobin 12.0 - 15.0 g/dL 12.9 11.7(L) 11.3(L)  Hematocrit 36.0 - 46.0 % 38.3  34.5(L) 34.2(L)  Platelets 150 - 400 K/uL 195 187 201    No results found for: TSH  ==================================================  COVID-19 Education: The signs and symptoms of COVID-19 were discussed with the patient and how to seek care for testing (follow up with PCP or arrange E-visit).   The importance of social distancing and COVID-19 vaccination was discussed today. The patient is practicing social distancing & Masking.   I spent a total of 68minutes with the patient spent in direct patient consultation.  Additional time spent with chart review  / charting (studies, outside notes, etc): 19 min Total Time: 54 min   Current medicines are reviewed at length with the patient today.  (+/- concerns) several musculoskeletal pains have been helped by the additional blood Flexeril.  She is not sure about the narcotics.  This visit occurred during the SARS-CoV-2 public health emergency.  Safety protocols were in place, including screening questions prior to the visit, additional usage of staff PPE, and extensive cleaning of exam room while observing appropriate contact time as indicated for disinfecting solutions.  Notice: This dictation was prepared with Dragon dictation along with smaller phrase technology. Any transcriptional errors that result from this process are unintentional and may not be corrected upon review.  Patient Instructions / Medication Changes & Studies & Tests Ordered   Patient Instructions  Medication Instructions:    start  taking Valsartan/ hctz  1/2 tablet of 80/12.5 mg daily  ( preferably in the morning)    *If you need a refill on your cardiac medications before your next appointment, please call your pharmacy*   Lab Work:  BMP -- before starting Valsartan /hctz   1/2 tablet of 80/12.5 mg  daily   If you have labs (blood work) drawn today and your tests are completely normal, you will receive your results only by: Marland Kitchen MyChart Message (if you have  MyChart) OR . A paper copy in the mail If you have any lab test that is abnormal or we need to change your treatment, we will call you to review the results.   Testing/Procedures: Once we receive insurance authorization  -you will be schedule at [place] physician has requested that you have cardiac CTA. Cardiac computed tomography (CT) is a painless test that uses an x-ray machine to take clear, detailed pictures of your heart.  Please follow instruction sheet as given.   and This will be mailed to you in 5 to 10 days Your physician has recommended that you wear a  14 day Zio holter monitor. Holter monitors are medical devices that record the  heart's electrical activity. Doctors most often use these monitors to diagnose arrhythmias. Arrhythmias are problems with the speed or rhythm of the heartbeat. The monitor is a small, portable device. You can wear one while you do your normal daily activities. This is usually used to diagnose what is causing palpitations/syncope (passing out).    Follow-Up: At [facility], you and your health needs are our priority.  As part of our continuing mission to provide you with exceptional heart care, we have created designated Provider Care Teams.  These Care Teams include your primary Cardiologist (physician) and Advanced Practice Providers (APPs -  Physician Assistants and Nurse Practitioners) who all work together to provide you with the care you need, when you need it.     Your next appointment:   2 month(s)  The format for your next appointment:   In Person  Provider:   Glenetta Hew, MD   Other Instructions   Your cardiac CT will be scheduled at  the below location:   [facility] [address] [place], [place] [address] [phone]   Please arrive at the [facility] main entrance of [facility] 30 minutes prior to test start time. Proceed to the [facility] Radiology Department (first  floor) to check-in and test prep.    Please follow these instructions carefully (unless otherwise directed):  Please have labs BMP one week prior to   Coronary CTA Test   On the Night Before the Test: . Be sure to Drink plenty of water. . Do not consume any caffeinated/decaffeinated beverages or chocolate 12 hours prior to your test. . Do not take any antihistamines 12 hours prior to your test.   On the Day of the Test: . Drink plenty of water. Do not drink any water within one hour of the test. . Do not eat any food 4 hours prior to the test. . You may take your regular medications prior to the test.  . Take metoprolol (Lopressor)  100 mg two hours prior to test. . HOLD Furosemide/Hydrochlorothiazide ( Lasix and Valsartan/hctz)  morning of the test. . FEMALES- please wear underwire-free bra if available          After the Test: . Drink plenty of water. . After receiving IV contrast, you may experience a mild flushed feeling. This is normal. . On occasion, you may experience a mild rash up to 24 hours after the test. This is not dangerous. If this occurs, you can take Benadryl 25 mg and increase your fluid intake. . If you experience trouble breathing, this can be serious. If it is severe call 911 IMMEDIATELY. If it is mild, please call our office.    Once we have confirmed authorization from your insurance company, we will call you to set up a date and time for your test. Based on how quickly your insurance processes prior authorizations requests, please allow up to 4 weeks to be contacted for scheduling your Cardiac CT appointment. Be advised that routine Cardiac CT appointments could be scheduled as many as 8 weeks after your provider has ordered it.  For non-scheduling related questions, please contact the cardiac imaging nurse navigator should you have any questions/concerns: Marchia Bond, Cardiac Imaging Nurse Navigator Burley Saver, Interim Cardiac Imaging Nurse  [place] [facility] Direct Office Dial: [phone]   For scheduling needs, including cancellations and rescheduling, please call [place], [phone].    ZIO XT- Long Term Monitor Instructions   Your physician  has requested you wear your ZIO patch monitor__14_____days.   This is a single patch monitor.  [employer] supplies one patch monitor per enrollment.  Additional stickers are not available.   Please do not apply patch if you will be having a Nuclear Stress Test, Echocardiogram, Cardiac CT, MRI, or Chest Xray during the time frame you would be wearing the monitor. The patch cannot be worn during these tests.  You cannot remove and re-apply the ZIO XT patch monitor.   Your ZIO patch monitor will be sent USPS Priority mail from [facility] directly to your home address. The monitor may also be mailed to a PO BOX if home delivery is not available.   It may take 3-5 days to receive your monitor after you have been enrolled.   Once you have received you monitor, please review enclosed instructions.  Your monitor has already been registered assigning a specific monitor serial # to you.   Applying the monitor   Shave hair from upper left chest.   Hold abrader disc by orange tab.  Rub abrader in 40 strokes over left upper chest as indicated in your monitor instructions.   Clean area with 4 enclosed alcohol pads .  Use all pads to assure are is cleaned thoroughly.  Let dry.   Apply patch as indicated in monitor instructions.  Patch will be place under collarbone on left side of chest with arrow pointing upward.   Rub patch adhesive wings for 2 minutes.Remove white label marked "1".  Remove white label marked "2".  Rub patch adhesive wings for 2 additional minutes.   While looking in a mirror, press and release button in center of patch.  A small green light will flash 3-4 times .  This will be your only indicator the monitor has been turned on.      Do not shower for the first 24 hours.  You may shower after the first 24 hours.   Press button if you feel a symptom. You will hear a small click.  Record Date, Time and Symptom in the Patient Log Book.   When you are ready to remove patch, follow instructions on last 2 pages of Patient Log Book.  Stick patch monitor onto last page of Patient Log Book.   Place Patient Log Book in [place] box.  Use locking tab on box and tape box closed securely.  The [employer] has [employer] on it.  Please place in mailbox as soon as possible.  Your physician should have your test results approximately 7 days after the monitor has been mailed back to [facility].   Call [place] at [phone] if you have questions regarding your ZIO XT patch monitor.  Call them immediately if you see an orange light blinking on your monitor.   If your monitor falls off in less than 4 days contact our Monitor department at [phone].  If your monitor becomes loose or falls off after 4 days call [employer] at [phone] for suggestions on securing your monitor.       Studies Ordered:   Orders Placed This Encounter  Procedures  . CT CORONARY MORPH W/CTA COR W/SCORE W/CA W/CM &/OR WO/CM  . CT CORONARY FRACTIONAL FLOW RESERVE DATA PREP  . CT CORONARY FRACTIONAL FLOW RESERVE FLUID ANALYSIS  . Basic metabolic panel  . Basic metabolic panel  . LONG TERM MONITOR (3-14 DAYS)  . EKG 12-Lead     Glenetta Hew, M.D., M.S. Interventional Cardiologist  Pager # [phone] Phone # [phone] [address] [place], [place] [address]   Thank you for choosing Heartcare at [facility]!!

## 2020-09-02 ENCOUNTER — Telehealth: Payer: Self-pay | Admitting: *Deleted

## 2020-09-02 NOTE — Telephone Encounter (Signed)
Patient was seen by Dr Ellyn Hack today and at his request I called the patient to see if I could help resolve her CPAP issues. She informed me that she cannot tolerate the sleep mask that she has. Upon further conversation she informed me that she has not used her CPAP machine in about 1 & 1/2 years. I told her that in order for her get restarted on her therapy with the proper mask, she will need appointment to be seen by Dr Claiborne Billings. According to the chart she saw him last in 2018. Patient agrees to having appointment. Staff message sent to scheduler to call patient with appointment date and time.

## 2020-09-03 ENCOUNTER — Telehealth: Payer: Self-pay | Admitting: Cardiology

## 2020-09-03 LAB — BASIC METABOLIC PANEL
BUN/Creatinine Ratio: 14 (ref 9–23)
BUN: 14 mg/dL (ref 6–24)
CO2: 22 mmol/L (ref 20–29)
Calcium: 9.3 mg/dL (ref 8.7–10.2)
Chloride: 105 mmol/L (ref 96–106)
Creatinine, Ser: 1 mg/dL (ref 0.57–1.00)
GFR calc Af Amer: 74 mL/min/{1.73_m2} (ref >59)
GFR calc non Af Amer: 64 mL/min/{1.73_m2} (ref >59)
Glucose: 90 mg/dL (ref 65–99)
Potassium: 4.5 mmol/L (ref 3.5–5.2)
Sodium: 142 mmol/L (ref 134–144)

## 2020-09-03 NOTE — Telephone Encounter (Signed)
Spoke with pt, aware the bmp is normal. She was advised to start the valsartan-hct tomorrow tomorrow morning.

## 2020-09-03 NOTE — Telephone Encounter (Signed)
Follow Up:   Pt saw lab results on My Chart, she needs somebody to explain it to her please. She also wants to know when does she start taking this medicine(Valsartan)?

## 2020-09-04 ENCOUNTER — Encounter: Payer: Self-pay | Admitting: Cardiology

## 2020-09-04 NOTE — Assessment & Plan Note (Signed)
Cardiac telemetry palpitations are, not sure if this was associated with chest discomfort and. Plan: 14-day Zio patch monitor.

## 2020-09-04 NOTE — Assessment & Plan Note (Signed)
Blood pressure is high, she is oxygenating edema.  She is on clonidine and metoprolol at high dose.  There are medications that are probably better for blood pressure control.  We will start with checking of BMP and lipid panel here soon.  I will start her on valsartan-HCTZ 40/12.5 mg.  Would like to transition off of clonidine.  Blood pressure medications in her demographic will be ARB, diuretic and calcium blocker.

## 2020-09-04 NOTE — Assessment & Plan Note (Signed)
Precordial chest pain that is somewhat difficult to tease out.  Seems the pain Vascepa skeletal nature as it is progressed with exertion.  With your body habitus I do not think a nuclear stress to be all that helpful.  She previously had a GXT which was nonischemic.  She does have risk factors with first-degree relative having an MI, along with herself having hypertension hyperlipidemia and obesity.   Plan: Ischemic evaluation with Coronary CTA-FFR

## 2020-09-04 NOTE — Assessment & Plan Note (Signed)
Quite likely leading to some of her exertional dyspnea.  Also associated with her OSA diagnosis.  In the interest of trying to get her comfortable with doing exercise, evaluation is warranted for chest discomfort and dyspnea

## 2020-09-04 NOTE — Assessment & Plan Note (Signed)
Needs to get back in touch with our sleep study team.  If she has follow-up already with Dr. Claiborne Billings it would make sense for her to continue seeing him for primary cardiology care as well.

## 2020-09-04 NOTE — Assessment & Plan Note (Signed)
I do not see a recent lipid panel checked.  She is on lovastatin.  Depending on ischemic evaluation, we may want to consider being more aggressive.

## 2020-09-07 NOTE — Telephone Encounter (Signed)
Thanks  ->  Glenetta Hew, MD

## 2020-09-15 ENCOUNTER — Telehealth: Payer: Self-pay | Admitting: *Deleted

## 2020-09-15 NOTE — Telephone Encounter (Signed)
Reviewed different error signals on her monitor battery and how to troubleshoot issues.  Red light on X typically indicates poor sticker contact.  Make sure strip securely attached to skin or replace.

## 2020-09-15 NOTE — Telephone Encounter (Signed)
Patient called and stated that she needed to talk with someone about her heart monitor. Stated that is keeps blinking red. Please call to discuss

## 2020-09-24 ENCOUNTER — Telehealth (HOSPITAL_COMMUNITY): Payer: Self-pay | Admitting: Emergency Medicine

## 2020-09-24 DIAGNOSIS — R002 Palpitations: Secondary | ICD-10-CM

## 2020-09-24 NOTE — Telephone Encounter (Signed)
Pt wearing ZIO patch and wishes to push out CCTA so she can wear the patch her full 14 days.   Pt does not have her calendar on her at this time to r/s the appt but has my callback number.   Marchia Bond RN Navigator Cardiac Imaging [facility] [phone] Office  [phone] Cell

## 2020-09-27 ENCOUNTER — Ambulatory Visit (HOSPITAL_COMMUNITY): Payer: 59

## 2020-09-30 ENCOUNTER — Encounter: Payer: Self-pay | Admitting: Cardiovascular Disease

## 2020-09-30 ENCOUNTER — Other Ambulatory Visit: Payer: Self-pay

## 2020-09-30 ENCOUNTER — Ambulatory Visit (INDEPENDENT_AMBULATORY_CARE_PROVIDER_SITE_OTHER): Payer: 59 | Admitting: Cardiovascular Disease

## 2020-09-30 VITALS — BP 140/90 | HR 64 | Ht 62.0 in | Wt 250.6 lb

## 2020-09-30 DIAGNOSIS — G4733 Obstructive sleep apnea (adult) (pediatric): Secondary | ICD-10-CM | POA: Diagnosis not present

## 2020-09-30 DIAGNOSIS — I10 Essential (primary) hypertension: Secondary | ICD-10-CM

## 2020-09-30 DIAGNOSIS — E7849 Other hyperlipidemia: Secondary | ICD-10-CM | POA: Diagnosis not present

## 2020-09-30 NOTE — Patient Instructions (Signed)
Medication Instructions:  The current medical regimen is effective;  continue present plan and medications.  *If you need a refill on your cardiac medications before your next appointment, please call your pharmacy*  Follow-Up: At [facility], you and your health needs are our priority.  As part of our continuing mission to provide you with exceptional heart care, we have created designated Provider Care Teams.  These Care Teams include your primary Cardiologist (physician) and Advanced Practice Providers (APPs -  Physician Assistants and Nurse Practitioners) who all work together to provide you with the care you need, when you need it.  We recommend signing up for the patient portal called "MyChart".  Sign up information is provided on this After Visit Summary.  MyChart is used to connect with patients for Virtual Visits (Telemedicine).  Patients are able to view lab/test results, encounter notes, upcoming appointments, etc.  Non-urgent messages can be sent to your provider as well.   To learn more about what you can do with MyChart, go to [email or web address].    Your next appointment:   3 month(s)  The format for your next appointment:   In Person  Provider:   Shelva Majestic, MD   Other Instructions We will set you up for an in lab sleep study.

## 2020-10-02 ENCOUNTER — Encounter: Payer: Self-pay | Admitting: Cardiovascular Disease

## 2020-10-02 NOTE — Progress Notes (Signed)
Patient ID: Mallory Henderson, female   DOB: Oct 09, 1965, 55 y.o.   MRN: [id number]     Primary MD: Dr. Lady Gary  PATIENT PROFILE: Mallory Henderson is a 55 y.o. female who was initially referred to me through the courtesy of Dr. Jerral Bonito at [facility] [place] in [place] for evaluation of chest pressure.  I last evaluated in October 2018 following initiation of CPAP therapy.  HPI:  Mallory Henderson has a history of hypertension, hyperlipidemia, and obesity.  She states that she has been on blood pressure medication for approximately 10 years.  She was started on cholesterol medication 4 months Henderson.  Recently, she has noticed a change in symptoms with walking where she experiences episodes of chest tightness.  She also has noticed episodes of chest burning and stabbing.  She has a family history of peripheral vascular disease with her grandfather being a double amputee.  Her brother died at age 24 with myocardial infarction.  I referred her for a stress test which was negative for ischemia.  Echo Doppler study showed an EF of 55-60% with normal wall motion and valves.  Her sleep is very poor.  Typically she wakes up several times per night.  She goes to bed at 9:30 PM and wakes up at 6.  She snores loudly.  Her sleep is nonrestorative.  She feels sluggish when she awakens.  At times she has noticed a vague chest sensation which awakens her.    When I initially saw her, I was concerned about obstructive sleep apnea and referred her for sleep evaluation.  She underwent a diagnostic sleep study on 02/14/2016 which showed an overall AHI of 4.2 per hour, although the respiratory disturbance index was significantly elevated at 28.3 per hour and she had severe sleep apnea, doing REM sleep with an AHI of 30 per hour.  Oxygen desaturated to 85%.  There was reduction in REM sleep.   She initiated AutoPap which was set up from 02/01/2017 with a pressure minimum at 4 of to 20 cm.  I saw her for follow-up evaluation  May 28 2017.  A download obtained from 02/24/2017 through 03/25/2017 showed 80% of usage stays and 70% with usage greater than 4 hours.  Her average use on days used is 5 hours and 31 minutes.  AHI is 1.1.  His moderate leak.  An Epworth's sleepiness scale score was calculated in the office today and this endorsed at 9.    I had not seen the patient since 2018.  She was seen by Dr. Ellyn Hack on September 01, 2020.  Her blood pressure was elevated despite being on clonidine and metoprolol.  Valsartan HCT was initiated.  During that evaluation, she had not used CPAP for well over a year and a half.  Nurse coordinator was contacted.  In the past she had significant mask issues and is now scheduled to see me for sleep reevaluation.  Presently, she sleeps poorly.  She goes to bed around 10:30 PM and wakes up at 6 AM.  She has nocturia at least 2 times per night.  There is evidence for snoring and she awakens gasping for breath and admits to significant fatigability.  An Epworth Sleepiness Scale score was calculated in the office today and this endorsed at 8.   Past Medical History:  Diagnosis Date  . Chronic pain    Currently on Flexeril and meloxicam.,  was given a prescription for by PCP.  Marland Kitchen COVID-19 virus infection Noticed  shortness of breath, cough,   No shortness of breath, cough, chest tightness and fevers.  Malaise. => Not bothered by chronic pain  . Hyperlipidemia   . Hypertension   . Morbid obesity with BMI of 45.0-49.9, adult (HCC)    BMI 45.7.-250pounds.  . OSA (obstructive sleep apnea)    Not currently on CPAP.    Past Surgical History:  Procedure Laterality Date  . ABDOMINAL SURGERY    . CESAREAN SECTION    . GRADUATED EXERCISE TOLERANCE TEST (ETT/GXT)   01/06/2016   Ex 8:01 min.; 85% MPHR @ 7 min - reached 170 bpm = 90% MPHR. 10.3 METS nonischemic test by EKG.  . SHOULDER SURGERY    . TRANSTHORACIC ECHOCARDIOGRAM  01/06/2016   Normal LV size and function.  EF 55 to 60%.  No R  WMA.  Normal atrial sizes.  Normal pressures  . TUBAL LIGATION      Allergies  Allergen Reactions  . Hydrocodone Itching    Itchy throat  . Tramadol Itching  . Latex Hives and Rash    Current Outpatient Medications  Medication Sig Dispense Refill  . albuterol (PROVENTIL HFA;VENTOLIN HFA) 108 (90 Base) MCG/ACT inhaler Inhale 1-2 puffs into the lungs every 6 (six) hours as needed for wheezing or shortness of breath. 1 Inhaler 0  . cloNIDine (CATAPRES) 0.1 MG tablet Take 0.1 mg by mouth 2 (two) times daily.    . furosemide (LASIX) 40 MG tablet Take 1 tablet by mouth daily.  11  . loratadine (CLARITIN) 10 MG tablet Take 1 tablet (10 mg total) by mouth daily. 14 tablet 0  . lovastatin (MEVACOR) 40 MG tablet Take 1 tablet by mouth daily.  11  . metoprolol tartrate (LOPRESSOR) 100 MG tablet Take 1 tablet by mouth 2 (two) times daily.  11  . valsartan-hydrochlorothiazide (DIOVAN-HCT) 80-12.5 MG tablet Take 1/2 tablet of (80/12.5 mg ) by mouth  In the morning (Patient taking differently: 1 tablet. by mouth in the morning) 45 tablet 2  . amitriptyline (ELAVIL) 25 MG tablet amitriptyline 25 mg tablet  TAKE 1 TABLET BY MOUTH AT BEDTIME     No current facility-administered medications for this visit.    Social History   Socioeconomic History  . Marital status: Single    Spouse name: Not on file  . Number of children: Not on file  . Years of education: Not on file  . Highest education level: Not on file  Occupational History  . Not on file  Tobacco Use  . Smoking status: Never Smoker  . Smokeless tobacco: Never Used  Vaping Use  . Vaping Use: Never used  Substance and Sexual Activity  . Alcohol use: Yes    Comment: wine socially  . Drug use: No  . Sexual activity: Not on file  Other Topics Concern  . Not on file  Social History Narrative  . Not on file   Social Determinants of Health   Financial Resource Strain: Not on file  Food Insecurity: Not on file  Transportation  Needs: Not on file  Physical Activity: Not on file  Stress: Not on file  Social Connections: Not on file  Intimate Partner Violence: Not on file   Additional social history is notable in that she is single. She has 4 children and lives at home with 2 of her children.  She works as a Chief Executive Officer.  She completed 12 grades of education.  There is no tobacco use.  She drinks occasional wine.  She does not exercise regular, but just joined [employer].   Family History  Problem Relation Age of Onset  . Hypertension Father   . Heart disease Brother   . Kidney disease Brother   . Heart attack Brother   . Diabetes Paternal Grandfather   . Heart disease Paternal Grandfather   . Healthy Mother   . Stroke Maternal Grandmother    Family history is notable in that her mother  has diabetes mellitus.  Her father died at age 27 with cancer of the throat.  The grandfather was an amputee due to peripheral vascular disease.  She has 3 deceased brothers, one died at age 63 with a myocardial infarction, one died at 12-week-old and another died at age 74 with complications from diabetes mellitus.  She has 3 living brothers.  ROS General: Negative; No fevers, chills, or night sweats HEENT: Negative; No changes in vision or hearing, sinus congestion, difficulty swallowing Pulmonary: Negative; No cough, wheezing, shortness of breath, hemoptysis Cardiovascular:  See HPI; GI: Negative; No nausea, vomiting, diarrhea, or abdominal pain GU: Negative; No dysuria, hematuria, or difficulty voiding Musculoskeletal: Negative; no myalgias, joint pain, or weakness Hematologic/Oncologic: Negative; no easy bruising, bleeding Endocrine: Negative; no heat/cold intolerance; no diabetes Neuro: Negative; no changes in balance, headaches Skin: Negative; No rashes or skin lesions Psychiatric: Positive for occasional anxiety Sleep: Poor sleep.  Nonrestorative sleep.  Snoring,  no bruxism, restless legs, hypnogagnic  hallucinations Other comprehensive 14 point system review is negative   Physical Exam BP 140/90   Pulse 64   Ht 5\' 2"  (1.575 m)   Wt 250 lb 9.6 oz (113.7 kg)   SpO2 99%   BMI 45.84 kg/m    Repeat blood pressure by me 124/86  Wt Readings from Last 3 Encounters:  09/30/20 250 lb 9.6 oz (113.7 kg)  09/01/20 250 lb (113.4 kg)  08/24/19 253 lb (114.8 kg)   General: Alert, oriented, no distress.  Skin: normal turgor, no rashes, warm and dry HEENT: Normocephalic, atraumatic. Pupils equal round and reactive to light; sclera anicteric; extraocular muscles intact;  Nose without nasal septal hypertrophy Mouth/Parynx benign; Mallinpatti scale 3/4 Neck: Thick neck; no JVD, no carotid bruits; normal carotid upstroke Lungs: clear to ausculatation and percussion; no wheezing or rales Chest wall: without tenderness to palpitation Heart: PMI not displaced, RRR, s1 s2 normal, 1/6 systolic murmur, no diastolic murmur, no rubs, gallops, thrills, or heaves Abdomen: Central adiposity; soft, nontender; no hepatosplenomehaly, BS+; abdominal aorta nontender and not dilated by palpation. Back: no CVA tenderness Pulses 2+ Musculoskeletal: full range of motion, normal strength, no joint deformities Extremities: no clubbing cyanosis or edema, Homan's sign negative  Neurologic: grossly nonfocal; Cranial nerves grossly wnl Psychologic: Normal mood and affect  I personally reviewed the ECG of September 01, 2020 which showed normal sinus rhythm at 67 bpm and poor R wave progression.  May 28, 2017 ECG (independently read by me): Sinus bradycardia with first-degree AV block.  Poor progression V1 through V3.  PR interval 216 ms.  LABS:  BMP Latest Ref Rng & Units 09/02/2020 08/24/2019 05/07/2013  Glucose 65 - 99 mg/dL 90 96 78  BUN 6 - 24 mg/dL 14 12 12   Creatinine 0.57 - 1.00 mg/dL 1.00 0.98 1.01  BUN/Creat Ratio 9 - 23 14 - -  Sodium 134 - 144 mmol/L 142 134(L) 137  Potassium 3.5 - 5.2 mmol/L 4.5 3.7 3.8   Chloride 96 - 106 mmol/L 105 100 102  CO2 20 - 29  mmol/L 22 22 24   Calcium 8.7 - 10.2 mg/dL 9.3 8.6(L) 9.2     No flowsheet data found.  CBC Latest Ref Rng & Units 08/24/2019 05/07/2013 05/14/2008  WBC 4.0 - 10.5 K/uL 8.7 7.4 6.3  Hemoglobin 12.0 - 15.0 g/dL 12.9 11.7(L) 11.3(L)  Hematocrit 36.0 - 46.0 % 38.3 34.5(L) 34.2(L)  Platelets 150 - 400 K/uL 195 187 201   Lab Results  Component Value Date   MCV 83.1 08/24/2019   MCV 82.3 05/07/2013   MCV 83.6 05/14/2008   No results found for: TSH Lab Results  Component Value Date   HGBA1C 5.2 06/17/2013     BNP No results found for: BNP  ProBNP    Component Value Date/Time   PROBNP 5.5 05/07/2013 2242     Lipid Panel     Component Value Date/Time   CHOL 206 (H) 06/17/2013 0921   TRIG 143 06/17/2013 0921   HDL 44 06/17/2013 0921   CHOLHDL 4.7 06/17/2013 0921   VLDL 29 06/17/2013 0921   LDLCALC 133 (H) 06/17/2013 0921    RADIOLOGY: No results found.   IMPRESSION:  1. Obstructive sleep apnea syndrome   2. Essential hypertension   3. Morbid obesity ([place])   4. Other hyperlipidemia    ASSESSMENT AND PLAN: Ms. Mallory Henderson is a 55 year old African-American female who has a history of morbid obesity, hypertension, hyperlipidemia, and  developed episodes of chest pressure.  Subsequent noninvasive evaluation with treadmill testing as well as echo Doppler study did not show any significant abnormality.  In 2018 she was found to have mild overall sleep apnea but this was severe during REM sleep with that REM AHI at 30.0/h.  When I last saw her in 2018 she was compliant.  However, Mallory Henderson.  Presently her sleep is poor.  Her blood pressure has been elevated and when recently seen by Dr. Ellyn Hack and valsartan/ HCT was added to her regimen of furosemide, metoprolol tartrate 100 mg twice a day and clonidine 0.1 mg twice a day.  She has had some issues with vertigo and recently  was started on meclizine.  She continues to be morbidly obese.  Presently she is having significant symptoms of her untreated sleep apnea with loud snoring, awakening gasping for breath, nonrestorative sleep, and nocturia.  After much discussion, she has agreed to undergo a reevaluation and I will set her up for an in lab sleep study which hopefully can be done in a split-night protocol if she meets criteria.  We discussed the importance of weight loss increased exercise.  We discussed optimal sleep duration at 7 to 8 hours per night.  She is on lovastatin for hyperlipidemia.  There is strong family history for heart disease.  She will be undergoing a coronary CTA as well as an event monitor ordered by Dr. Ellyn Hack.  I will see her in 3 months for reevaluation or sooner as needed   Troy Sine, MD, [facility] 10/02/2020 12:13 PM

## 2020-11-09 ENCOUNTER — Other Ambulatory Visit: Payer: Self-pay | Admitting: Cardiovascular Disease

## 2020-11-09 ENCOUNTER — Telehealth: Payer: Self-pay | Admitting: *Deleted

## 2020-11-09 DIAGNOSIS — G4733 Obstructive sleep apnea (adult) (pediatric): Secondary | ICD-10-CM

## 2020-11-09 NOTE — Telephone Encounter (Signed)
PA for split night sleep study faxed to [facility].

## 2020-11-10 ENCOUNTER — Telehealth (HOSPITAL_COMMUNITY): Payer: Self-pay | Admitting: *Deleted

## 2020-11-10 NOTE — Telephone Encounter (Signed)
Reaching out to patient to offer assistance regarding upcoming cardiac imaging study; pt verbalizes understanding of appt date/time, parking situation and where to check in, pre-test NPO status and medications ordered, and verified current allergies; name and call back number provided for further questions should they arise  Delos Klich RN Navigator Cardiac Imaging [place] [facility] [phone] office [phone] cell  Pt to take 100mg metoprolol tartrate 2 hours prior to scan. 

## 2020-11-11 ENCOUNTER — Encounter (HOSPITAL_COMMUNITY): Payer: Self-pay

## 2020-11-11 ENCOUNTER — Other Ambulatory Visit: Payer: Self-pay

## 2020-11-11 ENCOUNTER — Telehealth: Payer: Self-pay | Admitting: *Deleted

## 2020-11-11 ENCOUNTER — Telehealth: Payer: Self-pay | Admitting: Internal Medicine

## 2020-11-11 ENCOUNTER — Ambulatory Visit (HOSPITAL_COMMUNITY)
Admission: RE | Admit: 2020-11-11 | Discharge: 2020-11-11 | Disposition: A | Discharge status: Home / Self Care | Payer: 59 | Source: Ambulatory Visit | Attending: Cardiology | Admitting: Cardiology

## 2020-11-11 DIAGNOSIS — R072 Precordial pain: Secondary | ICD-10-CM | POA: Insufficient documentation

## 2020-11-11 DIAGNOSIS — Z006 Encounter for examination for normal comparison and control in clinical research program: Secondary | ICD-10-CM

## 2020-11-11 IMAGING — CT CT HEART MORP W/ CTA COR W/ SCORE W/ CA W/CM &/OR W/O CM
4 of 7 series · 8 of 20 positions shown, 9 images · non-contrast
Comparison: None.
COMPARISON: None.

Addendum:
EXAM:
OVER-READ INTERPRETATION  CT CHEST

The following report is an over-read performed by radiologist Dr.
TINTIE TSHIRE [REDACTED] on [DATE]. This over-read
does not include interpretation of cardiac or coronary anatomy or
pathology. The coronary CTA interpretation by the cardiologist is
attached.
CLINICAL DATA: Chest pain 54 year old female
Cardiac/Coronary CTA
TECHNIQUE: A non-contrast, gated CT scan was obtained with axial slices of 3 mm
through the heart for calcium scoring. Calcium scoring was performed
using the Agatston method. A 120 kV prospective, gated, contrast
cardiac scan was obtained. Gantry rotation speed was 250 msecs and
collimation was 0.6 mm. Two sublingual nitroglycerin tablets (0.8
mg) were given. The 3D data set was reconstructed in 5% intervals of
the 35-75% of the R-R cycle. Diastolic phases were analyzed on a
dedicated workstation using MPR, MIP, and VRT modes. The patient
received 80 cc of contrast.

[Series 6: best diast · axial · 0.39mm/px · z∈[-164,-126]mm · 2 of 293 slices shown, 3 images]
[im 98/293  vessel]
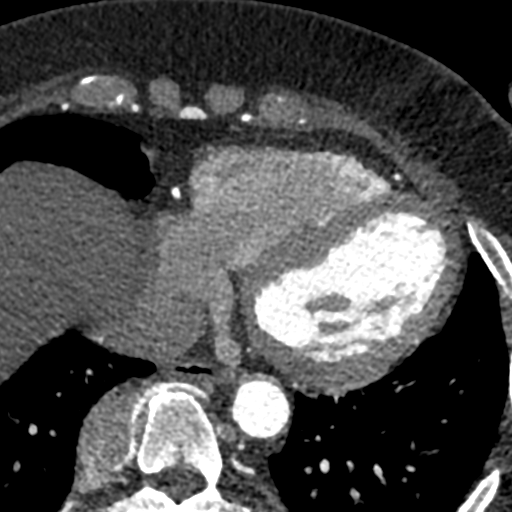
[im 98/293  lung]
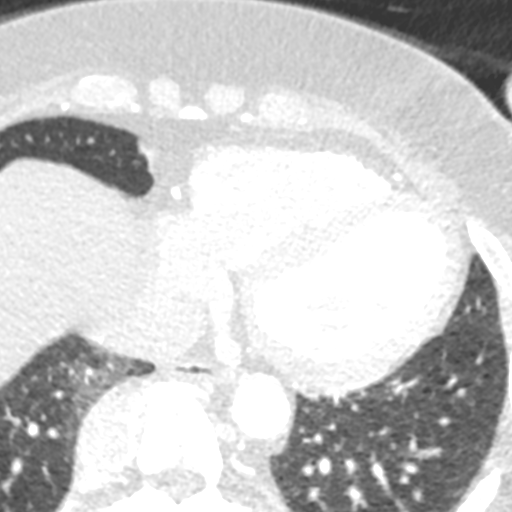
[im 195/293  vessel]
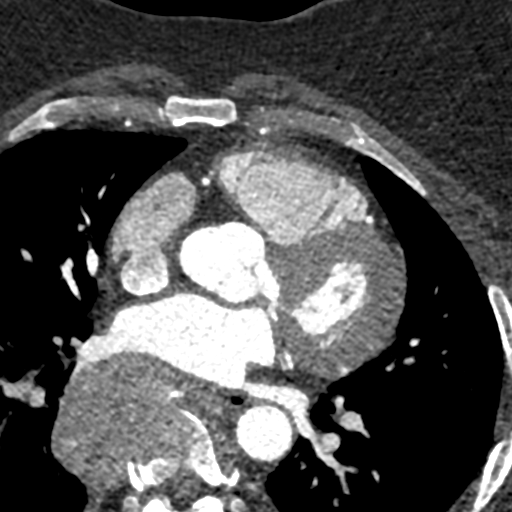

[Series 7: best syst · axial · 0.39mm/px · z∈[-164,-126]mm · 2 of 293 slices shown]
[im 98/293  vessel]
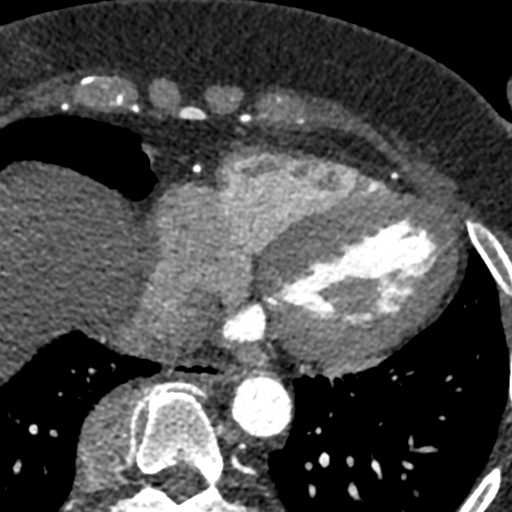
[im 195/293  vessel]
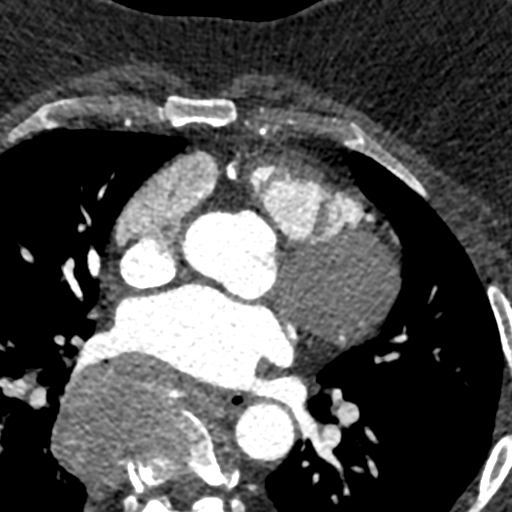

[Series 8: ts diast sharp · axial · 0.39mm/px · z∈[-164,-126]mm · 2 of 293 slices shown]
[im 98/293  lung]
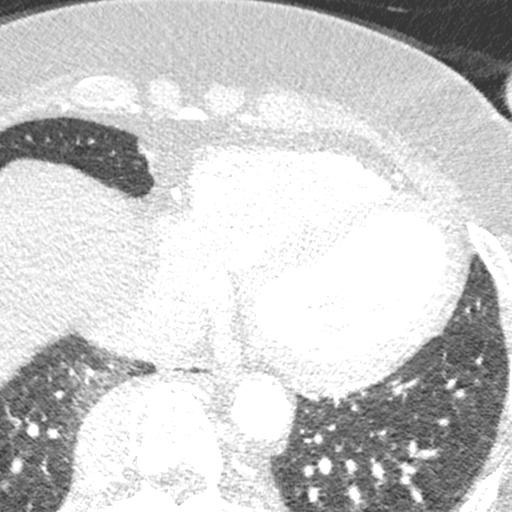
[im 195/293  lung]
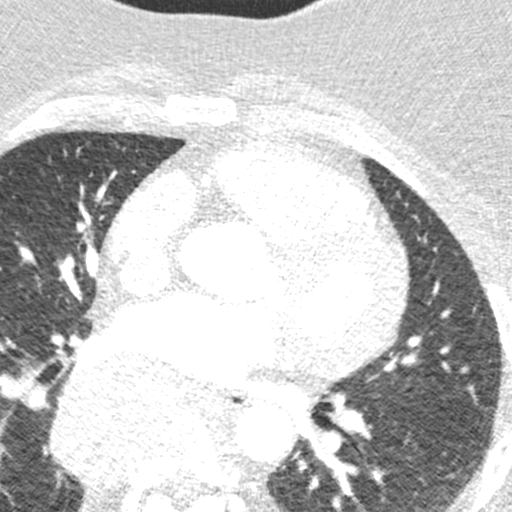

[Series 9: ts syst sharp · axial · 0.39mm/px · z∈[-164,-126]mm · 2 of 293 slices shown]
[im 98/293  lung]
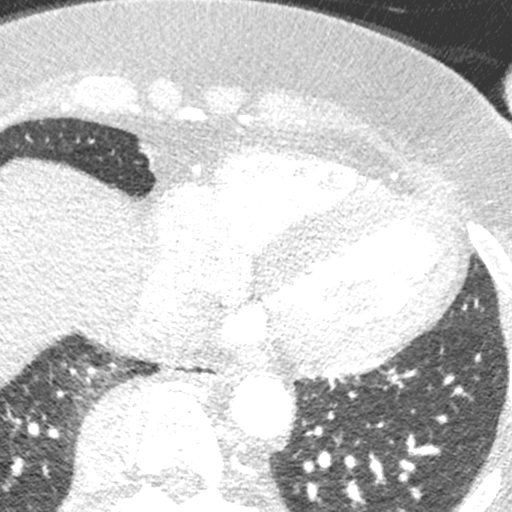
[im 195/293  lung]
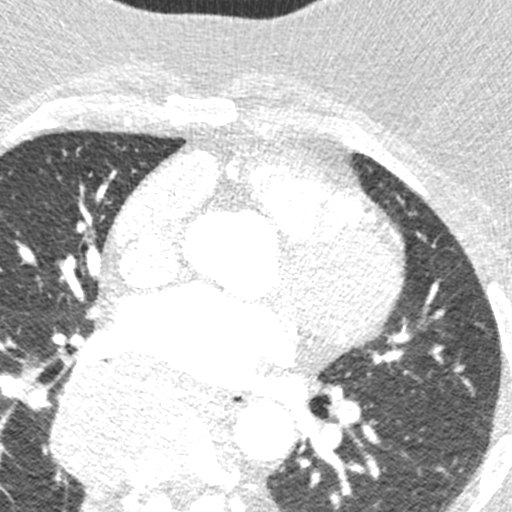

[8 of 20 positions shown; findings below may reference images not displayed]

FINDINGS: Vascular: Heart is normal size.  Aorta normal caliber.

Mediastinum/Nodes: No adenopathy

Lungs/Pleura: Right lower lobe pulmonary nodule on image 7 measures
7 mm. Right lower lobe pulmonary nodule on image 15 measures 6 mm.
Left lower lobe pulmonary nodule on the same image measures 4 mm. No
effusions.

Upper Abdomen: Imaging into the upper abdomen demonstrates no acute
findings.

Musculoskeletal: Large destructive mass seen involving 2 adjacent
vertebral bodies across the disc space. Exact levels are difficult
to determine on these coned images. There is associated large soft
tissue mass in the right posterior mediastinum adjacent to the
vertebral bodies which measures 6.5 x 6.1 x 3.3 cm. No additional
focal bone lesion is identified.
IMPRESSION: Large destructive mass involving 2 adjacent lower thoracic vertebral
bodies across the disc space with large adjacent soft tissue mass in
the right posterior mediastinum. The soft tissue mass measures 6.4 x
6.1 x 3.3 cm. Findings concerning for tumor, possibly metastasis.

Few small scattered nodules in the lower lobes measuring up to 7 mm.

These results were called by telephone at the time of interpretation
on [DATE] at [DATE] to providers Dr. TINTIE TSHIRE and Dr. TINTIE TSHIRE, Who
verbally acknowledged these results.
FINDINGS: Image quality: Excellent.

Noise artifact is: Limited.

Coronary Arteries:  Normal coronary origin.  Right dominance.

Left main: The left main is a large caliber vessel with a normal
take off from the left coronary cusp that bifurcates to form a left
anterior descending artery and a left circumflex artery. trifurcates
into a LAD, LCX, and ramus intermedius. There is no plaque or
stenosis.

Left anterior descending artery: The LAD is patent with focal small
calcified plaque, 0-24%. The LAD gives off 2 patent diagonal
branches.

Left circumflex artery: The LCX is non-dominant and patent with no
evidence of plaque or stenosis. The LCX gives off 2 patent obtuse
marginal branches.

Right coronary artery: The RCA is dominant with normal take off from
the right coronary cusp. There is no evidence of plaque or stenosis.
The RCA terminates as a PDA and right posterolateral branch without
evidence of plaque or stenosis.

Right Atrium: Right atrial size is within normal limits.

Right Ventricle: The right ventricular cavity is within normal
limits.

Left Atrium: Left atrial size is normal in size with no left atrial
appendage filling defect.

Left Ventricle: The ventricular cavity size is within normal limits.

Pulmonary arteries: Normal in size without proximal filling defect.

Pulmonary veins: Normal pulmonary venous drainage.

Pericardium: Normal thickness with no significant effusion or
calcium present.

Cardiac valves: The aortic valve is trileaflet without significant
calcification. The mitral valve is normal structure without
significant calcification.

Aorta: Normal caliber with no significant disease.

Extra-cardiac findings: See attached radiology report for
non-cardiac structures.
IMPRESSION: 1. Coronary calcium score of 0.61. This was 80 percentile for age
and sex matched controls.

2. Normal coronary origin with right dominance.

3. Small focal LAD calcified plaque, 0-24% stenosis, non flow
limiting.

4. See radiology report -- thoracic spine mass. Needs further
evaluation.

RECOMMENDATIONS:
CAD-RADS 1: Minimal non-obstructive CAD (0-24%). Consider
non-atherosclerotic causes of chest pain. Consider preventive
therapy and risk factor modification.

*** End of Addendum ***
EXAM:
OVER-READ INTERPRETATION  CT CHEST

The following report is an over-read performed by radiologist Dr.
TINTIE TSHIRE [REDACTED] on [DATE]. This over-read
does not include interpretation of cardiac or coronary anatomy or
pathology. The coronary CTA interpretation by the cardiologist is
attached.
FINDINGS: Vascular: Heart is normal size.  Aorta normal caliber.

Mediastinum/Nodes: No adenopathy

Lungs/Pleura: Right lower lobe pulmonary nodule on image 7 measures
7 mm. Right lower lobe pulmonary nodule on image 15 measures 6 mm.
Left lower lobe pulmonary nodule on the same image measures 4 mm. No
effusions.

Upper Abdomen: Imaging into the upper abdomen demonstrates no acute
findings.

Musculoskeletal: Large destructive mass seen involving 2 adjacent
vertebral bodies across the disc space. Exact levels are difficult
to determine on these coned images. There is associated large soft
tissue mass in the right posterior mediastinum adjacent to the
vertebral bodies which measures 6.5 x 6.1 x 3.3 cm. No additional
focal bone lesion is identified.
IMPRESSION: Large destructive mass involving 2 adjacent lower thoracic vertebral
bodies across the disc space with large adjacent soft tissue mass in
the right posterior mediastinum. The soft tissue mass measures 6.4 x
6.1 x 3.3 cm. Findings concerning for tumor, possibly metastasis.

Few small scattered nodules in the lower lobes measuring up to 7 mm.

These results were called by telephone at the time of interpretation
on [DATE] at [DATE] to providers Dr. TINTIE TSHIRE and Dr. TINTIE TSHIRE, Who
verbally acknowledged these results.

## 2020-11-11 MED ORDER — NITROGLYCERIN 0.4 MG SL SUBL: 0.8000 mg | SUBLINGUAL_TABLET | Freq: Once | SUBLINGUAL | Status: AC

## 2020-11-11 MED ORDER — NITROGLYCERIN 0.4 MG SL SUBL
SUBLINGUAL_TABLET | SUBLINGUAL | Status: AC
  Administered 2020-11-11: 0.8 mg via SUBLINGUAL
  Filled 2020-11-11: qty 2

## 2020-11-11 MED ORDER — IOHEXOL 350 MG/ML SOLN
95.0000 mL | Freq: Once | INTRAVENOUS | Status: AC | PRN
  Administered 2020-11-11: 95 mL via INTRAVENOUS

## 2020-11-11 NOTE — Research (Signed)
IDENTIFY Informed Consent                  Subject Name: Mallory Henderson   Subject met inclusion and exclusion criteria.  The informed consent form, study requirements and expectations were reviewed with the subject and questions and concerns were addressed prior to the signing of the consent form.  The subject verbalized understanding of the trial requirements.  The subject agreed to participate in the IDENTIFY trial and signed the informed consent at 12:22PM on 11/11/20.  The informed consent was obtained prior to performance of any protocol-specific procedures for the subject.  A copy of the signed informed consent was given to the subject and a copy was placed in the subject's medical record.   Meade Maw , Naval architect

## 2020-11-11 NOTE — Telephone Encounter (Signed)
Malachy Mood from [facility] states Dr. Rolm Baptise is requesting to speak with DOD. His personal number: [phone]

## 2020-11-11 NOTE — Telephone Encounter (Signed)
Left message that her sleep study appointment has been made. Please check her My chart or call me back for details.( I do not see DPR on file.)

## 2020-11-11 NOTE — Telephone Encounter (Signed)
Phone call today from Rolm Baptise, MD, radiology - I was the office DOD- patient had a coronary CT scan and was noted to have a large thoracic mass near the spine.  Additional work-up and imaging recommended, likely PET scan or thoracic referral.  Please see CT coronary angiogram report for details.  Pixie Casino, MD, [facility] [place] Director of the Advanced Lipid Disorders &  Cardiovascular Risk Reduction Clinic Diplomate of the American Board of Clinical Lipidology Attending Cardiologist  Direct Dial: [phone]  Fax: [phone]  Website:  [email or web address]

## 2020-11-11 NOTE — Progress Notes (Signed)
Pt called about results.  Mediastinal Mass with normal Coronaries. Dr. Marlou Porch apparently has forwarded to Heme-Onc.   I had mentioned referral to CT Sgx -- If Heme Onc is already scheduled, can see them 1st.  Just need to close the loop.  Glenetta Hew, MD

## 2020-11-11 NOTE — Telephone Encounter (Signed)
Returned phone call to Dr. Ardeen Garland- see telephone note.  Dr Debara Pickett

## 2020-11-12 ENCOUNTER — Telehealth: Payer: Self-pay | Admitting: *Deleted

## 2020-11-12 ENCOUNTER — Encounter: Payer: Self-pay | Admitting: *Deleted

## 2020-11-12 DIAGNOSIS — R918 Other nonspecific abnormal finding of lung field: Secondary | ICD-10-CM

## 2020-11-12 NOTE — Telephone Encounter (Signed)
I received a message from Dr. Alen Blew to help Dr. Marlou Porch get patient connected to [facility].  I called [name] to update her on next steps but was unable to reach her. I did leave vm message with my name and phone number to call.

## 2020-11-12 NOTE — Telephone Encounter (Signed)
Ms. Gronewold called me back. I updated her that [facility] office was closed today but she should get a call on Monday to get her scheduled to be seen.  She verbalized understanding.

## 2020-11-12 NOTE — Progress Notes (Signed)
Per Dr. Alen Blew and Dr. Marlou Porch, referral to [facility] completed. I will update their office. They would also like her case discussed at cancer conference.  I will update pathology to get her on the conference list for 4/21.

## 2020-11-16 ENCOUNTER — Telehealth: Payer: Self-pay | Admitting: *Deleted

## 2020-11-16 NOTE — Telephone Encounter (Signed)
I followed up on Mallory Henderson's schedule. She is set up to see Dr. Kipp Brood next week.  I asked her if she was aware of the office location and if she has any questions. She did not have any questions at this time.

## 2020-11-18 ENCOUNTER — Encounter: Payer: Self-pay | Admitting: *Deleted

## 2020-11-18 ENCOUNTER — Other Ambulatory Visit: Payer: Self-pay | Admitting: *Deleted

## 2020-11-18 NOTE — Progress Notes (Signed)
The proposed treatment discussed in cancer conference is for discussion purpose only and is not a binding recommendation. The patient was not physically examined nor present for their treatment options. Therefore, final treatment plans cannot be decided.  ?

## 2020-11-18 NOTE — Progress Notes (Signed)
I notified Dr. Marlou Porch or TB recommendations and her needing to be seen with neurosurgery.

## 2020-11-23 ENCOUNTER — Encounter: Payer: Self-pay | Admitting: Thoracic Surgery (Cardiothoracic Vascular Surgery)

## 2020-11-23 ENCOUNTER — Institutional Professional Consult (permissible substitution) (INDEPENDENT_AMBULATORY_CARE_PROVIDER_SITE_OTHER): Payer: 59 | Admitting: Thoracic Surgery (Cardiothoracic Vascular Surgery)

## 2020-11-23 ENCOUNTER — Other Ambulatory Visit: Payer: Self-pay | Admitting: Thoracic Surgery (Cardiothoracic Vascular Surgery)

## 2020-11-23 ENCOUNTER — Encounter: Payer: 59 | Admitting: Thoracic Surgery (Cardiothoracic Vascular Surgery)

## 2020-11-23 ENCOUNTER — Other Ambulatory Visit: Payer: Self-pay

## 2020-11-23 VITALS — BP 163/92 | HR 64 | Resp 20 | Ht 62.0 in | Wt 249.0 lb

## 2020-11-23 DIAGNOSIS — R918 Other nonspecific abnormal finding of lung field: Secondary | ICD-10-CM | POA: Diagnosis not present

## 2020-11-23 NOTE — Progress Notes (Signed)
[address]       [place] [address]             [phone]                    Mallory Henderson Medical Record #[id number] Date of Birth: 1966-05-22  Referring: Jerline Pain, MD Primary Care: Antonietta Jewel, MD Primary Cardiologist: Shelva Majestic, MD  Chief Complaint:    Chief Complaint  Patient presents with  . Lung Mass    Initial surgical consult, CT card 4/14    History of Present Illness:    Mallory Henderson 55 y.o. 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  The patient's was originally evaluated for shortness of breath and chest pain.  She subsequently underwent a cardiac CT which identified this mass.  Over the past 2 years she has complained of some back pain and was previously being seen by the spine center for work-related injury.  She did undergo an MRI of her lumbar spine which did not show this lesion.  She eventually was cleared, and then was evaluated by her cardiologist for her symptoms of shortness of breath.  Her weight has been stable.  She does endorse some paresthesias in her bilateral lower extremities as well as some pain in her right leg occasionally.  She is unsure of any aggravating factors.  She denies any other neurologic symptoms.    Past Medical History:  Diagnosis Date  . Chronic pain    Currently on Flexeril and meloxicam.,  was given a prescription for by PCP.  Marland Kitchen COVID-19 virus infection Noticed shortness of breath, cough,   No shortness of breath, cough, chest tightness and fevers.  Malaise. => Not bothered by chronic pain  . Hyperlipidemia   . Hypertension   . Morbid obesity with BMI of 45.0-49.9, adult (HCC)    BMI 45.7.-250pounds.  . OSA (obstructive sleep apnea)    Not currently on CPAP.    Past Surgical History:  Procedure Laterality Date  . ABDOMINAL SURGERY    . CESAREAN SECTION    . GRADUATED EXERCISE TOLERANCE TEST (ETT/GXT)   01/06/2016    Ex 8:01 min.; 85% MPHR @ 7 min - reached 170 bpm = 90% MPHR. 10.3 METS nonischemic test by EKG.  . SHOULDER SURGERY    . TRANSTHORACIC ECHOCARDIOGRAM  01/06/2016   Normal LV size and function.  EF 55 to 60%.  No R WMA.  Normal atrial sizes.  Normal pressures  . TUBAL LIGATION      Family History  Problem Relation Age of Onset  . Hypertension Father   . Heart disease Brother   . Kidney disease Brother   . Heart attack Brother   . Diabetes Paternal Grandfather   . Heart disease Paternal Grandfather   . Healthy Mother   . Stroke Maternal Grandmother      Social History   Tobacco Use  Smoking Status Never Smoker  Smokeless Tobacco Never Used    Social History   Substance and Sexual Activity  Alcohol Use Yes   Comment: wine socially     Allergies  Allergen Reactions  . Hydrocodone Itching    Itchy throat  . Tramadol Itching  . Latex Hives and Rash    Current Outpatient Medications  Medication Sig Dispense Refill  . albuterol (PROVENTIL HFA;VENTOLIN HFA) 108 (90 Base) MCG/ACT inhaler Inhale 1-2 puffs into the  lungs every 6 (six) hours as needed for wheezing or shortness of breath. 1 Inhaler 0  . amitriptyline (ELAVIL) 25 MG tablet amitriptyline 25 mg tablet  TAKE 1 TABLET BY MOUTH AT BEDTIME    . cloNIDine (CATAPRES) 0.1 MG tablet Take 0.1 mg by mouth 2 (two) times daily.    . furosemide (LASIX) 40 MG tablet Take 1 tablet by mouth daily.  11  . loratadine (CLARITIN) 10 MG tablet Take 1 tablet (10 mg total) by mouth daily. 14 tablet 0  . lovastatin (MEVACOR) 40 MG tablet Take 1 tablet by mouth daily.  11  . metoprolol tartrate (LOPRESSOR) 100 MG tablet Take 1 tablet by mouth 2 (two) times daily.  11  . valsartan-hydrochlorothiazide (DIOVAN-HCT) 80-12.5 MG tablet Take 1/2 tablet of (80/12.5 mg ) by mouth  In the morning (Patient taking differently: 1 tablet. by mouth in the morning) 45 tablet 2   No current facility-administered medications for this visit.    Review  of Systems  Constitutional: Negative.   Respiratory: Positive for shortness of breath.   Cardiovascular: Positive for chest pain.  Musculoskeletal: Positive for back pain and myalgias.  Neurological: Positive for tingling and sensory change.    PHYSICAL EXAMINATION: BP (!) 163/92 (BP Location: Right Arm, Patient Position: Sitting)   Pulse 64   Resp 20   Ht 5\' 2"  (1.575 m)   Wt 249 lb (112.9 kg)   SpO2 95% Comment: RA  BMI 45.54 kg/m   Physical Exam Constitutional:      General: She is not in acute distress.    Appearance: Normal appearance. She is obese. She is not ill-appearing.  HENT:     Head: Normocephalic and atraumatic.  Eyes:     Extraocular Movements: Extraocular movements intact.  Cardiovascular:     Rate and Rhythm: Normal rate.  Pulmonary:     Effort: Pulmonary effort is normal. No respiratory distress.  Musculoskeletal:        General: Normal range of motion.     Cervical back: Normal range of motion.  Skin:    General: Skin is warm and dry.  Neurological:     General: No focal deficit present.     Mental Status: She is alert and oriented to person, place, and time.      Diagnostic Studies & Laboratory data:     Recent Radiology Findings:   CT CORONARY MORPH W/CTA COR W/SCORE W/CA W/CM &/OR WO/CM  Addendum Date: 11/11/2020   ADDENDUM REPORT: 11/11/2020 18:11 CLINICAL DATA:  Chest pain 55 year old female EXAM: Cardiac/Coronary CTA TECHNIQUE: A non-contrast, gated CT scan was obtained with axial slices of 3 mm through the heart for calcium scoring. Calcium scoring was performed using the Agatston method. A 120 kV prospective, gated, contrast cardiac scan was obtained. Gantry rotation speed was 250 msecs and collimation was 0.6 mm. Two sublingual nitroglycerin tablets (0.8 mg) were given. The 3D data set was reconstructed in 5% intervals of the 35-75% of the R-R cycle. Diastolic phases were analyzed on a dedicated workstation using MPR, MIP, and VRT modes. The  patient received 80 cc of contrast. FINDINGS: Image quality: Excellent. Noise artifact is: Limited. Coronary Arteries:  Normal coronary origin.  Right dominance. Left main: The left main is a large caliber vessel with a normal take off from the left coronary cusp that bifurcates to form a left anterior descending artery and a left circumflex artery. trifurcates into a LAD, LCX, and ramus intermedius. There is no plaque  or stenosis. Left anterior descending artery: The LAD is patent with focal small calcified plaque, 0-24%. The LAD gives off 2 patent diagonal branches. Left circumflex artery: The LCX is non-dominant and patent with no evidence of plaque or stenosis. The LCX gives off 2 patent obtuse marginal branches. Right coronary artery: The RCA is dominant with normal take off from the right coronary cusp. There is no evidence of plaque or stenosis. The RCA terminates as a PDA and right posterolateral branch without evidence of plaque or stenosis. Right Atrium: Right atrial size is within normal limits. Right Ventricle: The right ventricular cavity is within normal limits. Left Atrium: Left atrial size is normal in size with no left atrial appendage filling defect. Left Ventricle: The ventricular cavity size is within normal limits. Pulmonary arteries: Normal in size without proximal filling defect. Pulmonary veins: Normal pulmonary venous drainage. Pericardium: Normal thickness with no significant effusion or calcium present. Cardiac valves: The aortic valve is trileaflet without significant calcification. The mitral valve is normal structure without significant calcification. Aorta: Normal caliber with no significant disease. Extra-cardiac findings: See attached radiology report for non-cardiac structures. IMPRESSION: 1. Coronary calcium score of 0.61. This was 28 percentile for age and sex matched controls. 2. Normal coronary origin with right dominance. 3. Small focal LAD calcified plaque, 0-24% stenosis, non  flow limiting. 4. See radiology report -- thoracic spine mass. Needs further evaluation. RECOMMENDATIONS: CAD-RADS 1: Minimal non-obstructive CAD (0-24%). Consider non-atherosclerotic causes of chest pain. Consider preventive therapy and risk factor modification. Candee Furbish, MD Electronically Signed   By: Candee Furbish MD   On: 11/11/2020 18:11   Result Date: 11/11/2020 EXAM: OVER-READ INTERPRETATION  CT CHEST The following report is an over-read performed by radiologist Dr. Rolm Baptise of [facility], [place] on 11/11/2020. This over-read does not include interpretation of cardiac or coronary anatomy or pathology. The coronary CTA interpretation by the cardiologist is attached. COMPARISON:  None. FINDINGS: Vascular: Heart is normal size.  Aorta normal caliber. Mediastinum/Nodes: No adenopathy Lungs/Pleura: Right lower lobe pulmonary nodule on image 7 measures 7 mm. Right lower lobe pulmonary nodule on image 15 measures 6 mm. Left lower lobe pulmonary nodule on the same image measures 4 mm. No effusions. Upper Abdomen: Imaging into the upper abdomen demonstrates no acute findings. Musculoskeletal: Large destructive mass seen involving 2 adjacent vertebral bodies across the disc space. Exact levels are difficult to determine on these coned images. There is associated large soft tissue mass in the right posterior mediastinum adjacent to the vertebral bodies which measures 6.5 x 6.1 x 3.3 cm. No additional focal bone lesion is identified. IMPRESSION: Large destructive mass involving 2 adjacent lower thoracic vertebral bodies across the disc space with large adjacent soft tissue mass in the right posterior mediastinum. The soft tissue mass measures 6.4 x 6.1 x 3.3 cm. 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 Few small scattered nodules in the lower lobes measuring up to 7 mm. These results were called by telephone at the time of interpretation on 11/11/2020 at 4:11 pm to providers Dr. Debara Pickett and Dr.  Marlou Porch, Who verbally acknowledged these results. Electronically Signed: By: Rolm Baptise M.D. On: 11/11/2020 16:52       I have independently reviewed the above radiology studies  and reviewed the findings with the patient.   Recent Lab Findings: Lab Results  Component Value Date   WBC 8.7 08/24/2019   HGB 12.9 08/24/2019   HCT 38.3 08/24/2019   PLT 195 08/24/2019   GLUCOSE  90 09/02/2020   CHOL 206 (H) 06/17/2013   TRIG 143 06/17/2013   HDL 44 06/17/2013   LDLCALC 133 (H) 06/17/2013   NA 142 09/02/2020   K 4.5 09/02/2020   CL 105 09/02/2020   CREATININE 1.00 09/02/2020   BUN 14 09/02/2020   CO2 22 09/02/2020   HGBA1C 5.2 06/17/2013         Assessment / Plan:   This is a 55 year old female with a 6.5 posterior mediastinal mass that appears to be invading to adjacent vertebral bodies on the right side.  She has some vague neurologic symptoms which is likely due to this mass as well.  I discussed the case with Dr. Rolena Infante and he will review the imaging to see if she was a candidate for surgical resection.  In the meantime I have ordered a PET/CT along with thoracic spine MRI.  I will see her once these images have resulted.       Lajuana Matte 11/23/2020 3:20 PM

## 2020-12-07 ENCOUNTER — Other Ambulatory Visit: Payer: Self-pay

## 2020-12-07 ENCOUNTER — Ambulatory Visit (HOSPITAL_COMMUNITY)
Admission: RE | Admit: 2020-12-07 | Discharge: 2020-12-07 | Disposition: A | Discharge status: Home / Self Care | Payer: 59 | Source: Ambulatory Visit | Attending: Thoracic Surgery (Cardiothoracic Vascular Surgery) | Admitting: Thoracic Surgery (Cardiothoracic Vascular Surgery)

## 2020-12-07 DIAGNOSIS — R918 Other nonspecific abnormal finding of lung field: Secondary | ICD-10-CM | POA: Insufficient documentation

## 2020-12-07 LAB — GLUCOSE, CAPILLARY: Glucose-Capillary: 95 mg/dL (ref 70–99)

## 2020-12-07 IMAGING — CT NM PET TUM IMG INITIAL (PI) SKULL BASE T - THIGH
1 of 8 series · 1 of 25 positions shown · non-contrast
Comparison: CT scan [DATE]

CLINICAL DATA: Initial treatment strategy for mediastinal mass.

EXAM:
NUCLEAR MEDICINE PET SKULL BASE TO THIGH
TECHNIQUE: 12.3 mCi F-18 FDG was injected intravenously. Full-ring PET imaging
was performed from the skull base to thigh after the radiotracer. CT
data was obtained and used for attenuation correction and anatomic
localization.
Fasting blood glucose: 95 mg/dl

[Series 4: ct sk_thigh 5.0 bf37 · axial · 5.0mm · 0.98mm/px · 1 of 229 slices shown]
[im 229/229  brain]
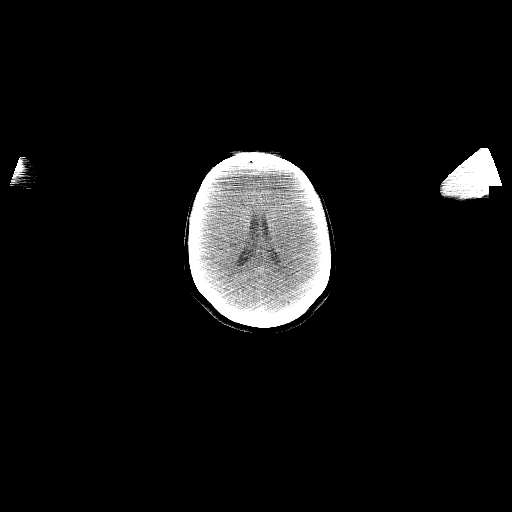

[1 of 25 positions shown; findings below may reference images not displayed]

FINDINGS: Mediastinal blood pool activity: SUV max

Liver activity: SUV max NA

NECK: No hypermetabolic lymph nodes in the neck. Diffuse symmetric
hypermetabolism along the tongue base and tonsillar regions likely
prominent lymphoid tissue and likely related to upper respiratory
infection. No mass.

Incidental CT findings: none

CHEST: The large right-sided posterior mediastinal mass is invading
and destroying the T8 [8U] vertebral bodies. It is markedly
hypermetabolic with SUV max of 17.74. Possibilities would include an
aggressive/malignant nerve sheath tumor, other neurogenic tumor,
chondrosarcoma, fibrosarcoma or less likely a metastatic lesion.

There are small but hypermetabolic mediastinal lymph nodes with SUV
max between 4.4 and 6.3 consistent with metastatic adenopathy.

No breast masses, supraclavicular or axillary adenopathy.

Small bilateral pulmonary nodules worrisome for pulmonary metastatic
disease. None of these are large enough to accurately assess with
PET-CT.

4.8 mm right lower lobe nodule on image [DATE].

5.5 mm right lower lobe nodule on image number 34/8.

4 mm left lower lobe pulmonary nodule on image number 34/8.

Incidental CT findings: none

ABDOMEN/PELVIS: No findings suspicious for abdominal/pelvic
metastatic disease. No hepatic or adrenal gland lesions. No
abdominal or pelvic lymphadenopathy.

Incidental CT findings: Status post cholecystectomy. No biliary
dilatation. Scattered aortic and iliac artery calcifications. The
uterus and ovaries are grossly normal.

SKELETON: No findings suspicious for osseous metastatic disease.

Incidental CT findings: none
IMPRESSION: 1. 6.5 cm right-sided posterior mediastinal mass invading and
destroying the T8 and T9 vertebral bodies is hypermetabolic and
consistent with neoplasm. There are also small but hypermetabolic
mediastinal lymph nodes consistent with metastatic disease. This
should be amenable to image guided biopsy.
2. Three pulmonary nodules are worrisome for pulmonary metastatic
disease.
3. No findings for abdominal/pelvic metastatic disease or osseous
metastatic disease.

## 2020-12-07 MED ORDER — FLUDEOXYGLUCOSE F - 18 (FDG) INJECTION
12.3400 | Freq: Once | INTRAVENOUS | Status: AC
  Administered 2020-12-07: 12.34 via INTRAVENOUS

## 2020-12-07 MED ORDER — TECHNETIUM TC 99M MEBROFENIN IV KIT: 5.4000 | PACK | Freq: Once | INTRAVENOUS | Status: DC

## 2020-12-08 ENCOUNTER — Ambulatory Visit
Admission: RE | Admit: 2020-12-08 | Discharge: 2020-12-08 | Disposition: A | Discharge status: Home / Self Care | Payer: 59 | Source: Ambulatory Visit | Attending: Thoracic Surgery (Cardiothoracic Vascular Surgery) | Admitting: Thoracic Surgery (Cardiothoracic Vascular Surgery)

## 2020-12-08 DIAGNOSIS — R918 Other nonspecific abnormal finding of lung field: Secondary | ICD-10-CM

## 2020-12-08 IMAGING — MR MR THORACIC SPINE WO/W CM
5 of 9 series · 25 of 48 positions shown · IV contrast (multihance)
Comparison: PET CT scan [DATE].

CLINICAL DATA: Posterior mediastinal mass invading the thoracic
spine.

EXAM:
MRI THORACIC WITHOUT AND WITH CONTRAST
TECHNIQUE: Multiplanar and multiecho pulse sequences of the thoracic spine were
obtained without and with intravenous contrast.
CONTRAST:  20 mL MULTIHANCE GADOBENATE DIMEGLUMINE 529 MG/ML IV SOLN

[Series 17: T1 · sagittal · 3.0mm · 0.83mm/px · 4 of 30 slices shown (1 of 2)]
[im 1/30]
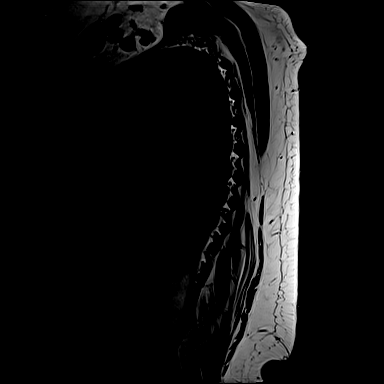
[im 10/30]
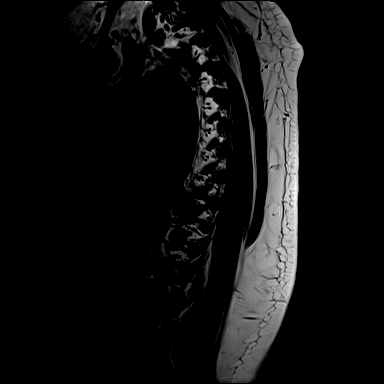
[im 20/30]
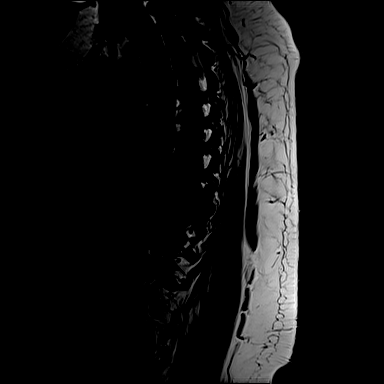
[im 30/30]
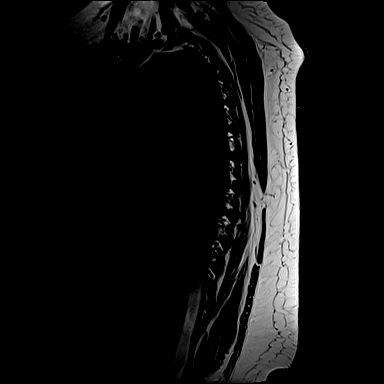

[Series 19: T2 · axial · 4.0mm · 0.28mm/px · z∈[-240,-35]mm · 7 of 46 slices shown (1 of 2)]
[im 1/46]
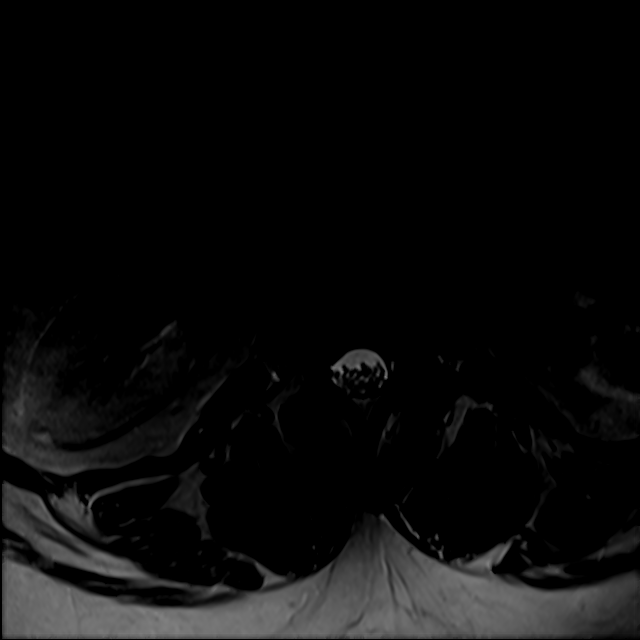
[im 8/46]
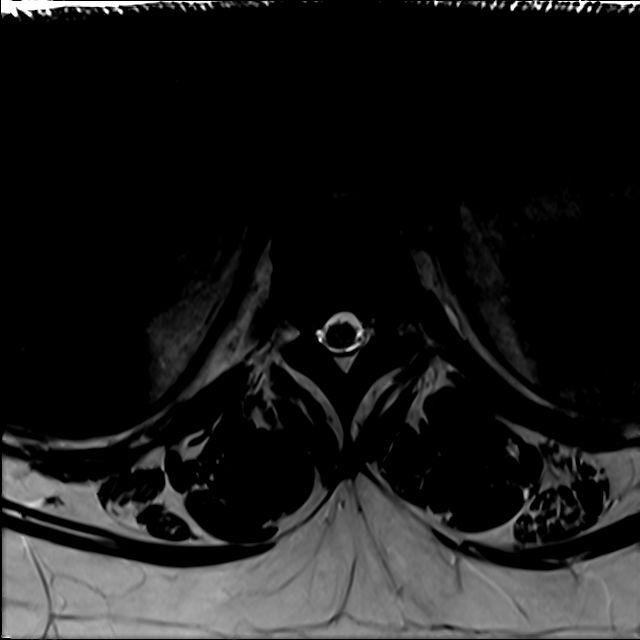
[im 16/46]
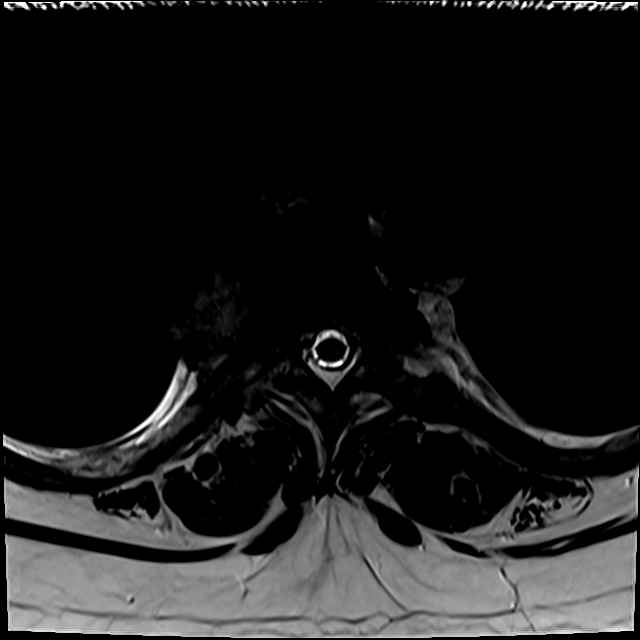
[im 23/46]
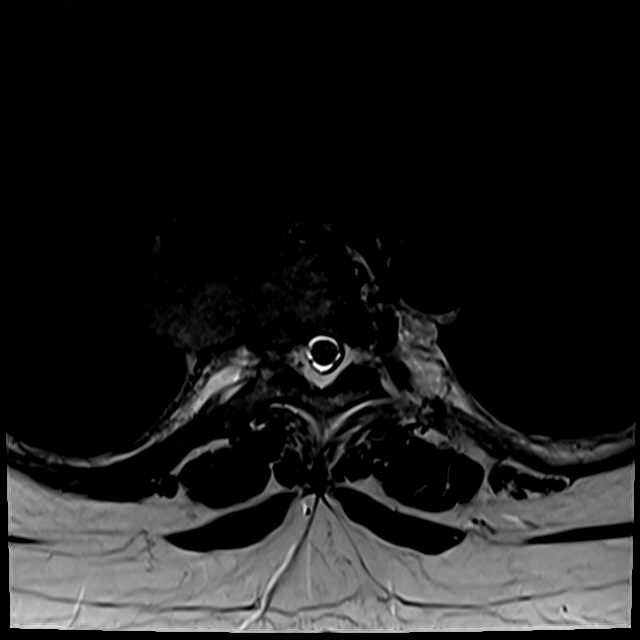
[im 31/46]
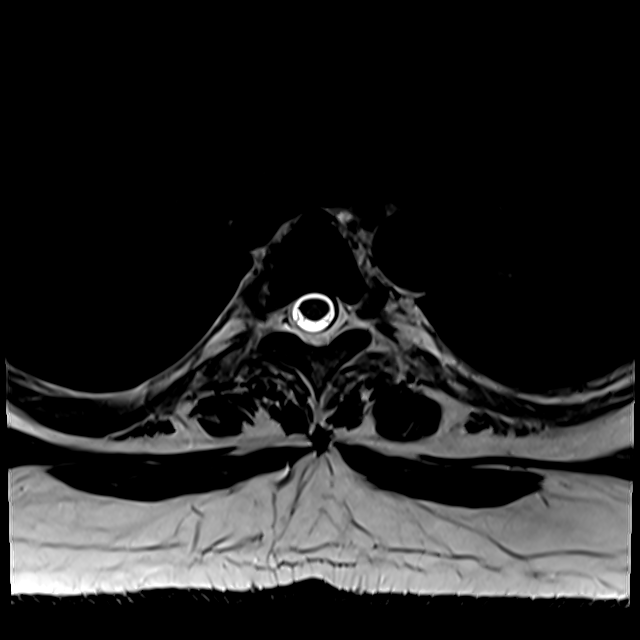
[im 38/46]
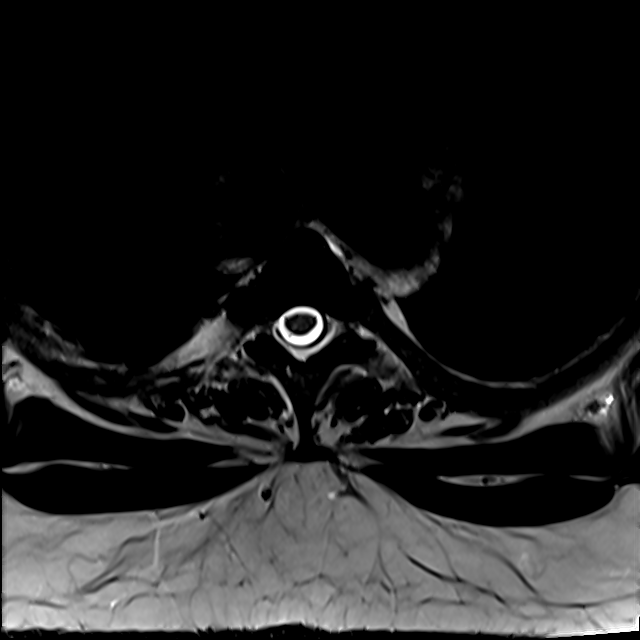
[im 46/46]
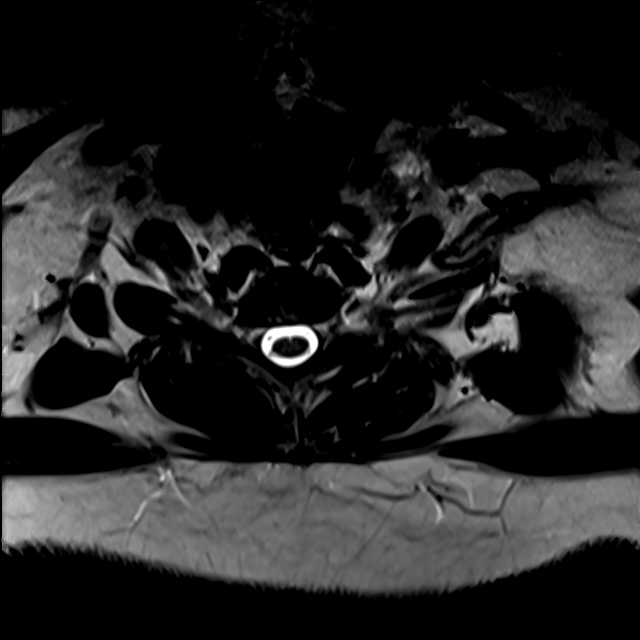

[Series 21: T1 · axial · non-contrast · 4.0mm · 0.56mm/px · z∈[-240,-35]mm · 7 of 46 slices shown (2 of 2)]
[im 1/46]
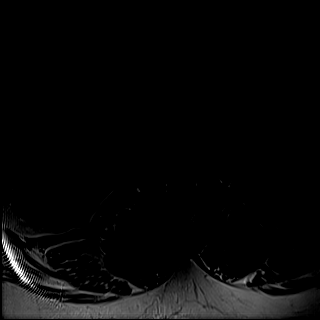
[im 8/46]
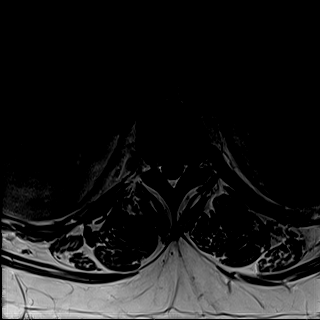
[im 16/46]
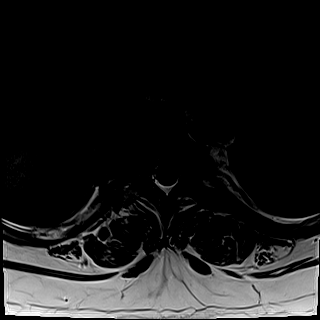
[im 23/46]
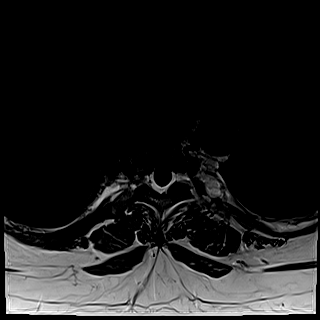
[im 31/46]
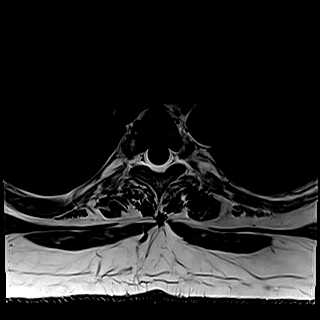
[im 38/46]
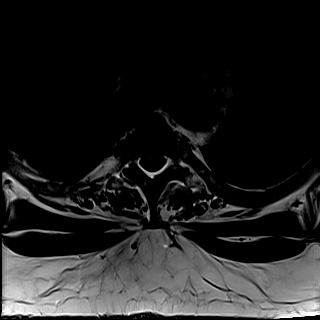
[im 46/46]
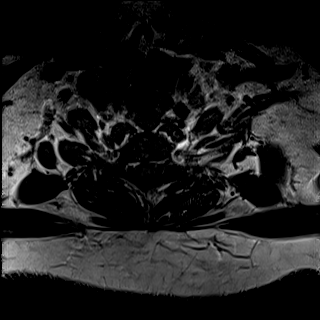

[Series 22: T2 · sagittal · 3.0mm · 0.83mm/px · 5 of 30 slices shown (2 of 2)]
[im 1/30]
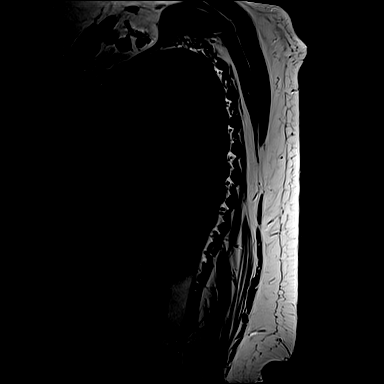
[im 8/30]
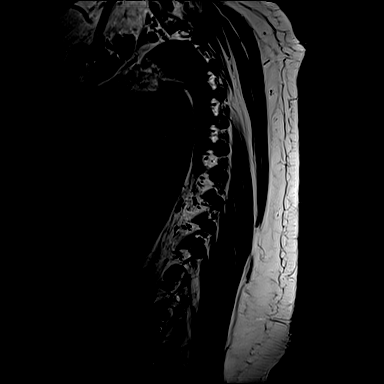
[im 15/30]
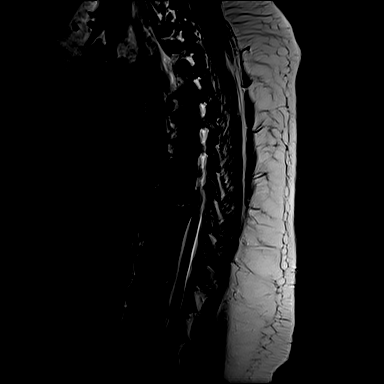
[im 22/30]
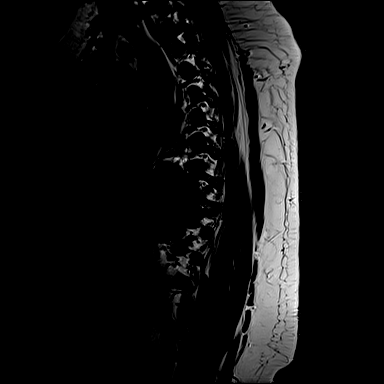
[im 30/30]
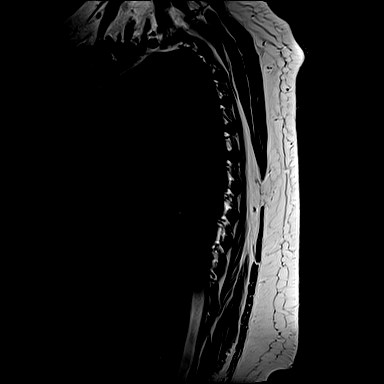

[Series 23: T1 fat-sat · sagittal · 3.0mm · 1.00mm/px · 2 of 30 slices shown]
[im 1/30]
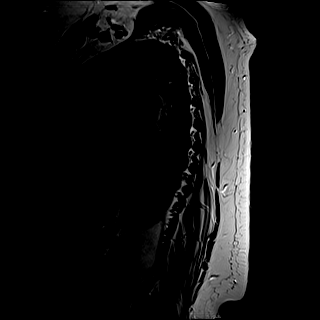
[im 8/30]
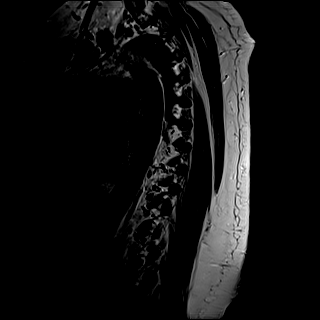

[25 of 48 positions shown; findings below may reference images not displayed]

FINDINGS: Alignment:  Maintained.

Vertebrae: As seen on the comparison exam, there is a mass lesion in
the posterior mediastinum on the right extending into the T7 and T8
vertebral bodies and into the superior aspect of the T9 vertebral
body. The lesion measures approximately 7.5 cm AP by 5.6 cm
transverse on image 27 of series 21 by 6.3 cm craniocaudal on image
10 of series 23. The lesion is T2 hyperintense, mildly hyperintense
to skeletal muscle on T1 weighted imaging and demonstrates
postcontrast enhancement. The lesion extends into the right T7 and
T8 pedicles and the left T7, T8 and T9 pedicles. There is also
involvement of the T8 and T9 facets. Epidural tumor is seen from the
inferior aspect of T7 to the superior margin of T9. Epidural tumor
is most extensive at T8 where it effaces the ventral and lateral
aspects of the thecal sac.

Cord:  Demonstrates normal signal.

Paraspinal and other soft tissues: Better seen on recent PET CT
scan.

Disc levels:

Negative for degenerative disc disease resulting in stenosis.
IMPRESSION: Large right posterior mediastinal mass invading T7, T8 and T9 as
described above. Epidural tumor is most abundant at the T8 level
where it results in a effacement of the ventral thecal sac. No cord
signal abnormality is identified. No other bony lesion is seen.

## 2020-12-08 MED ORDER — GADOBENATE DIMEGLUMINE 529 MG/ML IV SOLN
20.0000 mL | Freq: Once | INTRAVENOUS | Status: AC | PRN
  Administered 2020-12-08: 20 mL via INTRAVENOUS

## 2020-12-17 ENCOUNTER — Telehealth (INDEPENDENT_AMBULATORY_CARE_PROVIDER_SITE_OTHER): Payer: 59 | Admitting: Thoracic Surgery (Cardiothoracic Vascular Surgery)

## 2020-12-17 ENCOUNTER — Other Ambulatory Visit: Payer: Self-pay

## 2020-12-17 DIAGNOSIS — J9859 Other diseases of mediastinum, not elsewhere classified: Secondary | ICD-10-CM | POA: Diagnosis not present

## 2020-12-17 NOTE — Progress Notes (Signed)
     [address]       [place] [address]             [phone]       Patient: Home Provider: Office Consent for Telemedicine visit obtained.  Today's visit was completed via a real-time telehealth (see specific modality noted below). The patient/authorized person provided oral consent at the time of the visit to engage in a telemedicine encounter with the present provider at [facility]. The patient/authorized person was informed of the potential benefits, limitations, and risks of telemedicine. The patient/authorized person expressed understanding that the laws that protect confidentiality also apply to telemedicine. The patient/authorized person acknowledged understanding that telemedicine does not provide emergency services and that he or she would need to call 911 or proceed to the nearest hospital for help if such a need arose.  . Total time spent in the clinical discussion 10 minutes. . Telehealth Modality: Phone visit (audio only)  I had a telephone visit with Mallory Henderson and her brother.  She states that she is occasionally having some pain in her back and side.  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  I also explained that in my discussions with Dr. Rolena Infante, we both agreed that she would be best served at a [facility] for treatment of the spinal tumor.  I have made to the neurosurgical department at [facility].

## 2021-01-03 ENCOUNTER — Ambulatory Visit: Payer: 59 | Admitting: Cardiovascular Disease

## 2021-01-09 ENCOUNTER — Ambulatory Visit (HOSPITAL_BASED_OUTPATIENT_CLINIC_OR_DEPARTMENT_OTHER): Payer: 59 | Attending: Cardiovascular Disease | Admitting: Cardiovascular Disease

## 2021-04-16 ENCOUNTER — Emergency Department (HOSPITAL_COMMUNITY): Payer: 59

## 2021-04-16 ENCOUNTER — Emergency Department (HOSPITAL_COMMUNITY)
Admission: EM | Admit: 2021-04-16 | Discharge: 2021-04-16 | Disposition: A | Discharge status: Home / Self Care | Payer: 59 | Attending: Emergency Medicine | Admitting: Emergency Medicine

## 2021-04-16 ENCOUNTER — Other Ambulatory Visit: Payer: Self-pay

## 2021-04-16 ENCOUNTER — Encounter (HOSPITAL_COMMUNITY): Payer: Self-pay

## 2021-04-16 DIAGNOSIS — G939 Disorder of brain, unspecified: Secondary | ICD-10-CM

## 2021-04-16 DIAGNOSIS — M6281 Muscle weakness (generalized): Secondary | ICD-10-CM | POA: Diagnosis present

## 2021-04-16 DIAGNOSIS — R202 Paresthesia of skin: Secondary | ICD-10-CM | POA: Diagnosis not present

## 2021-04-16 DIAGNOSIS — R42 Dizziness and giddiness: Secondary | ICD-10-CM | POA: Insufficient documentation

## 2021-04-16 DIAGNOSIS — I10 Essential (primary) hypertension: Secondary | ICD-10-CM | POA: Insufficient documentation

## 2021-04-16 DIAGNOSIS — R4781 Slurred speech: Secondary | ICD-10-CM | POA: Diagnosis not present

## 2021-04-16 DIAGNOSIS — R2681 Unsteadiness on feet: Secondary | ICD-10-CM | POA: Diagnosis not present

## 2021-04-16 DIAGNOSIS — R791 Abnormal coagulation profile: Secondary | ICD-10-CM | POA: Diagnosis not present

## 2021-04-16 DIAGNOSIS — D649 Anemia, unspecified: Secondary | ICD-10-CM | POA: Diagnosis not present

## 2021-04-16 DIAGNOSIS — R7989 Other specified abnormal findings of blood chemistry: Secondary | ICD-10-CM | POA: Insufficient documentation

## 2021-04-16 DIAGNOSIS — Z79899 Other long term (current) drug therapy: Secondary | ICD-10-CM | POA: Diagnosis not present

## 2021-04-16 DIAGNOSIS — I517 Cardiomegaly: Secondary | ICD-10-CM | POA: Insufficient documentation

## 2021-04-16 DIAGNOSIS — R519 Headache, unspecified: Secondary | ICD-10-CM | POA: Insufficient documentation

## 2021-04-16 DIAGNOSIS — Z20822 Contact with and (suspected) exposure to covid-19: Secondary | ICD-10-CM | POA: Diagnosis not present

## 2021-04-16 DIAGNOSIS — R222 Localized swelling, mass and lump, trunk: Secondary | ICD-10-CM

## 2021-04-16 DIAGNOSIS — Z8616 Personal history of COVID-19: Secondary | ICD-10-CM | POA: Insufficient documentation

## 2021-04-16 DIAGNOSIS — R531 Weakness: Secondary | ICD-10-CM

## 2021-04-16 LAB — DIFFERENTIAL
Abs Immature Granulocytes: 0.02 10*3/uL (ref 0.00–0.07)
Basophils Absolute: 0 10*3/uL (ref 0.0–0.1)
Basophils Relative: 0 %
Eosinophils Absolute: 0.1 10*3/uL (ref 0.0–0.5)
Eosinophils Relative: 3 %
Immature Granulocytes: 1 %
Lymphocytes Relative: 28 %
Lymphs Abs: 1.1 10*3/uL (ref 0.7–4.0)
Monocytes Absolute: 0.5 10*3/uL (ref 0.1–1.0)
Monocytes Relative: 11 %
Neutro Abs: 2.3 10*3/uL (ref 1.7–7.7)
Neutrophils Relative %: 57 %

## 2021-04-16 LAB — CBC
HCT: 35.1 % — ABNORMAL LOW (ref 36.0–46.0)
Hemoglobin: 11.7 g/dL — ABNORMAL LOW (ref 12.0–15.0)
MCH: 28.9 pg (ref 26.0–34.0)
MCHC: 33.3 g/dL (ref 30.0–36.0)
MCV: 86.7 fL (ref 80.0–100.0)
Platelets: 167 10*3/uL (ref 150–400)
RBC: 4.05 MIL/uL (ref 3.87–5.11)
RDW: 13.4 % (ref 11.5–15.5)
WBC: 4 10*3/uL (ref 4.0–10.5)
nRBC: 0 % (ref 0.0–0.2)

## 2021-04-16 LAB — I-STAT CHEM 8, ED
BUN: 9 mg/dL (ref 6–20)
Calcium, Ion: 1.2 mmol/L (ref 1.15–1.40)
Chloride: 101 mmol/L (ref 98–111)
Creatinine, Ser: 1.1 mg/dL — ABNORMAL HIGH (ref 0.44–1.00)
Glucose, Bld: 120 mg/dL — ABNORMAL HIGH (ref 70–99)
HCT: 36 % (ref 36.0–46.0)
Hemoglobin: 12.2 g/dL (ref 12.0–15.0)
Potassium: 3.7 mmol/L (ref 3.5–5.1)
Sodium: 139 mmol/L (ref 135–145)
TCO2: 26 mmol/L (ref 22–32)

## 2021-04-16 LAB — ETHANOL: Alcohol, Ethyl (B): <10 mg/dL (ref <10)

## 2021-04-16 LAB — RESP PANEL BY RT-PCR (FLU A&B, COVID) ARPGX2
Influenza A by PCR: NEGATIVE
Influenza B by PCR: NEGATIVE
SARS Coronavirus 2 by RT PCR: NEGATIVE

## 2021-04-16 LAB — COMPREHENSIVE METABOLIC PANEL
ALT: 32 U/L (ref 0–44)
AST: 30 U/L (ref 15–41)
Albumin: 3.8 g/dL (ref 3.5–5.0)
Alkaline Phosphatase: 56 U/L (ref 38–126)
Anion gap: 9 (ref 5–15)
BUN: 9 mg/dL (ref 6–20)
CO2: 24 mmol/L (ref 22–32)
Calcium: 9.5 mg/dL (ref 8.9–10.3)
Chloride: 103 mmol/L (ref 98–111)
Creatinine, Ser: 1.16 mg/dL — ABNORMAL HIGH (ref 0.44–1.00)
GFR, Estimated: 56 mL/min — ABNORMAL LOW (ref >60)
Glucose, Bld: 122 mg/dL — ABNORMAL HIGH (ref 70–99)
Potassium: 3.8 mmol/L (ref 3.5–5.1)
Sodium: 136 mmol/L (ref 135–145)
Total Bilirubin: 1 mg/dL (ref 0.3–1.2)
Total Protein: 7.5 g/dL (ref 6.5–8.1)

## 2021-04-16 LAB — URINALYSIS, ROUTINE W REFLEX MICROSCOPIC
Bilirubin Urine: NEGATIVE
Glucose, UA: NEGATIVE mg/dL
Hgb urine dipstick: NEGATIVE
Ketones, ur: NEGATIVE mg/dL
Leukocytes,Ua: NEGATIVE
Nitrite: NEGATIVE
Protein, ur: NEGATIVE mg/dL
Specific Gravity, Urine: 1.025 (ref 1.005–1.030)
pH: 5.5 (ref 5.0–8.0)

## 2021-04-16 LAB — TROPONIN I (HIGH SENSITIVITY)
Troponin I (High Sensitivity): 5 ng/L (ref <18)
Troponin I (High Sensitivity): 5 ng/L (ref <18)

## 2021-04-16 LAB — RAPID URINE DRUG SCREEN, HOSP PERFORMED
Amphetamines: NOT DETECTED
Barbiturates: NOT DETECTED
Benzodiazepines: NOT DETECTED
Cocaine: NOT DETECTED
Opiates: NOT DETECTED
Tetrahydrocannabinol: NOT DETECTED

## 2021-04-16 LAB — I-STAT BETA HCG BLOOD, ED (MC, WL, AP ONLY): I-stat hCG, quantitative: <5 m[IU]/mL (ref <5)

## 2021-04-16 LAB — CBG MONITORING, ED: Glucose-Capillary: 127 mg/dL — ABNORMAL HIGH (ref 70–99)

## 2021-04-16 LAB — PROTIME-INR
INR: 1.1 (ref 0.8–1.2)
Prothrombin Time: 13.7 seconds (ref 11.4–15.2)

## 2021-04-16 LAB — APTT: aPTT: 28 seconds (ref 24–36)

## 2021-04-16 IMAGING — DX DG CHEST 1V PORT
1 series · 1 of 1 positions shown · non-contrast
Comparison: [DATE]

CLINICAL DATA: Chest pain.  Slurred speech.

EXAM:
PORTABLE CHEST 1 VIEW

[chest]
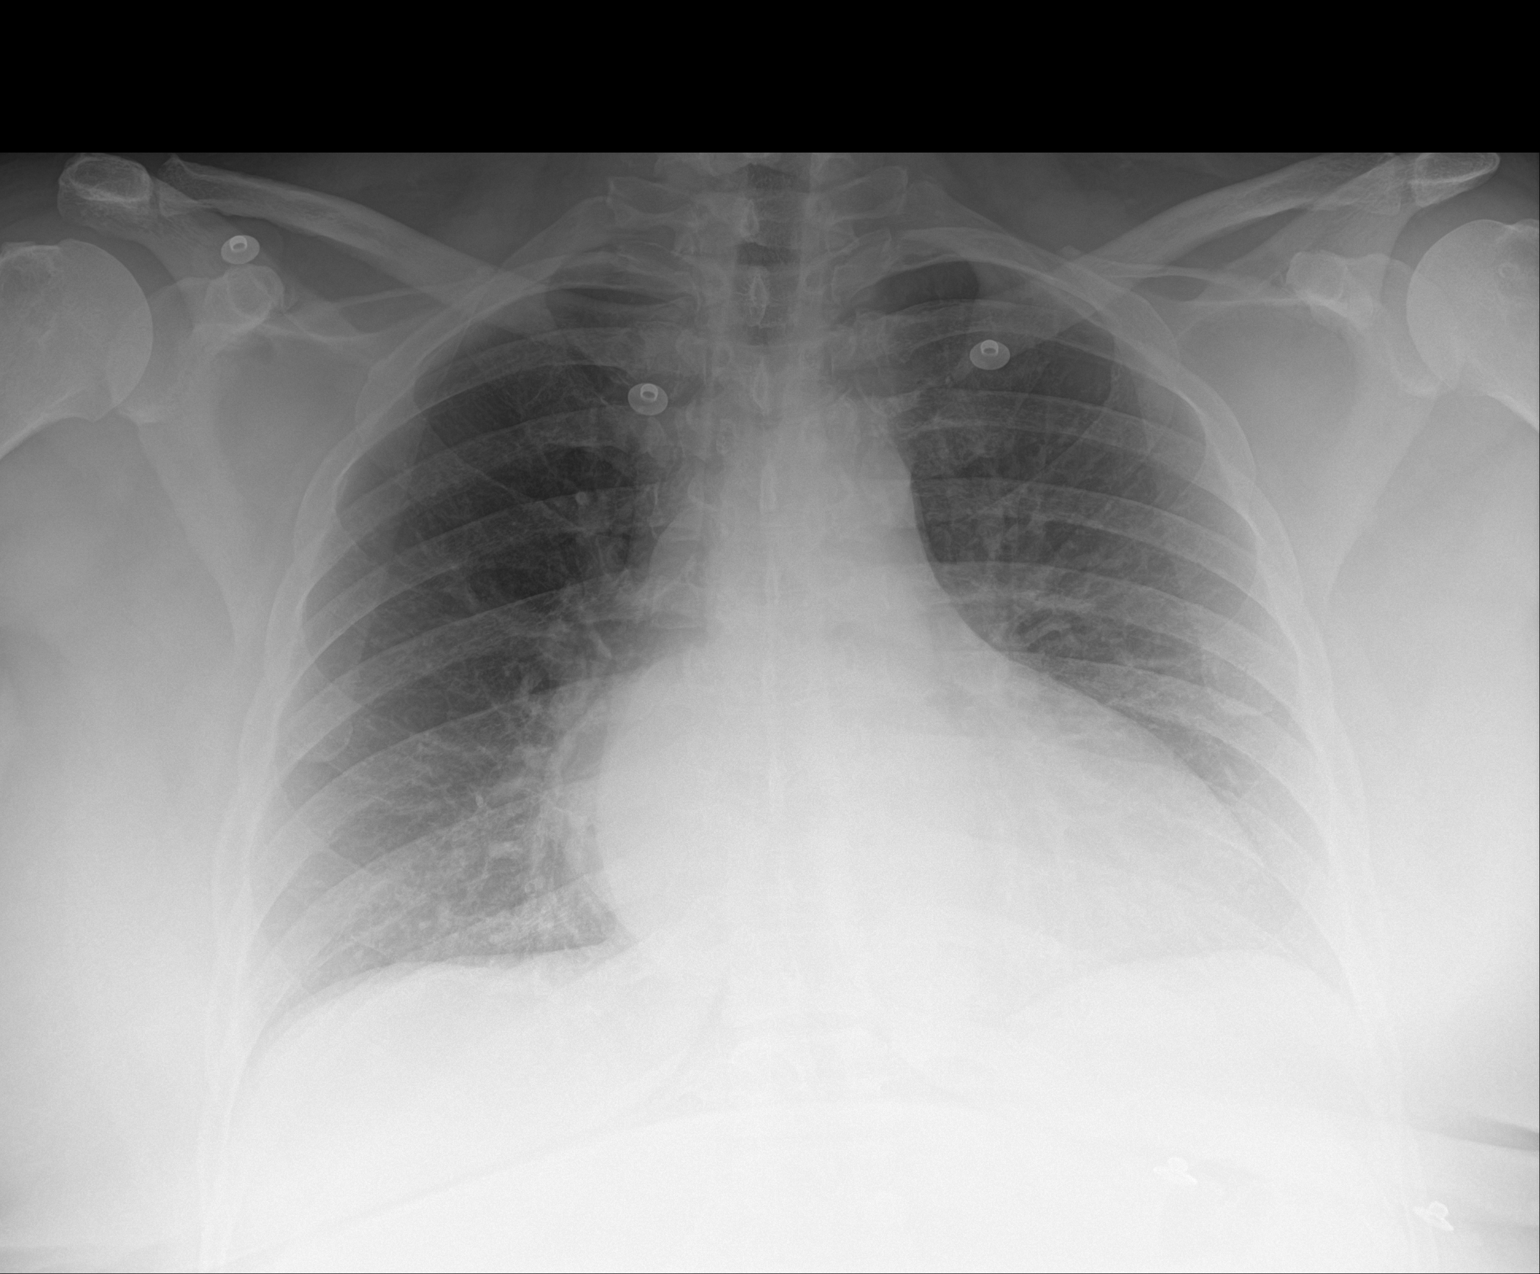

[1 of 1 positions shown; findings below may reference images not displayed]

FINDINGS: Cardiac enlargement. No vascular congestion, edema, or
consolidation. No pleural effusions. No pneumothorax. Mediastinal
contours appear intact.
IMPRESSION: Cardiac enlargement.  No active pulmonary disease.

## 2021-04-16 IMAGING — MR MR HEAD WO/W CM
14 of 16 series · 40 of 48 positions shown · IV contrast (Gadavist)
Comparison: Head CT from earlier today

CLINICAL DATA: Weakness and speech problems

EXAM:
MRI HEAD WITHOUT AND WITH CONTRAST
TECHNIQUE: Multiplanar, multiecho pulse sequences of the brain and surrounding
structures were obtained without and with intravenous contrast.
CONTRAST:  10mL GADAVIST GADOBUTROL 1 MMOL/ML IV SOLN

[Series 5: DWI · axial · 3.0mm · 0.88mm/px · z∈[-139,+0]mm · 5 of 96 slices shown (1 of 4)]
[im 1/96]
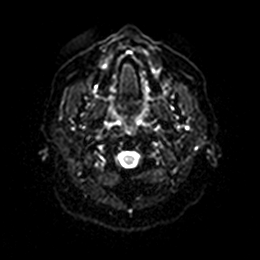
[im 24/96]
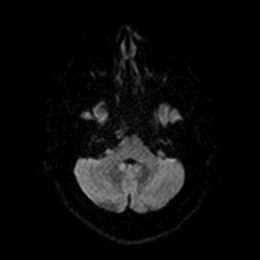
[im 48/96]
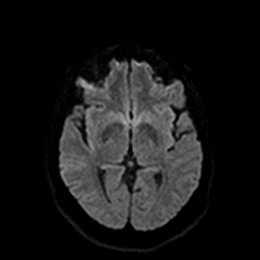
[im 72/96]
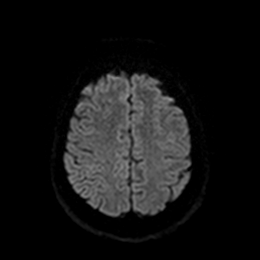
[im 96/96]
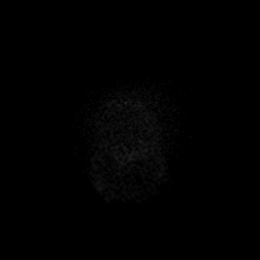

[Series 6: DWI · axial · 3.0mm · 0.88mm/px · z∈[-139,+0]mm · 2 of 48 slices shown (2 of 4)]
[im 1/48]
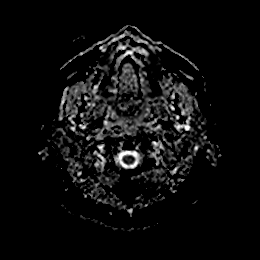
[im 48/48]
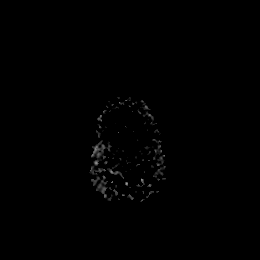

[Series 7: DWI · coronal · 4.0mm · 0.88mm/px · 4 of 68 slices shown (3 of 4)]
[im 1/68]
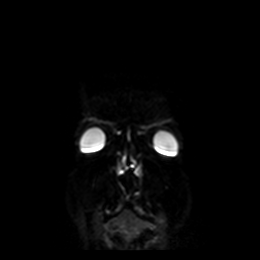
[im 23/68]
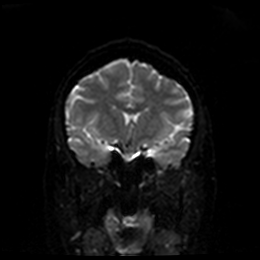
[im 45/68]
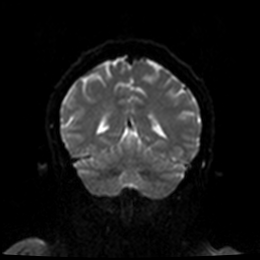
[im 68/68]
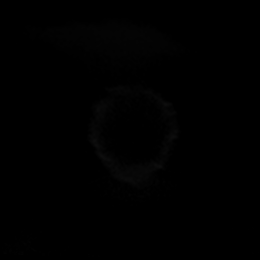

[Series 8: DWI · coronal · 4.0mm · 0.88mm/px · 2 of 34 slices shown (4 of 4)]
[im 1/34]
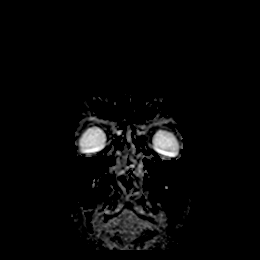
[im 34/34]
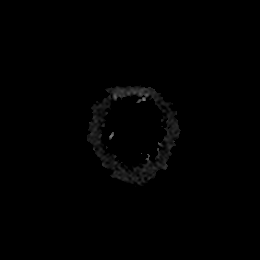

[Series 9: T1 · sagittal · 5.0mm · 0.75mm/px · 2 of 23 slices shown]
[im 1/23]
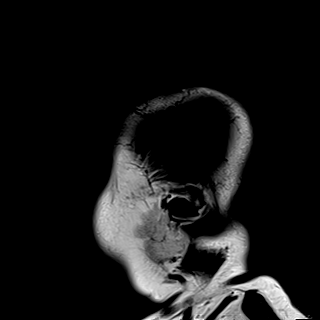
[im 23/23]
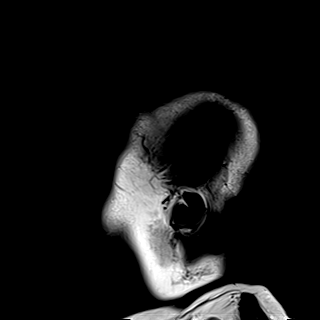

[Series 10: T2 · axial · 5.0mm · 0.72mm/px · z∈[-140,+2]mm · 2 of 25 slices shown]
[im 1/25]
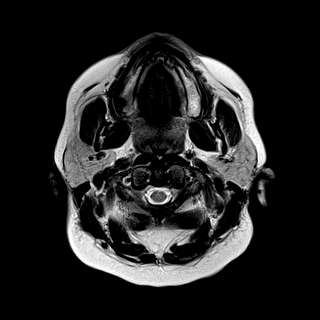
[im 25/25]
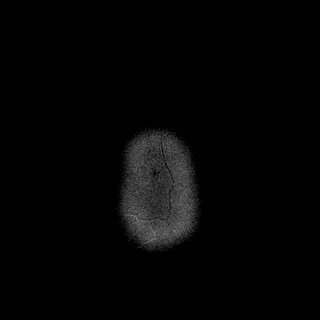

[Series 11: FLAIR · axial · 5.0mm · 0.45mm/px · z∈[-139,+2]mm · 2 of 25 slices shown]
[im 1/25]
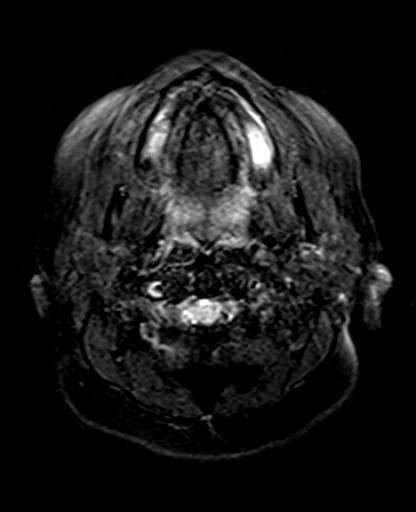
[im 25/25]
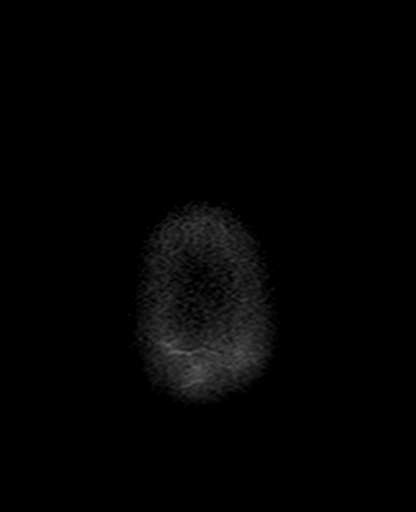

[Series 12: mag_images · axial · 3.0mm · 0.90mm/px · z∈[-150,+13]mm · 4 of 56 slices shown]
[im 1/56]
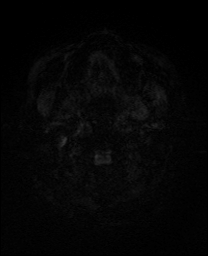
[im 19/56]
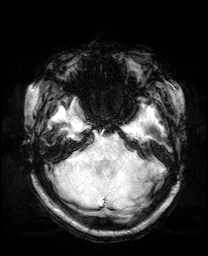
[im 37/56]
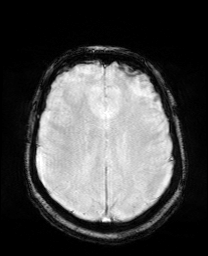
[im 56/56]
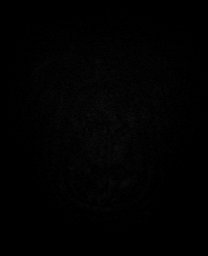

[Series 13: pha_images · axial · 3.0mm · 0.90mm/px · z∈[-150,+13]mm · 4 of 56 slices shown]
[im 1/56]
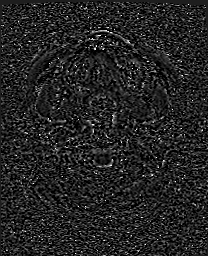
[im 19/56]
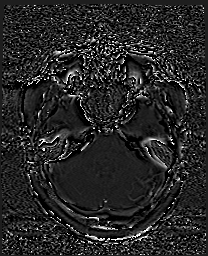
[im 37/56]
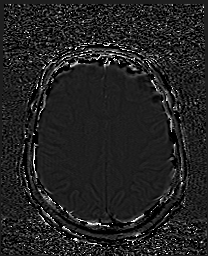
[im 56/56]
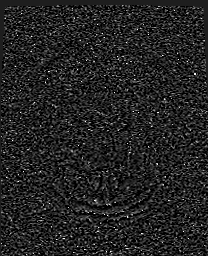

[Series 14: swi_images · axial · 3.0mm · 0.90mm/px · z∈[-150,+13]mm · 4 of 56 slices shown]
[im 1/56]
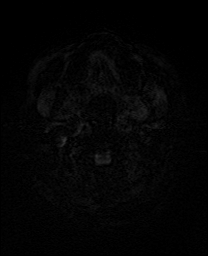
[im 19/56]
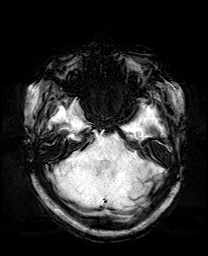
[im 37/56]
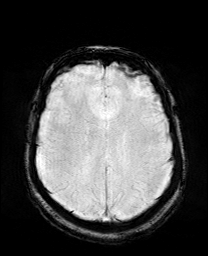
[im 56/56]
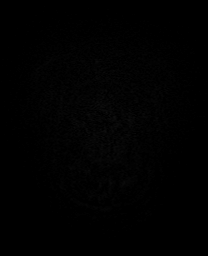

[Series 15: mip_images(sw) · axial · 24.0mm · 0.90mm/px · z∈[-139,+2]mm · 3 of 49 slices shown]
[im 1/49]
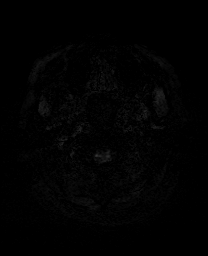
[im 25/49]
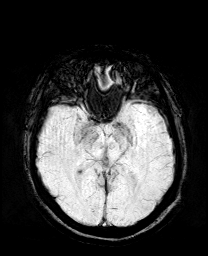
[im 49/49]
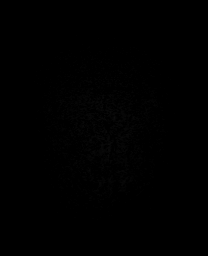

[Series 17: T2 post-contrast · coronal · 5.0mm · 0.72mm/px · 2 of 28 slices shown]
[im 1/28]
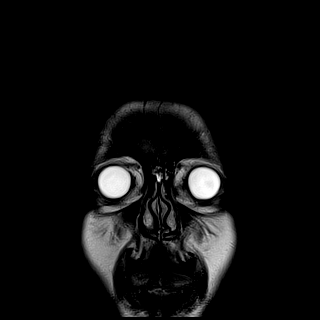
[im 28/28]
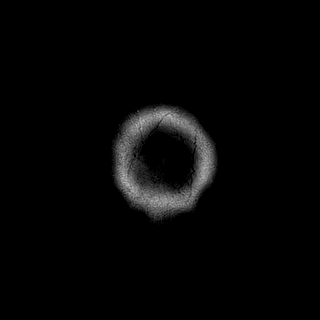

[Series 19: T1 post-contrast · coronal · 5.0mm · 0.34mm/px · 2 of 28 slices shown (1 of 2)]
[im 1/28]
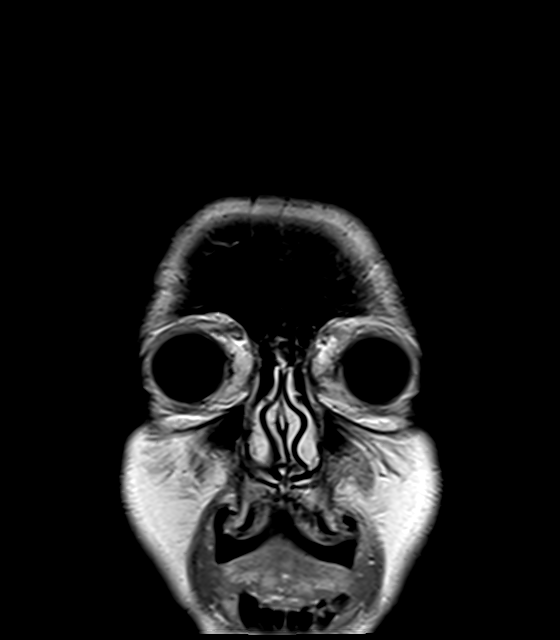
[im 28/28]
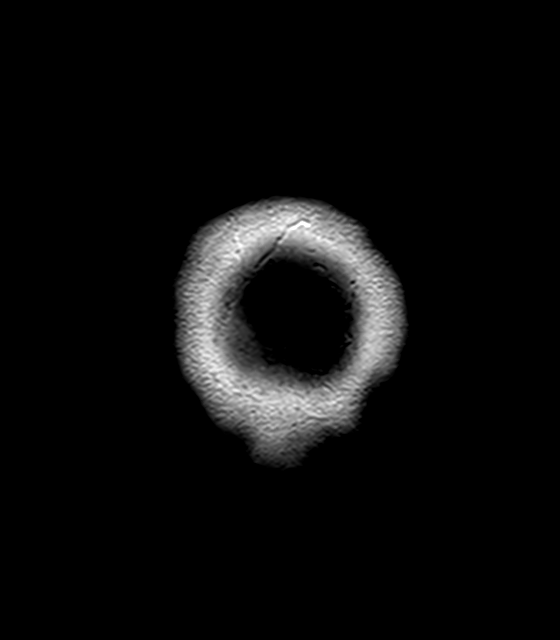

[Series 20: T1 post-contrast · sagittal · 5.0mm · 0.72mm/px · 2 of 23 slices shown (2 of 2)]
[im 1/23]
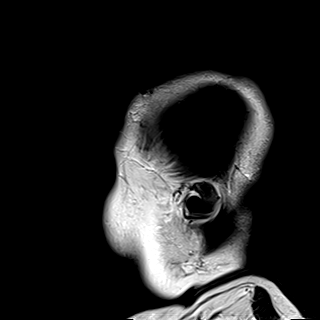
[im 23/23]
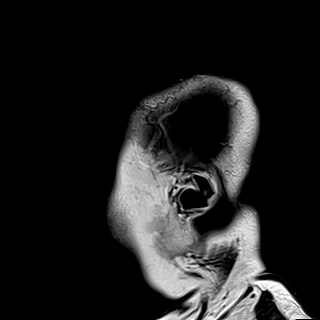

[40 of 48 positions shown; findings below may reference images not displayed]

FINDINGS: Brain: Right retro clival/petrous apex dural-based mass measuring 19
x 8 mm on axial slices, with infiltration into the upper right
Meckel's cave. Most commonly this would reflect a meningioma, but
noted history of plasmacytoma/myeloma. No hypermetabolism was seen
in this area on PET-CT [DATE], although it is impossible to
detect if the mass was present. There is mild scalloping of the
upper right clivus at the level of mass, which was present on the
prior noncontrast CT.

No acute infarct, hemorrhage, hydrocephalus, or collection. Brain
volume is normal

Vascular: Normal flow voids and vascular enhancements

Skull and upper cervical spine: Normal marrow signal

Sinuses/Orbits: Negative
IMPRESSION: 1. No acute finding such as infarct.
2. 19 x 8 mm dural based mass along the right clivus and petrous
ridge with infiltration into the upper Meckel's cave. Most commonly
this would reflect meningioma but recommend close follow-up to
exclude extra medullary myeloid tumor given patient's active
malignancy.

## 2021-04-16 IMAGING — CT CT HEAD CODE STROKE
2 series · 15 of 37 positions shown, 18 images · non-contrast
Comparison: None.

CLINICAL DATA: Code stroke.  Slurred speech

EXAM:
CT HEAD WITHOUT CONTRAST
TECHNIQUE: Contiguous axial images were obtained from the base of the skull
through the vertex without intravenous contrast.

[Series 3: head 5.0 st · axial · 0.39mm/px · z∈[-169,-39]mm · 12 of 32 slices shown, 15 images]
[im 3/32  brain]
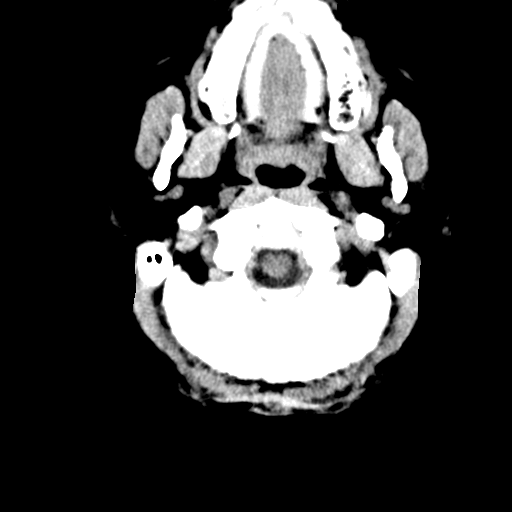
[im 3/32  bone]
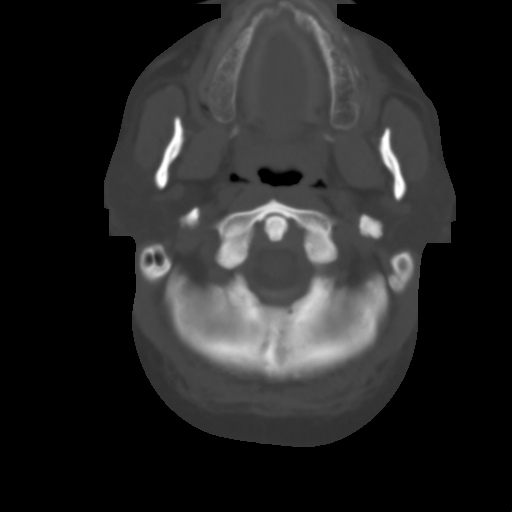
[im 5/32  brain]
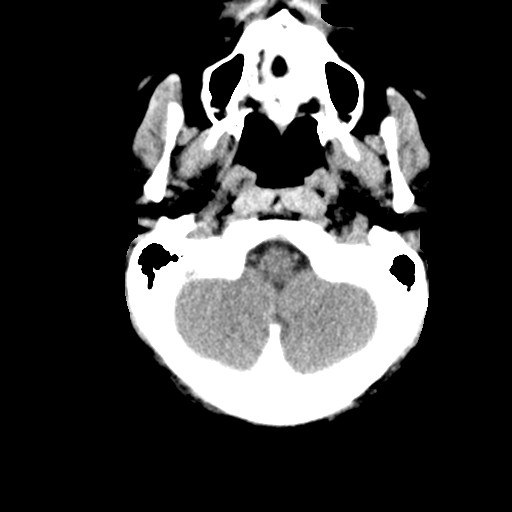
[im 7/32  brain]
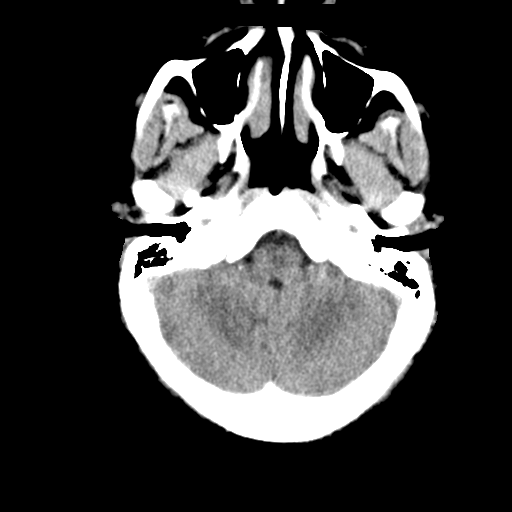
[im 10/32  brain]
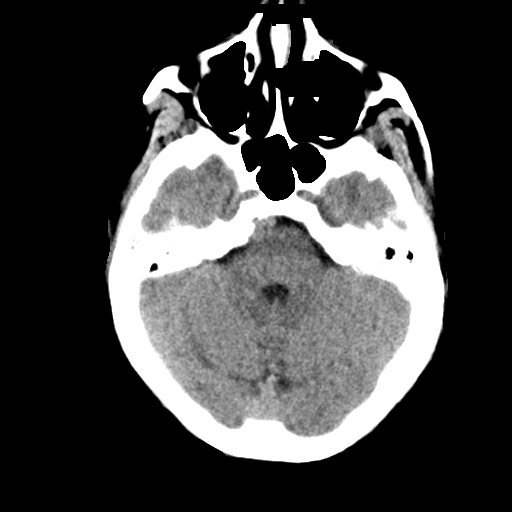
[im 12/32  brain]
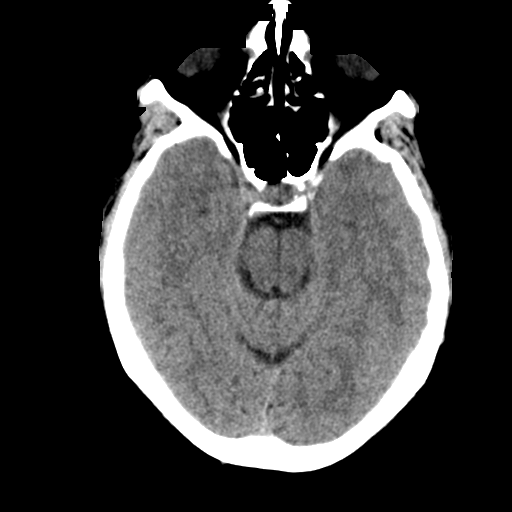
[im 12/32  bone]
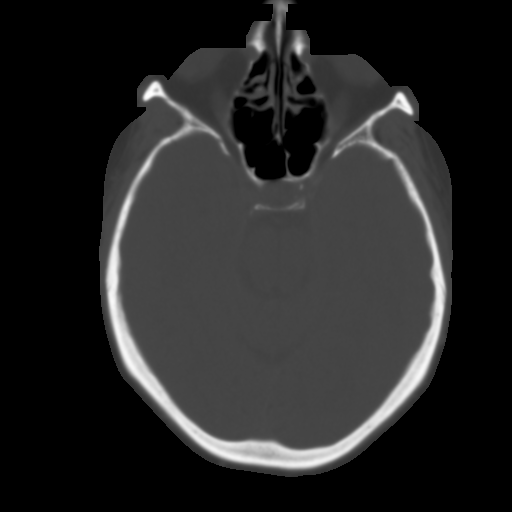
[im 14/32  brain]
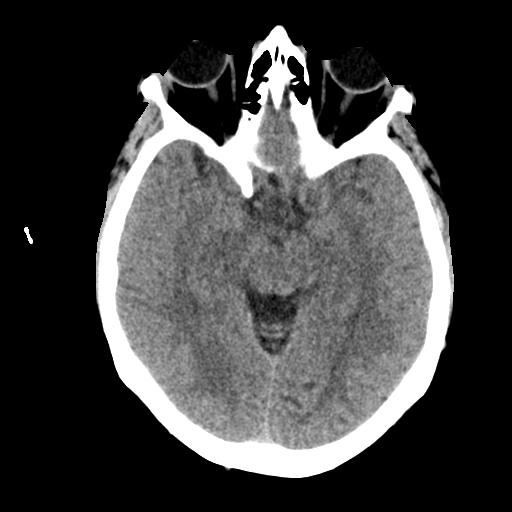
[im 18/32  brain]
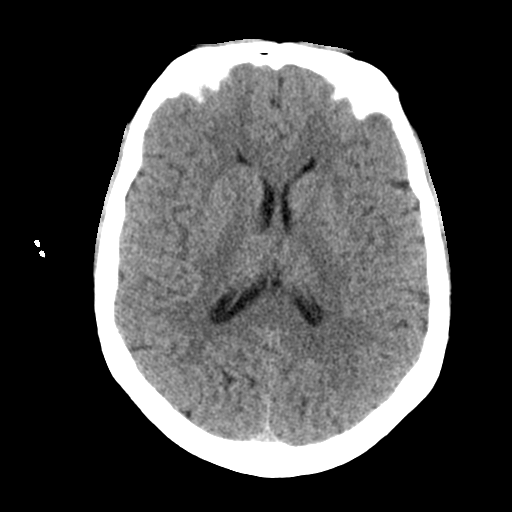
[im 20/32  brain]
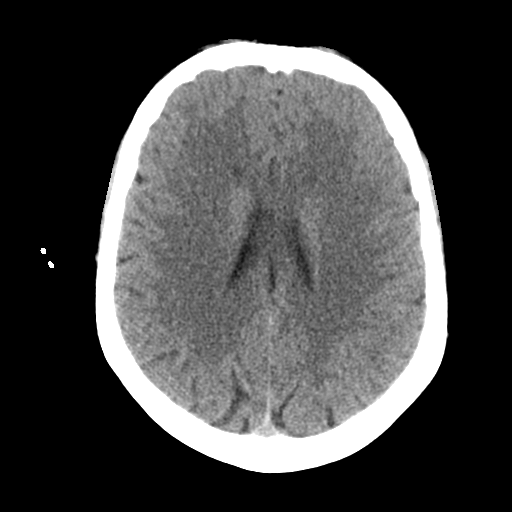
[im 22/32  brain]
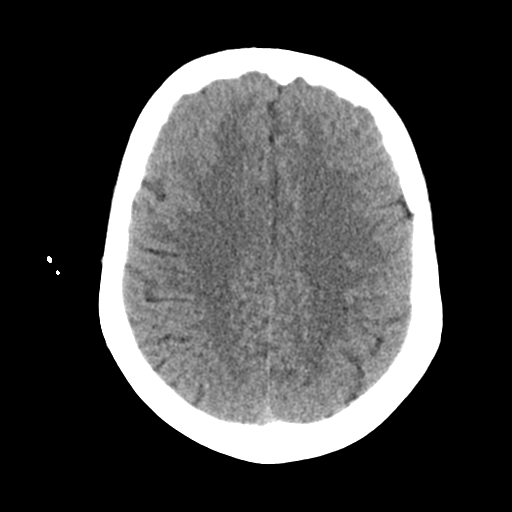
[im 22/32  bone]
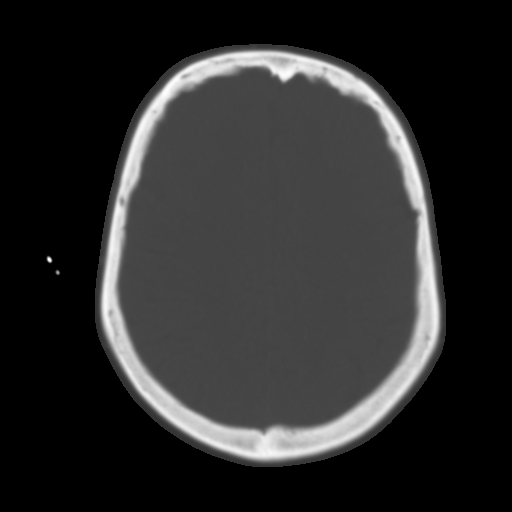
[im 25/32  brain]
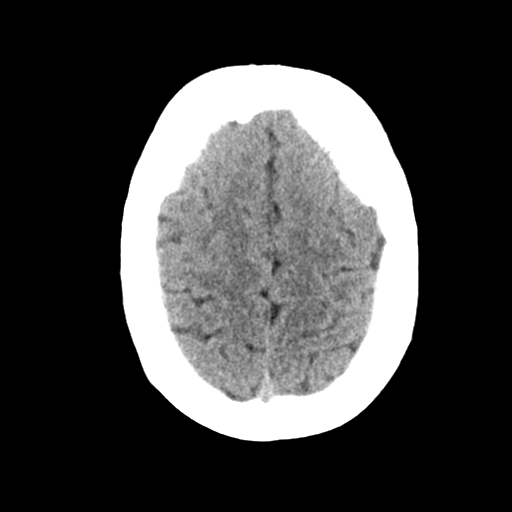
[im 27/32  brain]
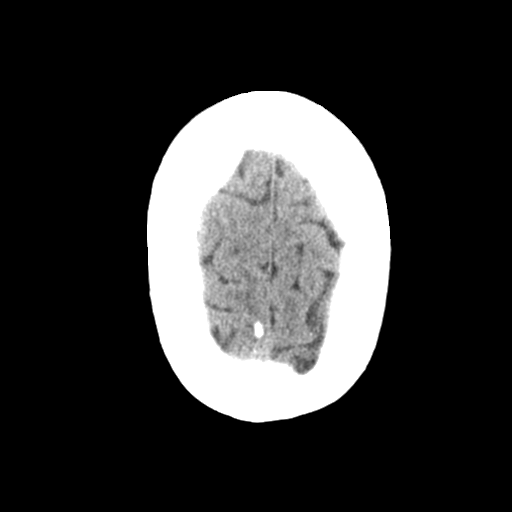
[im 29/32  brain]
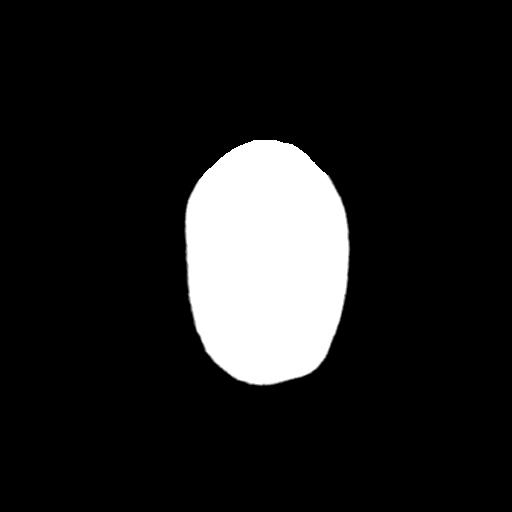

[Series 6: head 3.0 sag st · sagittal · 0.31mm/px · 3 of 67 slices shown]
[im 23/67  brain]
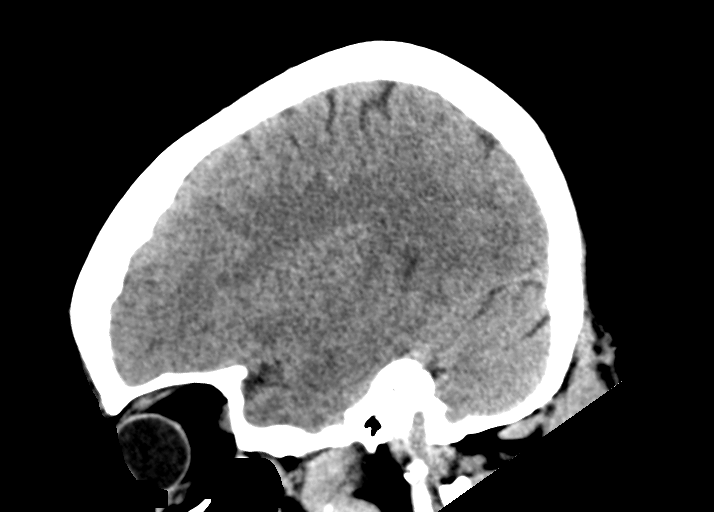
[im 34/67  brain]
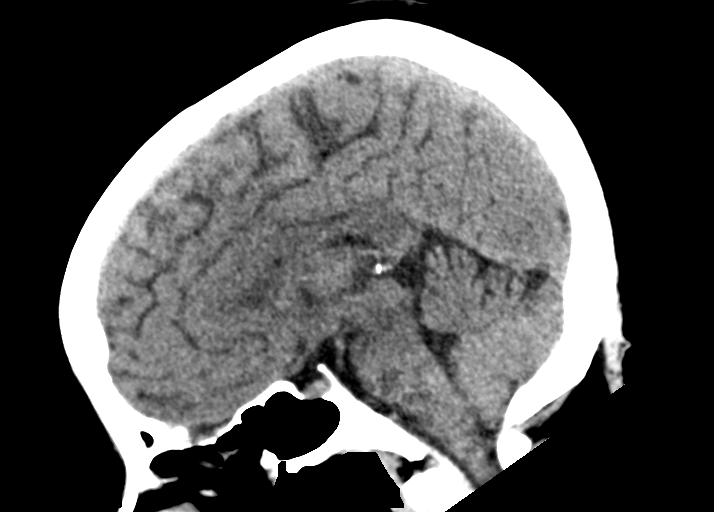
[im 45/67  brain]
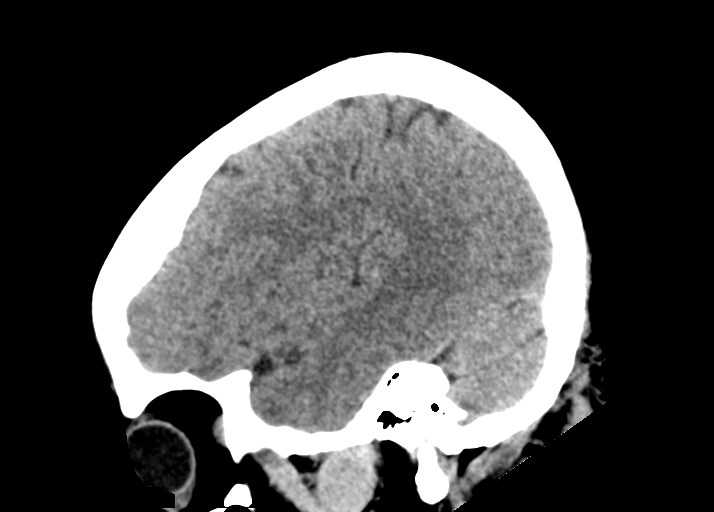

[15 of 37 positions shown; findings below may reference images not displayed]

FINDINGS: Brain: There is no mass, hemorrhage or extra-axial collection. The
size and configuration of the ventricles and extra-axial CSF spaces
are normal. The brain parenchyma is normal, without evidence of
acute or chronic infarction.

Vascular: No abnormal hyperdensity of the major intracranial
arteries or dural venous sinuses. No intracranial atherosclerosis.

Skull: The visualized skull base, calvarium and extracranial soft
tissues are normal.

Sinuses/Orbits: No fluid levels or advanced mucosal thickening of
the visualized paranasal sinuses. No mastoid or middle ear effusion.
The orbits are normal.

ASPECTS (Alberta Stroke Program Early CT Score)

- Ganglionic level infarction (caudate, lentiform nuclei, internal
capsule, insula, M1-M3 cortex): 7

- Supraganglionic infarction (M4-M6 cortex): 3

Total score (0-10 with 10 being normal): 10
IMPRESSION: 1. Normal head CT.
2. ASPECTS is 10.

These results were called by telephone at the time of interpretation
on [DATE] at [DATE] to provider BANKS , who
verbally acknowledged these results.

## 2021-04-16 IMAGING — MR MR THORACIC SPINE WO/W CM
6 of 10 series · 23 of 48 positions shown · IV contrast (gadavist)
Comparison: [DATE]

CLINICAL DATA: Spinal stenosis.  CSF leak suspected.

Per chart, concern for cord compression. Plasmacytoma or multiple
myeloma.
EXAM:
MRI CERVICAL AND THORACIC SPINE WITHOUT AND WITH CONTRAST
TECHNIQUE: Multiplanar and multiecho pulse sequences of the cervical spine, to
include the craniocervical junction and cervicothoracic junction,
and the thoracic spine, were obtained without and with intravenous
contrast.
CONTRAST:  10mL GADAVIST GADOBUTROL 1 MMOL/ML IV SOLN

[Series 33: T1 · sagittal · 3.3mm · 0.62mm/px · 1 of 8 slices shown (1 of 3)]
[im 1/8]
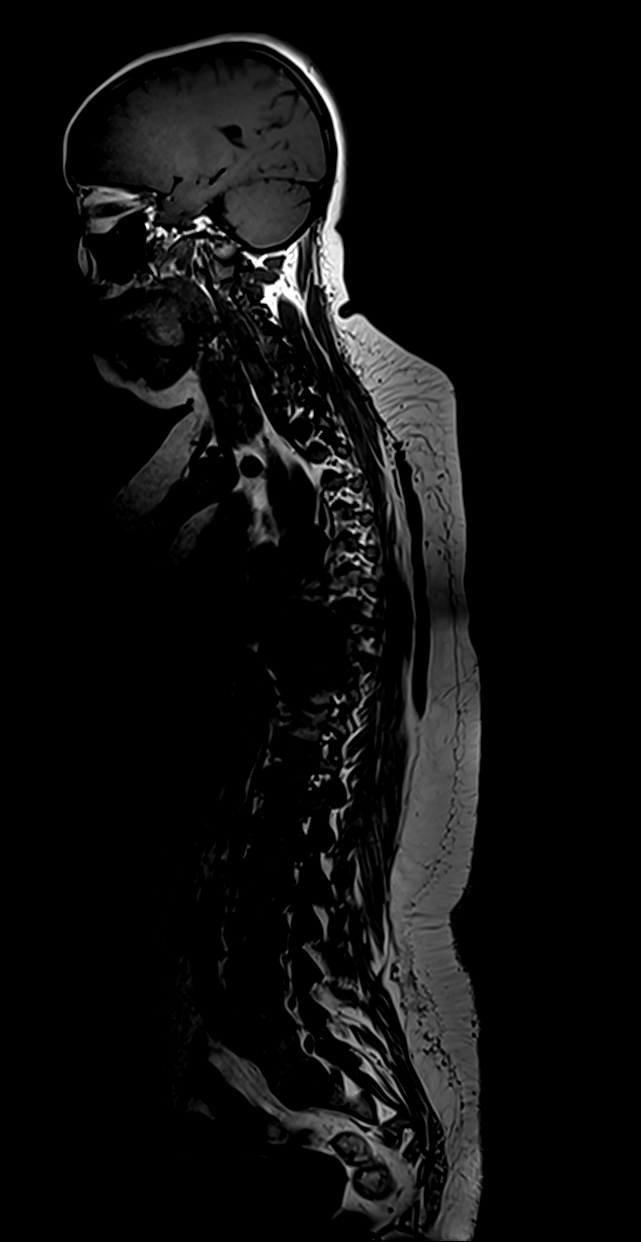

[Series 34: T2 · sagittal · 3.0mm · 0.76mm/px · 4 of 21 slices shown (1 of 3)]
[im 1/21]
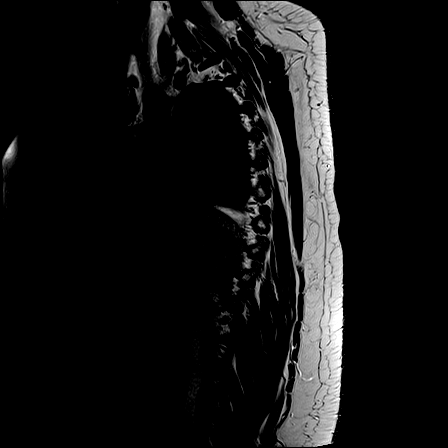
[im 7/21]
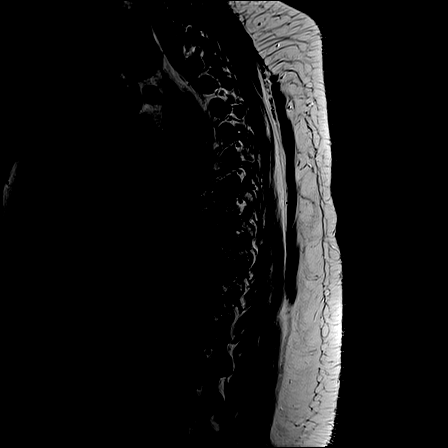
[im 14/21]
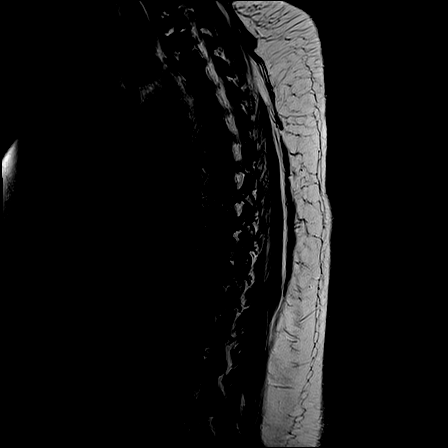
[im 21/21]
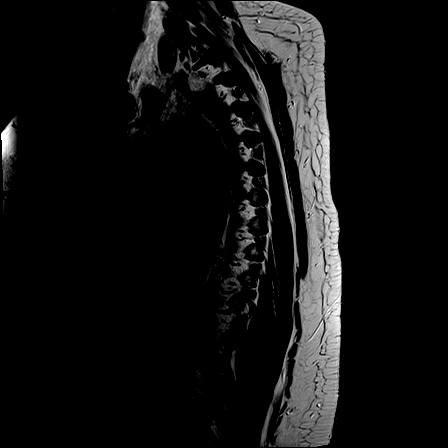

[Series 35: T1 · sagittal · 3.0mm · 0.76mm/px · 4 of 21 slices shown (2 of 3)]
[im 1/21]
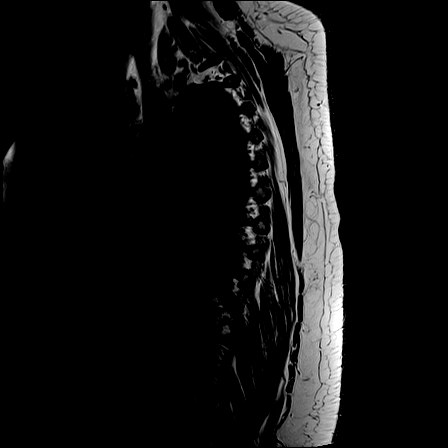
[im 7/21]
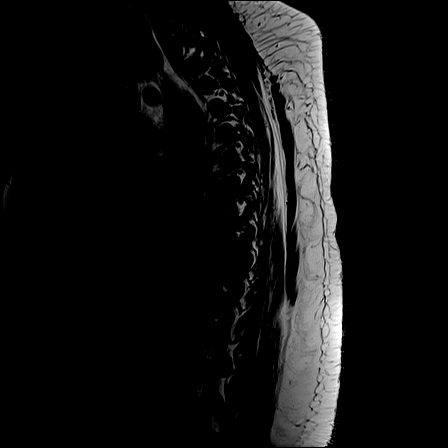
[im 14/21]
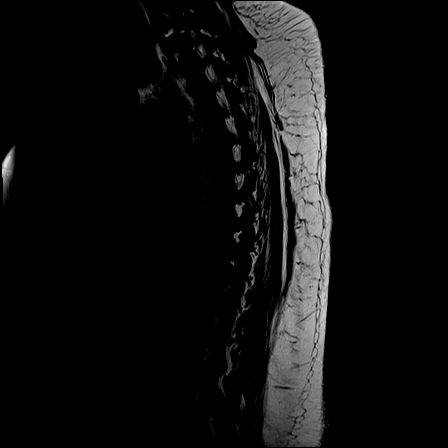
[im 21/21]
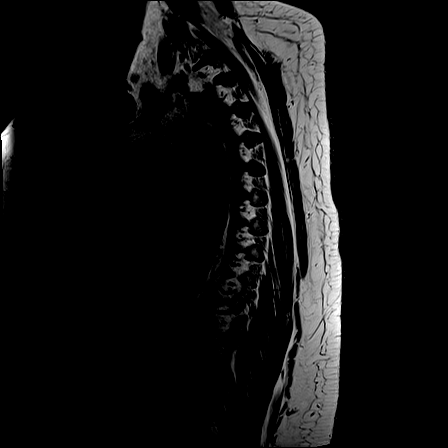

[Series 37: T2 · sagittal · 3.0mm · 0.76mm/px · 4 of 21 slices shown (2 of 3)]
[im 1/21]
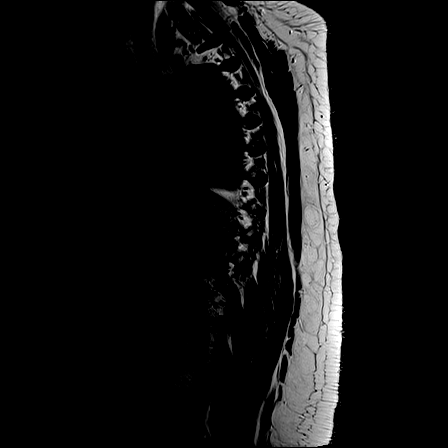
[im 7/21]
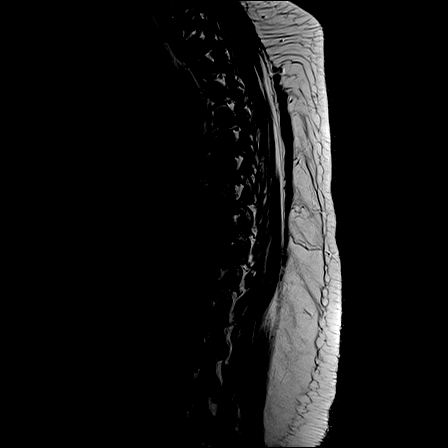
[im 14/21]
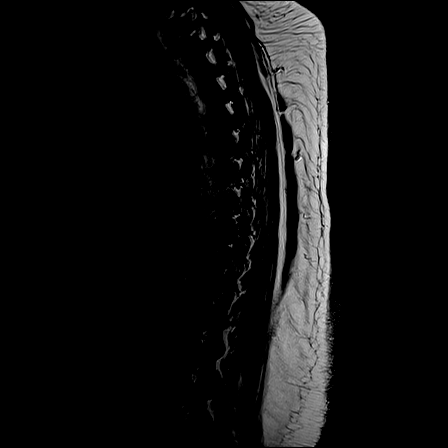
[im 21/21]
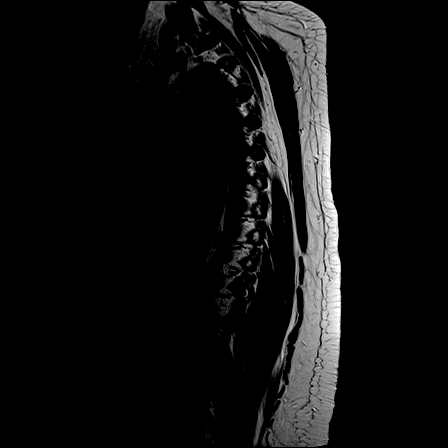

[Series 38: T2 · axial · 5.0mm · 0.59mm/px · z∈[-438,-210]mm · 7 of 41 slices shown (3 of 3)]
[im 1/41]
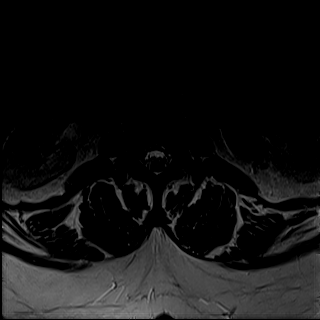
[im 7/41]
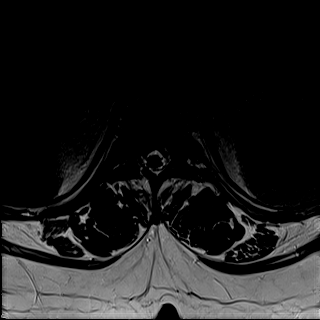
[im 14/41]
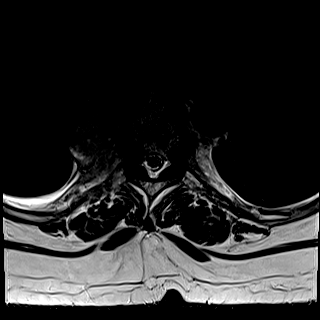
[im 21/41]
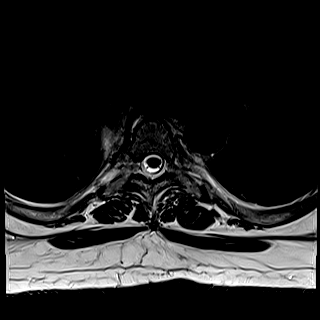
[im 27/41]
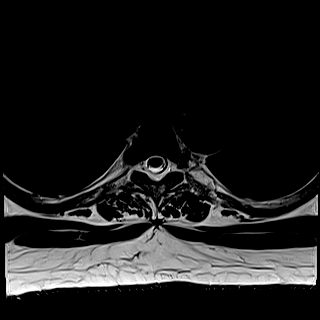
[im 34/41]
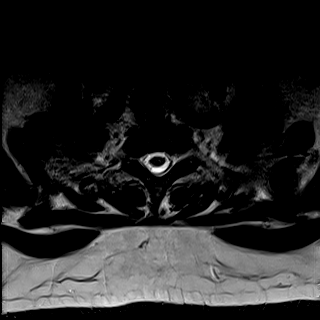
[im 41/41]
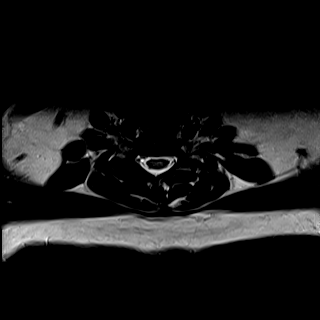

[Series 40: T1 · axial · non-contrast · 5.0mm · 0.31mm/px · z∈[-438,-338]mm · 3 of 41 slices shown (3 of 3)]
[im 1/41]
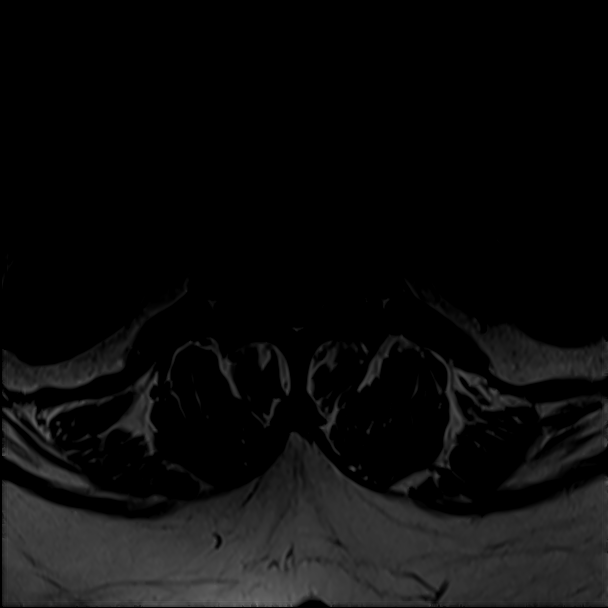
[im 7/41]
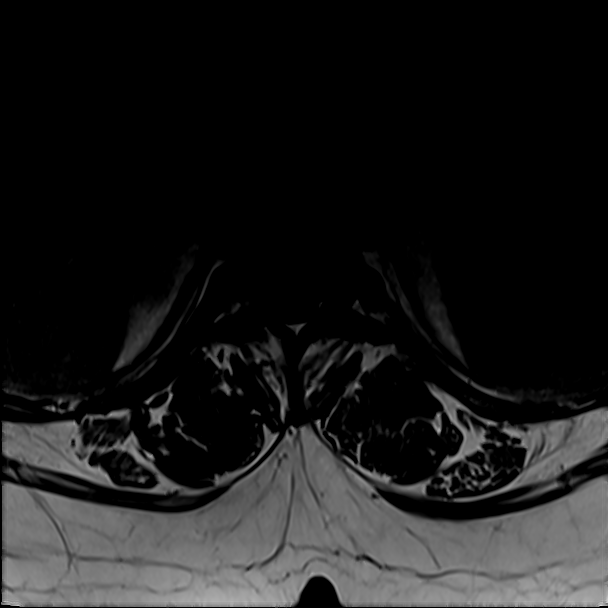
[im 14/41]
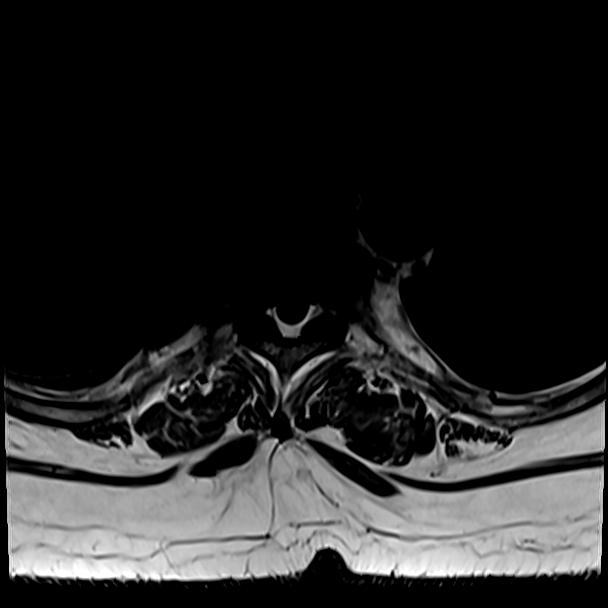

[23 of 48 positions shown; findings below may reference images not displayed]

FINDINGS: MRI CERVICAL SPINE FINDINGS

Alignment: Negative for listhesis.  Reversal of cervical lordosis.

Vertebrae: No fracture, evidence of discitis, or bone lesion.

Cord: Normal signal and morphology.

Posterior Fossa, vertebral arteries, paraspinal tissues: Posterior
fossa described on dedicated study. No perispinal mass or
inflammation.

Disc levels:

C2-3: Unremarkable.

C3-4: Mild disc bulging

C4-5: Mild disc bulging

C5-6: Mild disc bulging with ventral spurring

C6-7: Mild disc bulging and ventral spurring

C7-T1:Unremarkable.

MRI THORACIC SPINE FINDINGS

Alignment:  Stable

Vertebrae: Large primarily osseous mass spanning the T7-T9 vertebral
bodies with essentially complete replacement of T7 and T8 bodies,
growing ventrally through the cortex into the paravertebral space.
On axial images the mass measures up to 76 x 16 mm and craniocaudal
it measures up to 70 mm. No decrease from before, rather dimensions
measure a few mm larger. Posterior cortex loss at the level of T7
and T8 without cord mass effect. Right foraminal impingement at
T8-9, chronic. New fatty signal at the adjacent marrow suggesting
interval radiotherapy.

Cord:  No cord compression or edema.

Paraspinal and other soft tissues: As above

Disc levels:

No significant degenerative changes.
IMPRESSION: 1. Negative for cord compression or signal abnormality.
2. Stable or slightly increased midthoracic and paravertebral mass
as described. Chronic right foraminal impingement at T8-9.

## 2021-04-16 IMAGING — MR MR CERVICAL SPINE WO/W CM
6 of 8 series · 30 of 48 positions shown · IV contrast (gadavist)
Comparison: [DATE]

CLINICAL DATA: Spinal stenosis.  CSF leak suspected.

Per chart, concern for cord compression. Plasmacytoma or multiple
myeloma.
EXAM:
MRI CERVICAL AND THORACIC SPINE WITHOUT AND WITH CONTRAST
TECHNIQUE: Multiplanar and multiecho pulse sequences of the cervical spine, to
include the craniocervical junction and cervicothoracic junction,
and the thoracic spine, were obtained without and with intravenous
contrast.
CONTRAST:  10mL GADAVIST GADOBUTROL 1 MMOL/ML IV SOLN

[Series 31: T2 · sagittal · 3.0mm · 0.69mm/px · 3 of 15 slices shown (1 of 2)]
[im 1/15]
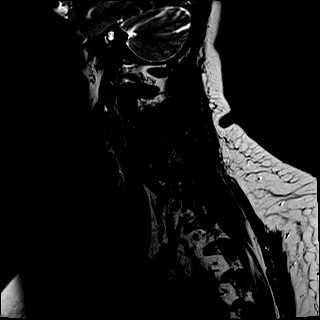
[im 8/15]
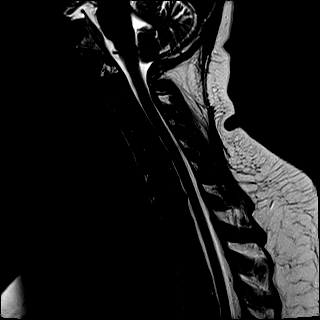
[im 15/15]
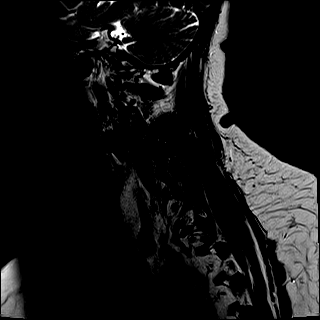

[Series 32: T1 · sagittal · 3.0mm · 0.69mm/px · 3 of 15 slices shown (1 of 2)]
[im 1/15]
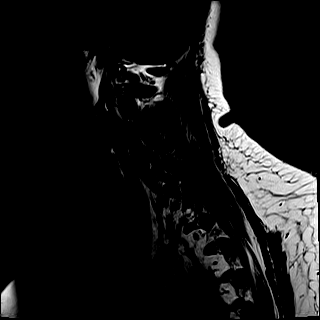
[im 8/15]
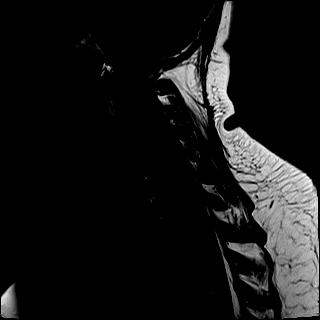
[im 15/15]
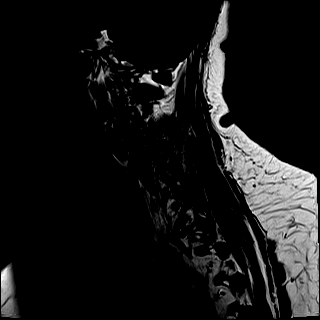

[Series 33: STIR · sagittal · 3.0mm · 0.86mm/px · 3 of 15 slices shown]
[im 1/15]
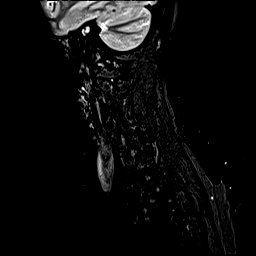
[im 8/15]
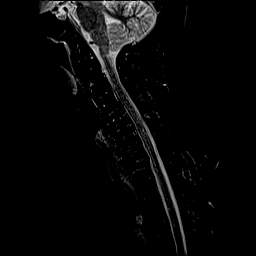
[im 15/15]
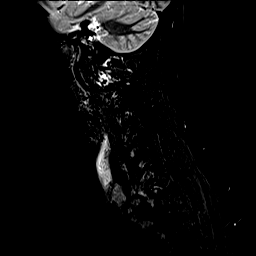

[Series 34: T2 · axial · 3.0mm · 0.66mm/px · z∈[-264,-145]mm · 9 of 40 slices shown (2 of 2)]
[im 1/40]
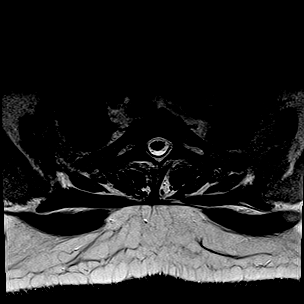
[im 5/40]
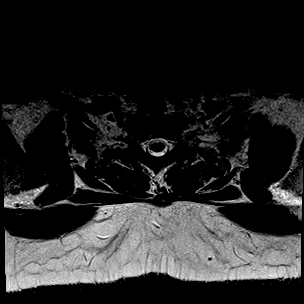
[im 10/40]
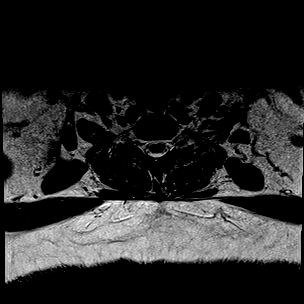
[im 15/40]
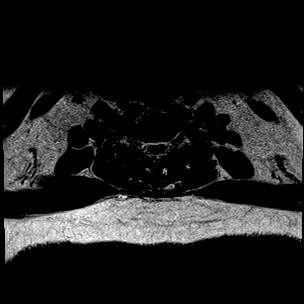
[im 20/40]
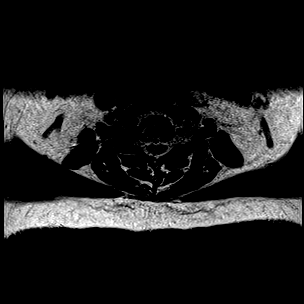
[im 25/40]
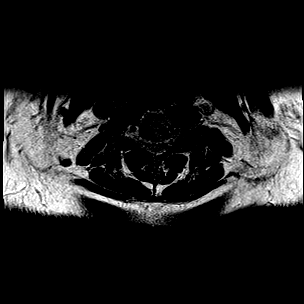
[im 30/40]
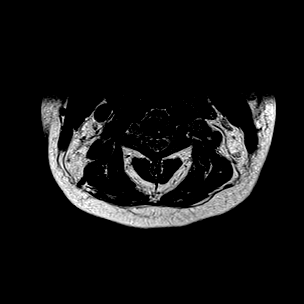
[im 35/40]
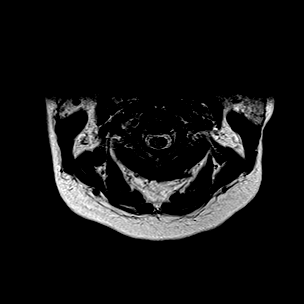
[im 40/40]
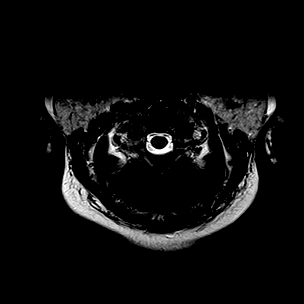

[Series 36: T1 · axial · 3.0mm · 0.35mm/px · z∈[-260,-141]mm · 9 of 40 slices shown (2 of 2)]
[im 1/40]
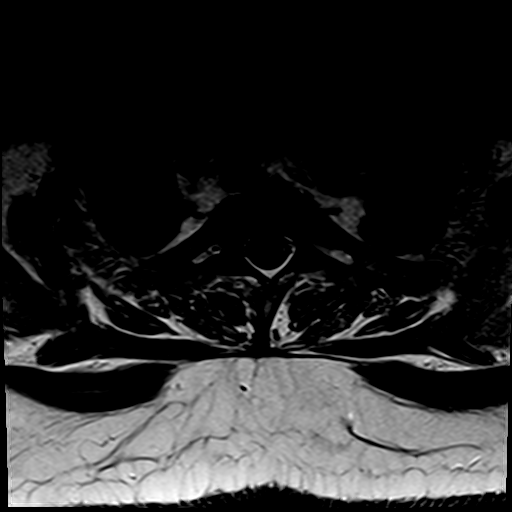
[im 5/40]
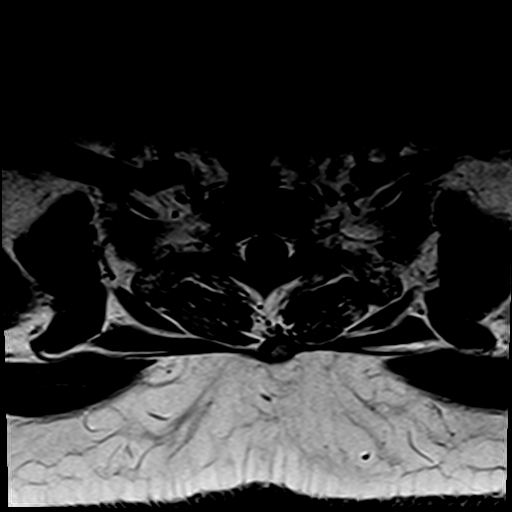
[im 10/40]
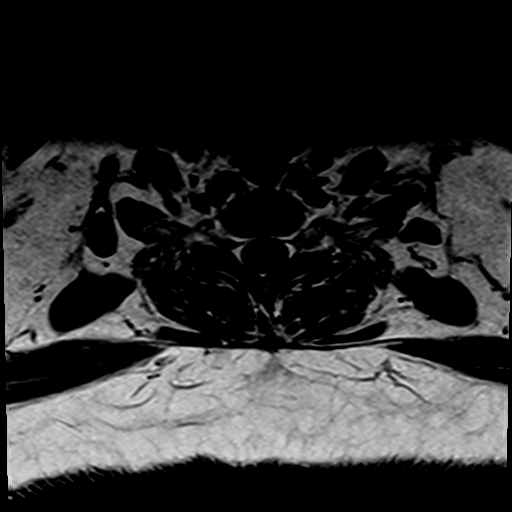
[im 15/40]
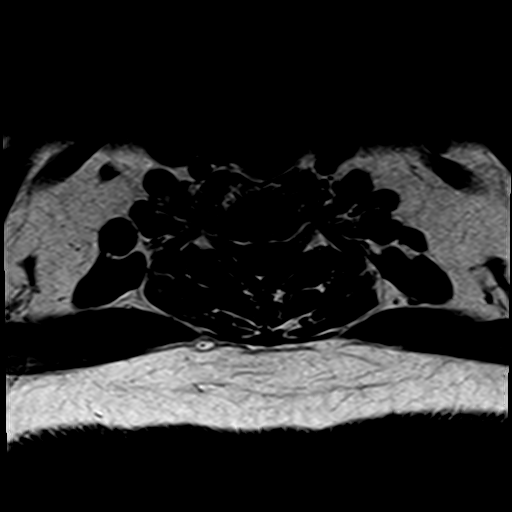
[im 20/40]
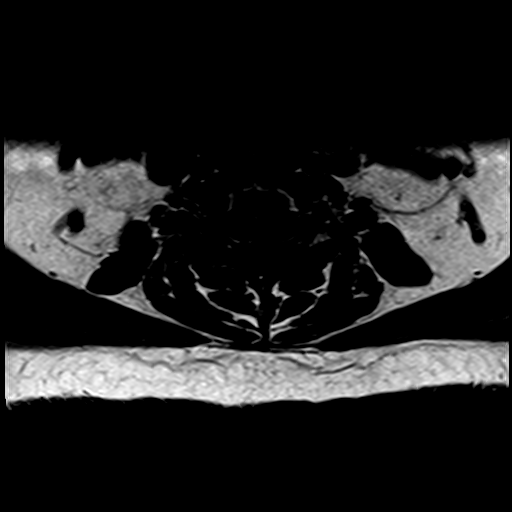
[im 25/40]
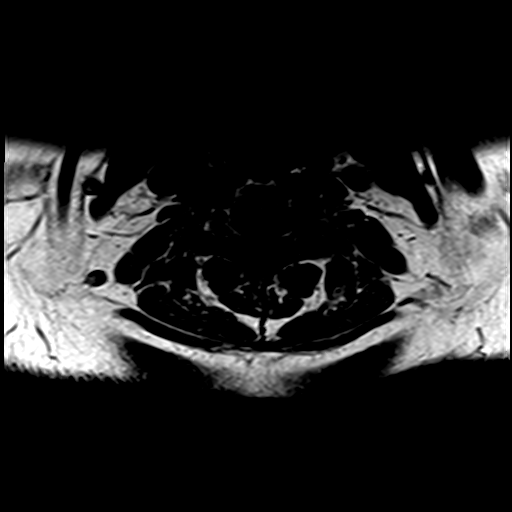
[im 30/40]
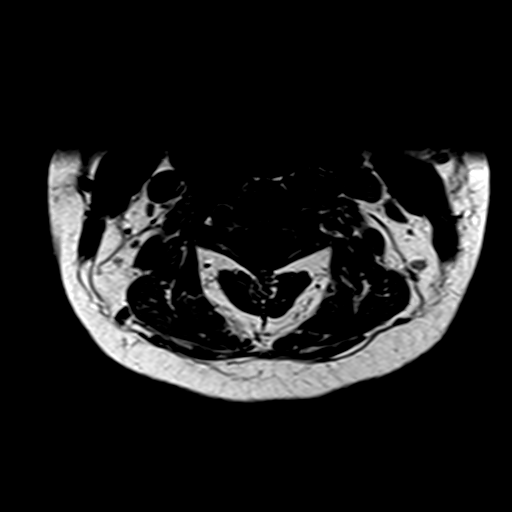
[im 35/40]
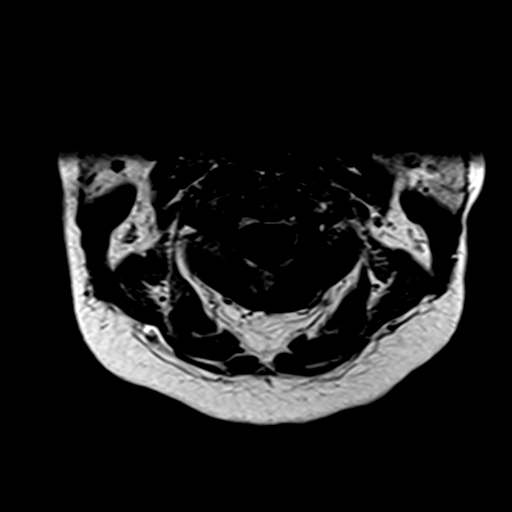
[im 40/40]
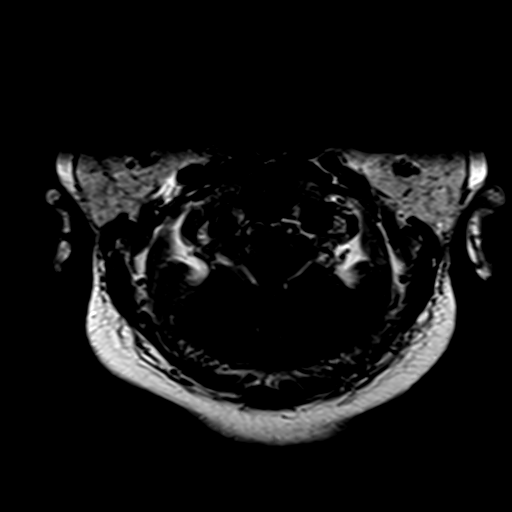

[Series 37: T1 post-contrast · sagittal · 3.0mm · 0.43mm/px · 3 of 15 slices shown]
[im 1/15]
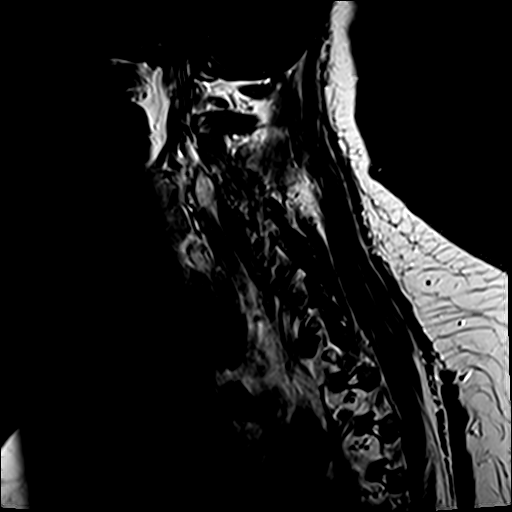
[im 8/15]
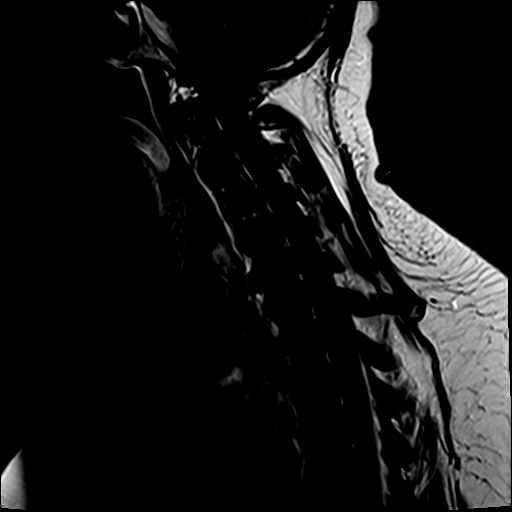
[im 15/15]
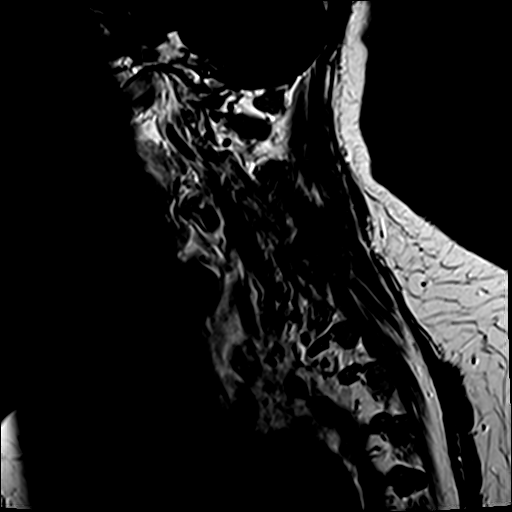

[30 of 48 positions shown; findings below may reference images not displayed]

FINDINGS: MRI CERVICAL SPINE FINDINGS

Alignment: Negative for listhesis.  Reversal of cervical lordosis.

Vertebrae: No fracture, evidence of discitis, or bone lesion.

Cord: Normal signal and morphology.

Posterior Fossa, vertebral arteries, paraspinal tissues: Posterior
fossa described on dedicated study. No perispinal mass or
inflammation.

Disc levels:

C2-3: Unremarkable.

C3-4: Mild disc bulging

C4-5: Mild disc bulging

C5-6: Mild disc bulging with ventral spurring

C6-7: Mild disc bulging and ventral spurring

C7-T1:Unremarkable.

MRI THORACIC SPINE FINDINGS

Alignment:  Stable

Vertebrae: Large primarily osseous mass spanning the T7-T9 vertebral
bodies with essentially complete replacement of T7 and T8 bodies,
growing ventrally through the cortex into the paravertebral space.
On axial images the mass measures up to 76 x 16 mm and craniocaudal
it measures up to 70 mm. No decrease from before, rather dimensions
measure a few mm larger. Posterior cortex loss at the level of T7
and T8 without cord mass effect. Right foraminal impingement at
T8-9, chronic. New fatty signal at the adjacent marrow suggesting
interval radiotherapy.

Cord:  No cord compression or edema.

Paraspinal and other soft tissues: As above

Disc levels:

No significant degenerative changes.
IMPRESSION: 1. Negative for cord compression or signal abnormality.
2. Stable or slightly increased midthoracic and paravertebral mass
as described. Chronic right foraminal impingement at T8-9.

## 2021-04-16 MED ORDER — GADOBUTROL 1 MMOL/ML IV SOLN
10.0000 mL | Freq: Once | INTRAVENOUS | Status: AC | PRN
  Administered 2021-04-16: 10 mL via INTRAVENOUS

## 2021-04-16 MED ORDER — SODIUM CHLORIDE 0.9 % IV BOLUS
500.0000 mL | Freq: Once | INTRAVENOUS | Status: AC
  Administered 2021-04-16: 500 mL via INTRAVENOUS

## 2021-04-16 MED ORDER — DIPHENHYDRAMINE HCL 50 MG/ML IJ SOLN
12.5000 mg | Freq: Once | INTRAMUSCULAR | Status: AC
  Administered 2021-04-16: 12.5 mg via INTRAVENOUS
  Filled 2021-04-16: qty 1

## 2021-04-16 MED ORDER — PROCHLORPERAZINE EDISYLATE 10 MG/2ML IJ SOLN
10.0000 mg | Freq: Once | INTRAMUSCULAR | Status: AC
  Administered 2021-04-16: 10 mg via INTRAVENOUS
  Filled 2021-04-16: qty 2

## 2021-04-16 NOTE — ED Provider Notes (Signed)
  Physical Exam  BP (!) 138/92   Pulse (!) 49   Temp 98.3 F (36.8 C) (Oral)   Resp 19   Wt 111.6 kg   SpO2 99%   BMI 45.00 kg/m   Physical Exam  ED Course/Procedures     Procedures  MDM  Pt signed out to me by Annetta Maw, PA-C. Please see previous notes for further history.   In brief, patient presenting for evaluation of abnormal sensation on the right side and speech difficulties that resolved prior to arrival.  Patient was activated as a code stroke on arrival as symptoms began at 130 this morning.  Initial work-up overall reassuring.  Patient was evaluated by neurology.  Plan for MRIs with and without.  If negative, patient can likely be discharged with OP f/u.   MRI of the T-spine shows lesion which has been noticed before, slightly increased in size but no impingement or mass-effect.  Additionally, there is a new lesion in the brain, however this appears nonemergent, without mass-effect or signs of infection, and can follow-up outpatient.  Discussed findings with patient.  On reevaluation, patient reports headache is resolved and right-sided sensory abnormalities completely resolved.  She is not having any speech deficits.  Consider complex migraine is closed.  As there does not appear to be an acute or life-threatening event requiring neurosurgical evaluation or need for admission, patient can be discharged with close follow-up with her oncologist.  Discussed findings and plan with patient who is agreeable.  Patient appears safe for discharge.  Return precautions given.  Patient states she understands and agrees to plan.       Franchot Heidelberg, PA-C 04/16/21 1034    Dorie Rank, MD 04/17/21 1005

## 2021-04-16 NOTE — ED Notes (Signed)
Petrucelli, PA to triage to evaluate patient.

## 2021-04-16 NOTE — ED Triage Notes (Signed)
Pt states she has had episodes of slurred speech, "feeling funny" and her "tongue feels thick".  Pt's MD told her these were mini-strokes and to come to the ED the next time she experiences these symptoms. Pt states she was laying down around [place] and felt funny, states she felt hot and couldn't talk. Pt states she feels like she is able to get her words out now, but her tongue feels funny still. Pt c/o weakness in bilateral legs and right arm.

## 2021-04-16 NOTE — Code Documentation (Signed)
Stroke Response Nurse Documentation Code Documentation  Mallory Henderson is a 55 y.o. female arriving to [facility] ED via Private Vehicle on 9/17 with past medical hx of HTN, obesity, HLD. On No antithrombotic. Code stroke was activated by ED.   Patient from home where she was LKW at [place] and now complaining of slurred speech.  Stroke team at the bedside on patient arrival. Labs drawn and patient cleared for CT by Dr. Leonette Monarch. Patient to CT with team. NIHSS 0, see documentation for details and code stroke times. Patient with no deficits on exam. The following imaging was completed:  CTH. Patient is not a candidate for IV Thrombolytic due to improving symptoms. Patient is not a candidate for IR due to No LVO.    Bedside handoff with ED RN Beth.    Madelynn Done  Rapid Response RN

## 2021-04-16 NOTE — ED Notes (Signed)
Pt in MRI, will obtain vital signs when pt returns.

## 2021-04-16 NOTE — Discharge Instructions (Signed)
Your work-up today was overall reassuring and that it did not show a stroke or anything requiring neurosurgical intervention today. However your lesion in your thoracic spine has grown slightly. Additionally, there is a lesion in your brain which is new, and will need to be follow-up at closely with your oncologist. Continue taking her medications as prescribed. Return to the emergency room with any new, worsneing, concerning symptoms.

## 2021-04-16 NOTE — ED Notes (Signed)
Pt ambulatory to and from restroom with steady gait 

## 2021-04-16 NOTE — ED Notes (Signed)
Code stroke called. Pt to CT scanner 2.

## 2021-04-16 NOTE — ED Provider Notes (Signed)
[facility] EMERGENCY DEPARTMENT Provider Note   CSN: [id number] Arrival date & time: 04/16/21  7829  An emergency department physician performed an initial assessment on this suspected stroke patient at 0253.  History Chief Complaint  Patient presents with   Aphasia   Code Stroke    Mallory Henderson is a 55 y.o. 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 Patient states she was seated on the couch when she started to feel dizzy and out of it with trouble getting her words out/slurred speech. She states that her sxs started to gradually improve but that she feels generally weak and tingling with tingling/weakness being more prominent to the RUE/RLE. No alleviating/aggravating factors. Has had some similar episodes over the past 1 week, but was feeling normal tonight prior to onset. She states she also had some aching in her chest earlier tonight which has since resolved- has a hx of similar pain related to her cancer, no pain @ present, was not pleuritic, no dyspnea. Denies fever, vomiting, seizure activity, visual disturbance, headache, dyspnea, hemoptysis or leg pain/swelling.   HPI     Past Medical History:  Diagnosis Date   Chronic pain    Currently on Flexeril and meloxicam.,  was given a prescription for by PCP.   COVID-19 virus infection Noticed shortness of breath, cough,   No shortness of breath, cough, chest tightness and fevers.  Malaise. => Not bothered by chronic pain   Hyperlipidemia    Hypertension    Morbid obesity with BMI of 45.0-49.9, adult (HCC)    BMI 45.7.-250pounds.   OSA (obstructive sleep apnea)    Not currently on CPAP.    Patient Active Problem List   Diagnosis Date Noted   Precordial chest pain 09/01/2020   Heart palpitations 09/01/2020    Morbid obesity ([place]) 12/21/2015   Hyperlipidemia 12/21/2015   Evaluate for Sleep apnea 12/21/2015   Obesity, unspecified 11/07/2013   Pure hypercholesterolemia 11/07/2013   Essential hypertension 11/07/2013    Past Surgical History:  Procedure Laterality Date   ABDOMINAL SURGERY     CESAREAN SECTION     GRADUATED EXERCISE TOLERANCE TEST (ETT/GXT)   01/06/2016   Ex 8:01 min.; 85% MPHR @ 7 min - reached 170 bpm = 90% MPHR. 10.3 METS nonischemic test by EKG.   SHOULDER SURGERY     TRANSTHORACIC ECHOCARDIOGRAM  01/06/2016   Normal LV size and function.  EF 55 to 60%.  No R WMA.  Normal atrial sizes.  Normal pressures   TUBAL LIGATION       OB History   No obstetric history on file.     Family History  Problem Relation Age of Onset   Hypertension Father    Heart disease Brother    Kidney disease Brother    Heart attack Brother    Diabetes Paternal Grandfather    Heart disease Paternal Grandfather    Healthy Mother    Stroke Maternal Grandmother     Social History   Tobacco Use   Smoking status: Never   Smokeless tobacco: Never  Vaping Use   Vaping Use: Never used  Substance Use Topics   Alcohol use: Yes    Comment: wine socially   Drug use: No    Home Medications Prior to Admission medications  Medication Sig Start Date End Date Taking? Authorizing Provider  acetaminophen (TYLENOL) 325 MG tablet Take 650 mg by mouth every 6 (six) hours as needed.   Yes [provider]  albuterol (PROVENTIL HFA;VENTOLIN HFA) 108 (90 Base) MCG/ACT inhaler Inhale 1-2 puffs into the lungs every 6 (six) hours as needed for wheezing or shortness of breath. 11/13/17  Yes Jola Schmidt, MD  amitriptyline (ELAVIL) 25 MG tablet Take 25 mg by mouth at bedtime as needed for sleep.   Yes [provider]  cloNIDine (CATAPRES) 0.1 MG tablet Take 0.1 mg by mouth 2 (two) times daily.   Yes [provider]  fluticasone (FLONASE) 50 MCG/ACT nasal spray Place 1 spray into  both nostrils daily as needed for allergies or rhinitis.   Yes [provider]  loratadine (CLARITIN) 10 MG tablet Take 1 tablet (10 mg total) by mouth daily. Patient taking differently: Take 10 mg by mouth daily as needed for allergies. 11/13/17  Yes Jola Schmidt, MD  lovastatin (MEVACOR) 40 MG tablet Take 40 mg by mouth daily. 04/01/17  Yes [provider]  metoprolol tartrate (LOPRESSOR) 100 MG tablet Take 100 mg by mouth 2 (two) times daily. 05/12/17  Yes [provider]  Oxycodone HCl 10 MG TABS Take 10 mg by mouth in the morning, at noon, in the evening, and at bedtime.   Yes [provider]  valsartan-hydrochlorothiazide (DIOVAN-HCT) 80-12.5 MG tablet Take 1/2 tablet of (80/12.5 mg ) by mouth  In the morning Patient taking differently: 1 tablet. by mouth in the morning 09/01/20  Yes Leonie Man, MD  furosemide (LASIX) 40 MG tablet Take 40 mg by mouth daily. 05/14/17   [provider]    Allergies    Hydrocodone, Tramadol, and Latex  Review of Systems   Review of Systems  Constitutional:  Negative for chills and fever.  Eyes:  Negative for visual disturbance.  Respiratory:  Negative for shortness of breath.   Cardiovascular:  Positive for chest pain (resolved). Negative for leg swelling.  Gastrointestinal:  Negative for nausea and vomiting.  Neurological:  Positive for dizziness, speech difficulty, weakness and numbness. Negative for headaches.  All other systems reviewed and are negative.  Physical Exam Updated Vital Signs BP 96/64   Pulse (!) 53   Temp 98 F (36.7 C)   Resp 18   Wt 111.6 kg   SpO2 98%   BMI 45.00 kg/m   Physical Exam Vitals and nursing note reviewed.  Constitutional:      General: She is not in acute distress.    Appearance: She is well-developed. She is not toxic-appearing.  HENT:     Head: Normocephalic and atraumatic.  Eyes:     General:        Right eye: No discharge.        Left eye: No  discharge.     Conjunctiva/sclera: Conjunctivae normal.  Cardiovascular:     Rate and Rhythm: Normal rate and regular rhythm.  Pulmonary:     Effort: Pulmonary effort is normal. No respiratory distress.     Breath sounds: Normal breath sounds. No wheezing, rhonchi or rales.  Abdominal:     General: There is no distension.     Palpations: Abdomen is soft.     Tenderness: There is no abdominal tenderness.  Musculoskeletal:     Cervical back: Neck supple.  Skin:    General: Skin is warm and dry.     Findings: No rash.  Neurological:  Mental Status: She is alert.     Comments: Clear speech. Cn III-XII grossly intact. Sensation grossly intact x4. 5/5 symmetric grip strength & strength with plantar/dorsiflexion bilaterally. Negative pronator drift. Intact finger to nose.   Psychiatric:        Behavior: Behavior normal.    ED Results / Procedures / Treatments   Labs (all labs ordered are listed, but only abnormal results are displayed) Labs Reviewed  CBC - Abnormal; Notable for the following components:      Result Value   Hemoglobin 11.7 (*)    HCT 35.1 (*)    All other components within normal limits  COMPREHENSIVE METABOLIC PANEL - Abnormal; Notable for the following components:   Glucose, Bld 122 (*)    Creatinine, Ser 1.16 (*)    GFR, Estimated 56 (*)    All other components within normal limits  CBG MONITORING, ED - Abnormal; Notable for the following components:   Glucose-Capillary 127 (*)    All other components within normal limits  I-STAT CHEM 8, ED - Abnormal; Notable for the following components:   Creatinine, Ser 1.10 (*)    Glucose, Bld 120 (*)    All other components within normal limits  RESP PANEL BY RT-PCR (FLU A&B, COVID) ARPGX2  ETHANOL  PROTIME-INR  APTT  DIFFERENTIAL  RAPID URINE DRUG SCREEN, HOSP PERFORMED  URINALYSIS, ROUTINE W REFLEX MICROSCOPIC  I-STAT BETA HCG BLOOD, ED ([facility], [facility], [facility] ONLY)  TROPONIN I (HIGH SENSITIVITY)  TROPONIN I (HIGH  SENSITIVITY)    EKG None  Radiology DG Chest Port 1 View  Result Date: 04/16/2021 CLINICAL DATA:  Chest pain.  Slurred speech. EXAM: PORTABLE CHEST 1 VIEW COMPARISON:  08/24/2019 FINDINGS: Cardiac enlargement. No vascular congestion, edema, or consolidation. No pleural effusions. No pneumothorax. Mediastinal contours appear intact. IMPRESSION: Cardiac enlargement.  No active pulmonary disease. Electronically Signed   By: Lucienne Capers M.D.   On: 04/16/2021 03:25   CT HEAD CODE STROKE WO CONTRAST  Result Date: 04/16/2021 CLINICAL DATA:  Code stroke.  Slurred speech EXAM: CT HEAD WITHOUT CONTRAST TECHNIQUE: Contiguous axial images were obtained from the base of the skull through the vertex without intravenous contrast. COMPARISON:  None. FINDINGS: Brain: There is no mass, hemorrhage or extra-axial collection. The size and configuration of the ventricles and extra-axial CSF spaces are normal. The brain parenchyma is normal, without evidence of acute or chronic infarction. Vascular: No abnormal hyperdensity of the major intracranial arteries or dural venous sinuses. No intracranial atherosclerosis. Skull: The visualized skull base, calvarium and extracranial soft tissues are normal. Sinuses/Orbits: No fluid levels or advanced mucosal thickening of the visualized paranasal sinuses. No mastoid or middle ear effusion. The orbits are normal. ASPECTS [facility] Stroke Program Early CT Score) - Ganglionic level infarction (caudate, lentiform nuclei, internal capsule, insula, M1-M3 cortex): 7 - Supraganglionic infarction (M4-M6 cortex): 3 Total score (0-10 with 10 being normal): 10 IMPRESSION: 1. Normal head CT. 2. ASPECTS is 10. These results were called by telephone at the time of interpretation on 04/16/2021 at 3:15 am to provider [facility] , who verbally acknowledged these results. Electronically Signed   By: Ulyses Jarred M.D.   On: 04/16/2021 03:15    Procedures Procedures   Medications  Ordered in ED Medications - No data to display  ED Course  I have reviewed the triage vital signs and the nursing notes.  Pertinent labs & imaging results that were available during my care of the patient were reviewed by me and  considered in my medical decision making (see chart for details).    MDM Rules/Calculators/A&P                           Patient presents to the ED with complaints of dizziness, weakness, paresthesias.  Seen by me in triage, sxs began @ 01:20AM, patient reporting speech abnormalities and R>L weakness/paresthesias, no focal deficits on current exam, discussed with attending & activated code stroke.   CT head wo acute process.  Seen by neurologist Dr. Leonel Ramsay - recommendations:  Ordered  MRI brain, C-spine, T-spine with and without contrast.  If there is cord compression, then would discuss with neurosurgery/radonc.  If she has an acute stroke, she would need admission for work-up of such.  If her imaging is negative, and her symptoms are improved, would favor having her follow-up with her regular physicians at [facility].  Additional history obtained:  Additional history obtained from chart review & nursing note review.   EKG: No STEMI.   Lab Tests:  I Ordered, reviewed, and interpreted labs, which included:  CBC: mild anemia.  CMP: mild elevation in creatinine.  APTT/PT/INR: WNL Preg test: Negative Covid/flu: Negative Ethanol: Negative.  Troponin: WNL.   Imaging Studies ordered:  I ordered imaging studies which included CT head wo contrast & CXR, I independently reviewed, formal radiology impression shows:  CT head: 1. Normal head CT. 2. ASPECTS is 10 CXR:  Cardiac enlargement.  No active pulmonary disease  ED Course:  MRIs ordered by neurology.  On re-assessment patient does report waxing/waning paresthesias/warmth to her right side with a headache. Will trial migraine cocktail & fluids with MRIs pending.  CP has not recurred, patient reports chronic  problem, EKG without acute ischemia and troponin WNL, no pleuritic component or hypoxia.   06:30: Patient care signed out to PA [place] @ change of shift pending MRIs and disposition.   Portions of this note were generated with [employer]. Dictation errors may occur despite best attempts at proofreading.  Final Clinical Impression(s) / ED Diagnoses Final diagnoses:  None    Rx / DC Orders ED Discharge Orders     None        Amaryllis Dyke, PA-C 04/16/21 5038    Fatima Blank, MD 04/16/21 2016

## 2021-04-16 NOTE — ED Notes (Signed)
Pt transported to mri 

## 2021-04-16 NOTE — ED Notes (Signed)
Paged code stroke on pt

## 2021-04-16 NOTE — ED Notes (Signed)
Pt returned from mri

## 2021-04-16 NOTE — Consult Note (Signed)
Neurology Consultation Reason for Consult: Numbness Referring Physician: Ralene Bathe, E  CC: Dizziness  History is obtained from: Patient  HPI: Mallory Henderson is a 55 y.o. female with a history of a thoracic spine plasmacytoma who presents with bilateral numbness and tingling that has been coming and going over the past week.  She states that this is happened two or three times and now she feels unsteady, and like her legs and arms are tingling which is new for her.  She was normal at 1:20 AM, but then sometime shortly thereafter noticed the tingling down in her legs which then gradually ascended.   LKW: 1:20 AM tpa given?: no, mild symptoms, unlikely to be stroke   ROS: A 14 point ROS was performed and is negative except as noted in the HPI.  Past Medical History:  Diagnosis Date   Chronic pain    Currently on Flexeril and meloxicam.,  was given a prescription for by PCP.   COVID-19 virus infection Noticed shortness of breath, cough,   No shortness of breath, cough, chest tightness and fevers.  Malaise. => Not bothered by chronic pain   Hyperlipidemia    Hypertension    Morbid obesity with BMI of 45.0-49.9, adult (HCC)    BMI 45.7.-250pounds.   OSA (obstructive sleep apnea)    Not currently on CPAP.     Family History  Problem Relation Age of Onset   Hypertension Father    Heart disease Brother    Kidney disease Brother    Heart attack Brother    Diabetes Paternal Grandfather    Heart disease Paternal Grandfather    Healthy Mother    Stroke Maternal Grandmother      Social History:  reports that she has never smoked. She has never used smokeless tobacco. She reports current alcohol use. She reports that she does not use drugs.   Exam: Current vital signs: BP (!) 142/91   Pulse 64   Temp 98 F (36.7 C)   Resp 16   Wt 111.6 kg   SpO2 99%   BMI 45.00 kg/m  Vital signs in last 24 hours: Temp:  [98 F (36.7 C)] 98 F (36.7 C) (09/17 0238) Pulse Rate:  [64] 64 (09/17  0236) Resp:  [16] 16 (09/17 0236) BP: (142)/(91) 142/91 (09/17 0236) SpO2:  [99 %] 99 % (09/17 0236) Weight:  [111.6 kg] 111.6 kg (09/17 0301)   Physical Exam  Constitutional: Appears well-developed and well-nourished.  Psych: Affect appropriate to situation Eyes: No scleral injection HENT: No OP obstruction MSK: no joint deformities.  Cardiovascular: Normal rate and regular rhythm.  Respiratory: Effort normal, non-labored breathing GI: Soft.  No distension. There is no tenderness.  Skin: WDI  Neuro: Mental Status: Patient is awake, alert, oriented to person, place, month, year, and situation. Patient is able to give a clear and coherent history. No signs of aphasia or neglect Cranial Nerves: II: Visual Fields are full. Pupils are equal, round, and reactive to light.   III,IV, VI: EOMI without ptosis or diploplia.  V: Facial sensation is symmetric to temperature VII: Facial movement is symmetric.  VIII: hearing is intact to voice X: Uvula elevates symmetrically XI: Shoulder shrug is symmetric. XII: tongue is midline without atrophy or fasciculations.  Motor: Tone is normal. Bulk is normal. 5/5 strength was present in bilateral upper extremities, may be some mild weakness bilateral legs, 4+/5 Sensory: Sensation is symmetric to light touch and temperature in the arms and legs. Cerebellar: FNF intact  bilaterally   I have reviewed labs in epic and the results pertinent to this consultation are: Creatinine 1.16  I have reviewed the images obtained: CT head-no acute findings  Impression: 55 year old female with bilateral paresthesias and unsteadiness is relatively acute in onset.  She does have a thoracic tumor, but this would not explain the paresthesias that she is experiencing in her arms as well.  I do think further imaging is in store, given her metastatic disease I think that we should cast a wide net therefore will order MRI brain, C-spine, T-spine with and without  contrast.  If there is cord compression, then would discuss with neurosurgery/radonc.  If she has an acute stroke, she would need admission for work-up of such.  If her imaging is negative, and her symptoms are improved, would favor having her follow-up with her regular physicians at [facility]  Recommendations: 1) MRI brain, C-spine, T-spine with and without contrast 2) further recommendations following the above imaging.   Roland Rack, MD [facility] [phone]  If 7pm- 7am, please page neurology on call as listed in [place].

## 2021-07-11 ENCOUNTER — Telehealth: Payer: Self-pay

## 2021-07-11 NOTE — Telephone Encounter (Signed)
Letter has been sent to patient informing them that their sleep study has expired. Patient will need to call and schedule an office visit to re-evaluate the need for a sleep study.    

## 2022-08-20 ENCOUNTER — Ambulatory Visit (HOSPITAL_COMMUNITY): Admit: 2022-08-20 | Discharge status: Against Medical Advice | Payer: Medicare Other

## 2022-08-21 ENCOUNTER — Ambulatory Visit (INDEPENDENT_AMBULATORY_CARE_PROVIDER_SITE_OTHER): Payer: Medicare Other

## 2022-08-21 ENCOUNTER — Encounter (HOSPITAL_COMMUNITY): Payer: Self-pay

## 2022-08-21 ENCOUNTER — Ambulatory Visit (HOSPITAL_COMMUNITY)
Admission: EM | Admit: 2022-08-21 | Discharge: 2022-08-21 | Disposition: A | Discharge status: Home / Self Care | Payer: Medicare Other | Attending: Family Medicine | Admitting: Family Medicine

## 2022-08-21 VITALS — BP 148/87 | HR 111 | Temp 98.5°F | Resp 18

## 2022-08-21 DIAGNOSIS — J069 Acute upper respiratory infection, unspecified: Secondary | ICD-10-CM

## 2022-08-21 DIAGNOSIS — R059 Cough, unspecified: Secondary | ICD-10-CM | POA: Diagnosis not present

## 2022-08-21 DIAGNOSIS — J029 Acute pharyngitis, unspecified: Secondary | ICD-10-CM | POA: Diagnosis not present

## 2022-08-21 DIAGNOSIS — Z1152 Encounter for screening for COVID-19: Secondary | ICD-10-CM | POA: Insufficient documentation

## 2022-08-21 DIAGNOSIS — J209 Acute bronchitis, unspecified: Secondary | ICD-10-CM | POA: Diagnosis not present

## 2022-08-21 HISTORY — DX: Malignant (primary) neoplasm, unspecified: C80.1

## 2022-08-21 LAB — POCT RAPID STREP A, ED / UC: Streptococcus, Group A Screen (Direct): NEGATIVE

## 2022-08-21 LAB — SARS CORONAVIRUS 2 (TAT 6-24 HRS): SARS Coronavirus 2: NEGATIVE

## 2022-08-21 MED ORDER — PROMETHAZINE-DM 6.25-15 MG/5ML PO SYRP: 5.0000 mL | ORAL_SOLUTION | Freq: Four times a day (QID) | ORAL | 0 refills | Status: DC | PRN

## 2022-08-21 NOTE — Discharge Instructions (Addendum)
X-ray was clear and did not show any pneumonia or fluid  Your strep test is negative.  Culture of the throat will be sent, and staff will notify you if that is in turn positive.  You have been swabbed for COVID, and the test will result in the next 24 hours. Our staff will call you if positive. If the COVID test is positive, you should quarantine for 5 days from the start of your symptoms.  On days 6-10 from the start of your illness, you should wear a mask if out in public.

## 2022-08-21 NOTE — ED Provider Notes (Signed)
Mallory Henderson    CSN: [id number] Arrival date & time: 08/21/22  3976      History   Chief Complaint Chief Complaint  Patient presents with   Sore Throat   Cough    HPI Mallory Henderson is a 57 y.o. female.    Sore Throat  Cough  Here for cough and congestion that began on January 12.  She saw her primary care then and he prescribed her clindamycin, prednisone, and Robitussin.  She states this is what he gives her every time she is sick.  He did not test her for COVID.  She has had some fever yesterday and then had a sweat.  She started having sore throat 3 days ago.  She has had some wheezing and felt short of breath.  She does have a history of asthma  She is undergoing treatment for multiple myeloma.  No nausea or vomiting or diarrhea.  Her cough does produce some green mucus.  Past Medical History:  Diagnosis Date   Cancer ([place])    Chronic pain    Currently on Flexeril and meloxicam.,  was given a prescription for by PCP.   COVID-19 virus infection Noticed shortness of breath, cough,   No shortness of breath, cough, chest tightness and fevers.  Malaise. => Not bothered by chronic pain   Hyperlipidemia    Hypertension    Morbid obesity with BMI of 45.0-49.9, adult (HCC)    BMI 45.7.-250pounds.   OSA (obstructive sleep apnea)    Not currently on CPAP.    Patient Active Problem List   Diagnosis Date Noted   Precordial chest pain 09/01/2020   Heart palpitations 09/01/2020   Morbid obesity ([place]) 12/21/2015   Hyperlipidemia 12/21/2015   Evaluate for Sleep apnea 12/21/2015   Obesity, unspecified 11/07/2013   Pure hypercholesterolemia 11/07/2013   Essential hypertension 11/07/2013    Past Surgical History:  Procedure Laterality Date   ABDOMINAL SURGERY     CESAREAN SECTION     GRADUATED EXERCISE TOLERANCE TEST (ETT/GXT)   01/06/2016   Ex 8:01 min.; 85% MPHR @ 7 min - reached 170 bpm = 90% MPHR. 10.3 METS nonischemic test by EKG.   SHOULDER SURGERY      SPINE SURGERY     TRANSTHORACIC ECHOCARDIOGRAM  01/06/2016   Normal LV size and function.  EF 55 to 60%.  No R WMA.  Normal atrial sizes.  Normal pressures   TUBAL LIGATION      OB History   No obstetric history on file.      Home Medications    Prior to Admission medications   Medication Sig Start Date End Date Taking? Authorizing Provider  apixaban (ELIQUIS) 2.5 MG TABS tablet Take 2.5 mg by mouth 2 (two) times daily. 10/11/21  Yes [provider]  lenalidomide (REVLIMID) 10 MG capsule Take 10 mg by mouth daily. 07/27/22  Yes [provider]  promethazine-dextromethorphan (PROMETHAZINE-DM) 6.25-15 MG/5ML syrup Take 5 mLs by mouth 4 (four) times daily as needed for cough. 08/21/22  Yes Barrett Henle, MD  acetaminophen (TYLENOL) 325 MG tablet Take 650 mg by mouth every 6 (six) hours as needed.    [provider]  albuterol (PROVENTIL HFA;VENTOLIN HFA) 108 (90 Base) MCG/ACT inhaler Inhale 1-2 puffs into the lungs every 6 (six) hours as needed for wheezing or shortness of breath. 11/13/17   Jola Schmidt, MD  amitriptyline (ELAVIL) 25 MG tablet Take 25 mg by mouth at bedtime as needed for sleep.  [provider]  cloNIDine (CATAPRES) 0.1 MG tablet Take 0.1 mg by mouth 2 (two) times daily.    [provider]  fluticasone (FLONASE) 50 MCG/ACT nasal spray Place 1 spray into both nostrils daily as needed for allergies or rhinitis.    [provider]  furosemide (LASIX) 40 MG tablet Take 40 mg by mouth daily. 05/14/17   [provider]  loratadine (CLARITIN) 10 MG tablet Take 1 tablet (10 mg total) by mouth daily. Patient taking differently: Take 10 mg by mouth daily as needed for allergies. 11/13/17   Jola Schmidt, MD  lovastatin (MEVACOR) 40 MG tablet Take 40 mg by mouth daily. 04/01/17   [provider]  metoprolol tartrate (LOPRESSOR) 100 MG tablet Take 100 mg by mouth 2 (two) times daily. 05/12/17   [provider]  Oxycodone HCl 10 MG TABS Take 10 mg by mouth in the morning, at noon, in the evening, and at bedtime.    [provider]  valsartan-hydrochlorothiazide (DIOVAN-HCT) 80-12.5 MG tablet Take 1/2 tablet of (80/12.5 mg ) by mouth  In the morning Patient taking differently: 1 tablet. by mouth in the morning 09/01/20   Leonie Man, MD    Family History Family History  Problem Relation Age of Onset   Hypertension Father    Heart disease Brother    Kidney disease Brother    Heart attack Brother    Diabetes Paternal Grandfather    Heart disease Paternal Grandfather    Healthy Mother    Stroke Maternal Grandmother     Social History Social History   Tobacco Use   Smoking status: Never   Smokeless tobacco: Never  Vaping Use   Vaping Use: Never used  Substance Use Topics   Alcohol use: Yes    Comment: wine socially   Drug use: No     Allergies   Hydrocodone, Tramadol, and Latex   Review of Systems Review of Systems  Respiratory:  Positive for cough.      Physical Exam Triage Vital Signs ED Triage Vitals  Enc Vitals Group     BP 08/21/22 0911 (!) 148/87     Pulse Rate 08/21/22 0911 (!) 111     Resp 08/21/22 0911 18     Temp 08/21/22 0911 98.5 F (36.9 C)     Temp Source 08/21/22 0911 Oral     SpO2 08/21/22 0911 97 %     Weight --      Height --      Head Circumference --      Peak Flow --      Pain Score 08/21/22 0907 6     Pain Loc --      Pain Edu? --      Excl. in [place]? --    No data found.  Updated Vital Signs BP (!) 148/87 (BP Location: Left Arm)   Pulse (!) 111   Temp 98.5 F (36.9 C) (Oral)   Resp 18   SpO2 97%   Visual Acuity Right Eye Distance:   Left Eye Distance:   Bilateral Distance:    Right Eye Near:   Left Eye Near:    Bilateral Near:     Physical Exam Vitals reviewed.  Constitutional:      General: She is not in acute distress.    Appearance: She is not toxic-appearing.  HENT:     Right Ear: Tympanic  membrane and ear canal normal.     Left Ear: Tympanic  membrane and ear canal normal.     Nose: Congestion present.     Mouth/Throat:     Mouth: Mucous membranes are moist.     Comments: Soles are hypertrophied 2+ and there is white and yellow mucus in the crypts.  There is no erythema. Eyes:     Extraocular Movements: Extraocular movements intact.     Conjunctiva/sclera: Conjunctivae normal.     Pupils: Pupils are equal, round, and reactive to light.  Cardiovascular:     Rate and Rhythm: Normal rate and regular rhythm.     Heart sounds: No murmur heard. Pulmonary:     Effort: No respiratory distress.     Breath sounds: No stridor. No wheezing, rhonchi or rales.  Musculoskeletal:     Cervical back: Neck supple.  Lymphadenopathy:     Cervical: No cervical adenopathy.  Skin:    Capillary Refill: Capillary refill takes less than 2 seconds.     Coloration: Skin is not jaundiced or pale.  Neurological:     General: No focal deficit present.     Mental Status: She is alert and oriented to person, place, and time.  Psychiatric:        Behavior: Behavior normal.      UC Treatments / Results  Labs (all labs ordered are listed, but only abnormal results are displayed) Labs Reviewed  CULTURE, GROUP A STREP ([place])  SARS CORONAVIRUS 2 (TAT 6-24 HRS)  POCT RAPID STREP A, ED / UC    EKG   Radiology DG Chest 2 View  Result Date: 08/21/2022 CLINICAL DATA:  Productive cough over the last 10 days EXAM: CHEST - 2 VIEW COMPARISON:  04/16/2021 FINDINGS: Heart size is normal. The lungs are clear. No infiltrate, collapse or effusion. Thoracic region spinal fusion, newly performed since the prior chest radiography. IMPRESSION: No active cardiopulmonary disease. Thoracic region spinal fusion. Electronically Signed   By: Nelson Chimes M.D.   On: 08/21/2022 09:59    Procedures Procedures (including critical care time)  Medications Ordered in UC Medications - No data to display  Initial  Impression / Assessment and Plan / UC Course  I have reviewed the triage vital signs and the nursing notes.  Pertinent labs & imaging results that were available during my care of the patient were reviewed by me and considered in my medical decision making (see chart for details).        Chest x-ray is clear  Rapid strep is negative.  Throat culture is sent and we will notify and treat per protocol if positive  Since the sore throat and the increase in mucus production began 3 days ago, it is possible that this is a new viral illness on top of her illness that started 10 days ago.  She is therefore swabbed for COVID, and if positive I think she is a candidate for molnupiravir.  She takes Eliquis and so she is not a candidate for Paxlovid  He has albuterol still at home, and she did not improve at all with the prior treatment of prednisone, so I am not going to do any further steroids for her. Final Clinical Impressions(s) / UC Diagnoses   Final diagnoses:  Acute bronchitis, unspecified organism  Acute pharyngitis, unspecified etiology  Viral upper respiratory tract infection     Discharge Instructions      X-ray was clear and did not show any pneumonia or fluid  Your strep test is negative.  Culture of the throat will be sent, and  staff will notify you if that is in turn positive.  You have been swabbed for COVID, and the test will result in the next 24 hours. Our staff will call you if positive. If the COVID test is positive, you should quarantine for 5 days from the start of your symptoms.  On days 6-10 from the start of your illness, you should wear a mask if out in public.       ED Prescriptions     Medication Sig Dispense Auth. Provider   promethazine-dextromethorphan (PROMETHAZINE-DM) 6.25-15 MG/5ML syrup Take 5 mLs by mouth 4 (four) times daily as needed for cough. 118 mL Barrett Henle, MD      I have reviewed the PDMP during this encounter.   Barrett Henle, MD 08/21/22 1022

## 2022-08-21 NOTE — ED Triage Notes (Signed)
Pt reports since last Friday had cold. Throat is raw, cough and congestion, headache, body aches, fevers and eye irratation. Took prednisone, clindamycin, Robitussin x 2 bottles.

## 2022-08-23 LAB — CULTURE, GROUP A STREP (THRC)

## 2022-11-29 ENCOUNTER — Ambulatory Visit (HOSPITAL_COMMUNITY): Payer: Self-pay

## 2023-02-02 ENCOUNTER — Ambulatory Visit (HOSPITAL_COMMUNITY)
Admission: RE | Admit: 2023-02-02 | Discharge: 2023-02-02 | Disposition: A | Discharge status: Home / Self Care | Payer: Medicare Other | Source: Ambulatory Visit | Attending: Internal Medicine | Admitting: Internal Medicine

## 2023-02-02 ENCOUNTER — Encounter (HOSPITAL_COMMUNITY): Payer: Self-pay

## 2023-02-02 VITALS — BP 119/77 | HR 63 | Temp 98.8°F | Resp 17

## 2023-02-02 DIAGNOSIS — S46811A Strain of other muscles, fascia and tendons at shoulder and upper arm level, right arm, initial encounter: Secondary | ICD-10-CM | POA: Diagnosis not present

## 2023-02-02 MED ORDER — TIZANIDINE HCL 2 MG PO CAPS: 2.0000 mg | ORAL_CAPSULE | Freq: Three times a day (TID) | ORAL | 0 refills | Status: AC

## 2023-02-02 NOTE — ED Triage Notes (Signed)
Pt c/o neck pain that radiates down right arm with tingling and numbness since last night. Tried heat. Pt reports fell last Sunday but did not think hurt anything.

## 2023-02-02 NOTE — ED Provider Notes (Signed)
[facility]    CSN: [id number] Arrival date & time: 02/02/23  1539      History   Chief Complaint Chief Complaint  Patient presents with   Neck Pain    HPI Mallory Henderson is a 57 y.o. female.   Patient presents to urgent care for evaluation of right neck pain that radiates to the right upper arm starting yesterday.  No recent trauma/injury to the right arm or the chest. Pain is worse with movement. She attends physical therapy for her chronic back pain and currently takes percocet daily for this as well. Last PT appointment was 3 days ago with [facility]. Denies recent changes in exercises/activity. No shortness of breath, chest pain, dizziness, extremity weakness, or paresthesias to the right arm. Has not attempted use of any OTC medicines for pain. States her "percocet doesn't touch it".    Neck Pain   Past Medical History:  Diagnosis Date   Cancer ([facility]CC)    Chronic pain    Currently on Flexeril and meloxicam.,  was given a prescription for by PCP.   COVID-19 virus infection Noticed shortness of breath, cough,   No shortness of breath, cough, chest tightness and fevers.  Malaise. => Not bothered by chronic pain   Hyperlipidemia    Hypertension    Morbid obesity with BMI of 45.0-49.9, adult (HCC)    BMI 45.7.-250pounds.   OSA (obstructive sleep apnea)    Not currently on CPAP.    Patient Active Problem List   Diagnosis Date Noted   Precordial chest pain 09/01/2020   Heart palpitations 09/01/2020   Morbid obesity ([facility]CC) 12/21/2015   Hyperlipidemia 12/21/2015   Evaluate for Sleep apnea 12/21/2015   Obesity, unspecified 11/07/2013   Pure hypercholesterolemia 11/07/2013   Essential hypertension 11/07/2013    Past Surgical History:  Procedure Laterality Date   ABDOMINAL SURGERY     CESAREAN SECTION     GRADUATED EXERCISE TOLERANCE TEST (ETT/GXT)   01/06/2016   Ex 8:01 min.; 85% MPHR @ 7 min - reached 170 bpm = 90% MPHR. 10.3 METS nonischemic  test by EKG.   SHOULDER SURGERY     SPINE SURGERY     TRANSTHORACIC ECHOCARDIOGRAM  01/06/2016   Normal LV size and function.  EF 55 to 60%.  No R WMA.  Normal atrial sizes.  Normal pressures   TUBAL LIGATION      OB History   No obstetric history on file.      Home Medications    Prior to Admission medications   Medication Sig Start Date End Date Taking? Authorizing Provider  tizanidine (ZANAFLEX) 2 MG capsule Take 1 capsule (2 mg total) by mouth 3 (three) times daily. 02/02/23  Yes Carlisle Beers, FNP  acetaminophen (TYLENOL) 325 MG tablet Take 650 mg by mouth every 6 (six) hours as needed.    [provider]  albuterol (PROVENTIL HFA;VENTOLIN HFA) 108 (90 Base) MCG/ACT inhaler Inhale 1-2 puffs into the lungs every 6 (six) hours as needed for wheezing or shortness of breath. 11/13/17   Azalia Bilis, MD  amitriptyline (ELAVIL) 25 MG tablet Take 25 mg by mouth at bedtime as needed for sleep.    [provider]  apixaban (ELIQUIS) 2.5 MG TABS tablet Take 2.5 mg by mouth 2 (two) times daily. 10/11/21   [provider]  cloNIDine (CATAPRES) 0.1 MG tablet Take 0.1 mg by mouth 2 (two) times daily.    [provider]  fluticasone (FLONASE) 50  MCG/ACT nasal spray Place 1 spray into both nostrils daily as needed for allergies or rhinitis.    [provider]  furosemide (LASIX) 40 MG tablet Take 40 mg by mouth daily. 05/14/17   [provider]  lenalidomide (REVLIMID) 10 MG capsule Take 10 mg by mouth daily. 07/27/22   [provider]  loratadine (CLARITIN) 10 MG tablet Take 1 tablet (10 mg total) by mouth daily. Patient taking differently: Take 10 mg by mouth daily as needed for allergies. 11/13/17   Azalia Bilis, MD  lovastatin (MEVACOR) 40 MG tablet Take 40 mg by mouth daily. 04/01/17   [provider]  metoprolol tartrate (LOPRESSOR) 100 MG tablet Take 100 mg by mouth 2 (two) times daily. 05/12/17   [provider]  Oxycodone HCl 10 MG TABS Take 10 mg by mouth in the morning, at noon, in the evening, and at bedtime.    [provider]  promethazine-dextromethorphan (PROMETHAZINE-DM) 6.25-15 MG/5ML syrup Take 5 mLs by mouth 4 (four) times daily as needed for cough. 08/21/22   Zenia Resides, MD  valsartan-hydrochlorothiazide (DIOVAN-HCT) 80-12.5 MG tablet Take 1/2 tablet of (80/12.5 mg ) by mouth  In the morning Patient taking differently: 1 tablet. by mouth in the morning 09/01/20   Marykay Lex, MD    Family History Family History  Problem Relation Age of Onset   Hypertension Father    Heart disease Brother    Kidney disease Brother    Heart attack Brother    Diabetes Paternal Grandfather    Heart disease Paternal Grandfather    Healthy Mother    Stroke Maternal Grandmother     Social History Social History   Tobacco Use   Smoking status: Never   Smokeless tobacco: Never  Vaping Use   Vaping Use: Never used  Substance Use Topics   Alcohol use: Yes    Comment: wine socially   Drug use: No     Allergies   Hydrocodone, Tramadol, and Latex   Review of Systems Review of Systems  Musculoskeletal:  Positive for neck pain.  Per HPI   Physical Exam Triage Vital Signs ED Triage Vitals [02/02/23 1610]  Enc Vitals Group     BP 119/77     Pulse Rate 63     Resp 17     Temp 98.8 F (37.1 C)     Temp Source Oral     SpO2 97 %     Weight      Height      Head Circumference      Peak Flow      Pain Score 8     Pain Loc      Pain Edu?      Excl. in [place]?    No data found.  Updated Vital Signs BP 119/77 (BP Location: Right Arm)   Pulse 63   Temp 98.8 F (37.1 C) (Oral)   Resp 17   SpO2 97%   Visual Acuity Right Eye Distance:   Left Eye Distance:   Bilateral Distance:    Right Eye Near:   Left Eye Near:    Bilateral Near:     Physical Exam Vitals and nursing note reviewed.  Constitutional:      Appearance: She is not ill-appearing or  toxic-appearing.  HENT:     Head: Normocephalic and atraumatic.     Right Ear: Hearing and external ear normal.     Left Ear: Hearing and external ear normal.  Nose: Nose normal.     Mouth/Throat:     Lips: Pink.  Eyes:     General: Lids are normal. Vision grossly intact. Gaze aligned appropriately.     Extraocular Movements: Extraocular movements intact.     Conjunctiva/sclera: Conjunctivae normal.  Neck:     Trachea: Trachea and phonation normal.  Pulmonary:     Effort: Pulmonary effort is normal.  Musculoskeletal:     Right shoulder: Normal.     Left shoulder: Normal.     Right upper arm: Normal.     Left upper arm: Normal.     Cervical back: Normal range of motion and neck supple. No edema, erythema, signs of trauma, rigidity, torticollis or crepitus. Pain with movement and muscular tenderness (Tender to mulitple points of palpation over the right trapezius muscle) present. No spinous process tenderness. Normal range of motion.     Comments: Strength and sensation intact to distal right upper extremity. +2 bilateral radial pulses.  Lymphadenopathy:     Cervical: No cervical adenopathy.  Skin:    General: Skin is warm and dry.     Capillary Refill: Capillary refill takes less than 2 seconds.     Findings: No rash.  Neurological:     General: No focal deficit present.     Mental Status: She is alert and oriented to person, place, and time. Mental status is at baseline.     Cranial Nerves: No dysarthria or facial asymmetry.  Psychiatric:        Mood and Affect: Mood normal.        Speech: Speech normal.        Behavior: Behavior normal.        Thought Content: Thought content normal.        Judgment: Judgment normal.      UC Treatments / Results  Labs (all labs ordered are listed, but only abnormal results are displayed) Labs Reviewed - No data to display  EKG   Radiology No results found.  Procedures Procedures (including critical care time)  Medications  Ordered in UC Medications - No data to display  Initial Impression / Assessment and Plan / UC Course  I have reviewed the triage vital signs and the nursing notes.  Pertinent labs & imaging results that were available during my care of the patient were reviewed by me and considered in my medical decision making (see chart for details).   1. Strain of right trapezius muscle Evaluation suggests pain is muscular in nature. Will manage this with rest, gentle ROM exercises, heat therapy, home percocet as needed for pain, and as needed use of muscle relaxer. Drowsiness precautions discussed regarding muscle relaxer use. Imaging: no indication for imaging based on stable musculoskeletal exam findings May follow-up with orthopedics as needed. Advised to use tylenol 325mg  every 6 hours as needed for extra pain relief. Discussed precautions in using tylenol with percocet.  Discussed red flag signs and symptoms of worsening condition,when to call the PCP office, return to urgent care, and when to seek higher level of care in the emergency department. Counseled patient regarding appropriate use of medications and potential side effects for all medications recommended or prescribed today. Patient verbalizes understanding and agreement with plan. Discharged in stable condition.     Final Clinical Impressions(s) / UC Diagnoses   Final diagnoses:  Strain of right trapezius muscle, initial encounter     Discharge Instructions      Your pain is likely due to a muscle strain  which will improve on its own with time.   - Continue using your medicines for chronic pain. - You may also take the prescribed muscle relaxer as directed as needed for muscle aches/spasm.  Do not take this medication and drive or drink alcohol as it can make you sleepy.  Mainly use this medicine at nighttime as needed. - Apply heat 20 minutes on then 20 minutes off and perform gentle range of motion exercises to the area of  greatest pain to prevent muscle stiffness and provide further pain relief.   If you develop any new or worsening symptoms or do not improve in the next 2 to 3 days, please return.  If your symptoms are severe, please go to the emergency room.  Follow-up with your primary care provider for further evaluation and management of your symptoms as well as ongoing wellness visits.  I hope you feel better!     ED Prescriptions     Medication Sig Dispense Auth. Provider   tizanidine (ZANAFLEX) 2 MG capsule Take 1 capsule (2 mg total) by mouth 3 (three) times daily. 20 capsule Carlisle Beers, FNP      PDMP not reviewed this encounter.   Carlisle Beers, FNP 02/02/23 1700

## 2023-02-02 NOTE — Discharge Instructions (Signed)
Your pain is likely due to a muscle strain which will improve on its own with time.   - Continue using your medicines for chronic pain. - You may also take the prescribed muscle relaxer as directed as needed for muscle aches/spasm.  Do not take this medication and drive or drink alcohol as it can make you sleepy.  Mainly use this medicine at nighttime as needed. - Apply heat 20 minutes on then 20 minutes off and perform gentle range of motion exercises to the area of greatest pain to prevent muscle stiffness and provide further pain relief.   If you develop any new or worsening symptoms or do not improve in the next 2 to 3 days, please return.  If your symptoms are severe, please go to the emergency room.  Follow-up with your primary care provider for further evaluation and management of your symptoms as well as ongoing wellness visits.  I hope you feel better!

## 2023-06-04 ENCOUNTER — Encounter (HOSPITAL_COMMUNITY): Payer: Self-pay

## 2023-06-04 ENCOUNTER — Ambulatory Visit (HOSPITAL_COMMUNITY)
Admission: EM | Admit: 2023-06-04 | Discharge: 2023-06-04 | Disposition: A | Discharge status: Home / Self Care | Payer: Medicare Other | Attending: Internal Medicine | Admitting: Internal Medicine

## 2023-06-04 DIAGNOSIS — J069 Acute upper respiratory infection, unspecified: Secondary | ICD-10-CM

## 2023-06-04 LAB — POC COVID19/FLU A&B COMBO
Covid Antigen, POC: NEGATIVE
Influenza A Antigen, POC: NEGATIVE
Influenza B Antigen, POC: NEGATIVE

## 2023-06-04 MED ORDER — PREDNISONE 20 MG PO TABS: 40.0000 mg | ORAL_TABLET | Freq: Every day | ORAL | 0 refills | Status: AC

## 2023-06-04 MED ORDER — PROMETHAZINE-DM 6.25-15 MG/5ML PO SYRP: 5.0000 mL | ORAL_SOLUTION | Freq: Four times a day (QID) | ORAL | 0 refills | Status: AC | PRN

## 2023-06-04 NOTE — ED Triage Notes (Signed)
Patient here today with c/o cough, runny nose, SOB, wheeze, and itchy throat since Friday. Patient states that she had a fever but that it gone. She has taken Robitussin and used a nebulizer treatment with some relief. Patient states that symptoms are worse at night.

## 2023-06-04 NOTE — Discharge Instructions (Addendum)
Please increase oral fluid intake Please take medications as prescribed Continue to use your inhaler and nebulizer as needed Will call you with recommendations if labs are abnormal. If you have worsening symptoms please refer to return to urgent care to be reevaluated

## 2023-06-06 NOTE — ED Provider Notes (Signed)
[facility]    CSN: [id number] Arrival date & time: 06/04/23  1855      History   Chief Complaint Chief Complaint  Patient presents with   Cough    HPI Mallory Henderson is a 57 y.o. female with a history of asthma comes to urgent care with 3-day history of nasal congestion, runny nose, shortness of breath, chest tightness and wheezing.  Patient's symptoms started fairly abruptly and has been persistent.  She had fever at the outset of her symptoms but the fever is currently subsided.  She is taking Robitussin with no relief.  She has been using albuterol inhaler and had a nebulizer treatment earlier today.  Patient's symptoms have not subsided.  She endorses sick contacts with similar symptoms.  No nausea, vomiting or diarrhea  HPI  Past Medical History:  Diagnosis Date   Cancer (HCC)    Chronic pain    Currently on Flexeril and meloxicam.,  was given a prescription for by PCP.   COVID-19 virus infection Noticed shortness of breath, cough,   No shortness of breath, cough, chest tightness and fevers.  Malaise. => Not bothered by chronic pain   Hyperlipidemia    Hypertension    Morbid obesity with BMI of 45.0-49.9, adult (HCC)    BMI 45.7.-250pounds.   OSA (obstructive sleep apnea)    Not currently on CPAP.    Patient Active Problem List   Diagnosis Date Noted   Precordial chest pain 09/01/2020   Heart palpitations 09/01/2020   Morbid obesity (HCC) 12/21/2015   Hyperlipidemia 12/21/2015   Evaluate for Sleep apnea 12/21/2015   Obesity, unspecified 11/07/2013   Pure hypercholesterolemia 11/07/2013   Essential hypertension 11/07/2013    Past Surgical History:  Procedure Laterality Date   ABDOMINAL SURGERY     CESAREAN SECTION     GRADUATED EXERCISE TOLERANCE TEST (ETT/GXT)   01/06/2016   Ex 8:01 min.; 85% MPHR @ 7 min - reached 170 bpm = 90% MPHR. 10.3 METS nonischemic test by EKG.   SHOULDER SURGERY     SPINE SURGERY     TRANSTHORACIC ECHOCARDIOGRAM   01/06/2016   Normal LV size and function.  EF 55 to 60%.  No R WMA.  Normal atrial sizes.  Normal pressures   TUBAL LIGATION      OB History   No obstetric history on file.      Home Medications    Prior to Admission medications   Medication Sig Start Date End Date Taking? Authorizing Provider  predniSONE (DELTASONE) 20 MG tablet Take 2 tablets (40 mg total) by mouth daily for 5 days. 06/04/23 06/09/23 Yes Karine Garn, Britta Mccreedy, MD  acetaminophen (TYLENOL) 325 MG tablet Take 650 mg by mouth every 6 (six) hours as needed.    [provider]  albuterol (PROVENTIL HFA;VENTOLIN HFA) 108 (90 Base) MCG/ACT inhaler Inhale 1-2 puffs into the lungs every 6 (six) hours as needed for wheezing or shortness of breath. 11/13/17   Azalia Bilis, MD  amitriptyline (ELAVIL) 25 MG tablet Take 25 mg by mouth at bedtime as needed for sleep.    [provider]  apixaban (ELIQUIS) 2.5 MG TABS tablet Take 2.5 mg by mouth 2 (two) times daily. 10/11/21   [provider]  cloNIDine (CATAPRES) 0.1 MG tablet Take 0.1 mg by mouth 2 (two) times daily.    [provider]  fluticasone (FLONASE) 50 MCG/ACT nasal spray Place 1 spray into both nostrils daily as needed for allergies or rhinitis.  [provider]  furosemide (LASIX) 40 MG tablet Take 40 mg by mouth daily. 05/14/17   [provider]  lenalidomide (REVLIMID) 10 MG capsule Take 10 mg by mouth daily. 07/27/22   [provider]  loratadine (CLARITIN) 10 MG tablet Take 1 tablet (10 mg total) by mouth daily. Patient taking differently: Take 10 mg by mouth daily as needed for allergies. 11/13/17   Azalia Bilis, MD  lovastatin (MEVACOR) 40 MG tablet Take 40 mg by mouth daily. 04/01/17   [provider]  metoprolol tartrate (LOPRESSOR) 100 MG tablet Take 100 mg by mouth 2 (two) times daily. 05/12/17   [provider]  Oxycodone HCl 10 MG TABS Take 10 mg by mouth in the morning, at noon, in the  evening, and at bedtime.    [provider]  promethazine-dextromethorphan (PROMETHAZINE-DM) 6.25-15 MG/5ML syrup Take 5 mLs by mouth 4 (four) times daily as needed for cough. 06/04/23   Neale Marzette, Britta Mccreedy, MD  tizanidine (ZANAFLEX) 2 MG capsule Take 1 capsule (2 mg total) by mouth 3 (three) times daily. 02/02/23   Carlisle Beers, FNP  valsartan-hydrochlorothiazide (DIOVAN-HCT) 80-12.5 MG tablet Take 1/2 tablet of (80/12.5 mg ) by mouth  In the morning Patient taking differently: 1 tablet. by mouth in the morning 09/01/20   Marykay Lex, MD    Family History Family History  Problem Relation Age of Onset   Hypertension Father    Heart disease Brother    Kidney disease Brother    Heart attack Brother    Diabetes Paternal Grandfather    Heart disease Paternal Grandfather    Healthy Mother    Stroke Maternal Grandmother     Social History Social History   Tobacco Use   Smoking status: Never   Smokeless tobacco: Never  Vaping Use   Vaping status: Never Used  Substance Use Topics   Alcohol use: Yes    Comment: wine socially   Drug use: No     Allergies   Hydrocodone, Tramadol, and Latex   Review of Systems Review of Systems As per HPI  Physical Exam Triage Vital Signs ED Triage Vitals [06/04/23 2001]  Encounter Vitals Group     BP (!) 143/96     Systolic BP Percentile      Diastolic BP Percentile      Pulse Rate 66     Resp 16     Temp 98.7 F (37.1 C)     Temp Source Oral     SpO2 96 %     Weight 240 lb (108.9 kg)     Height 5\' 2"  (1.575 m)     Head Circumference      Peak Flow      Pain Score 0     Pain Loc      Pain Education      Exclude from Growth Chart    No data found.  Updated Vital Signs BP (!) 143/96 (BP Location: Right Arm)   Pulse 66   Temp 98.7 F (37.1 C) (Oral)   Resp 16   Ht 5\' 2"  (1.575 m)   Wt 108.9 kg   SpO2 96%   BMI 43.90 kg/m   Visual Acuity Right Eye Distance:   Left Eye Distance:   Bilateral Distance:     Right Eye Near:   Left Eye Near:    Bilateral Near:     Physical Exam Vitals and nursing note reviewed.  Constitutional:  Appearance: She is ill-appearing.  HENT:     Right Ear: Tympanic membrane normal.     Left Ear: Tympanic membrane normal.     Mouth/Throat:     Mouth: Mucous membranes are moist.     Pharynx: No oropharyngeal exudate or posterior oropharyngeal erythema.  Cardiovascular:     Rate and Rhythm: Normal rate and regular rhythm.  Pulmonary:     Effort: Pulmonary effort is normal.     Breath sounds: Wheezing present.  Neurological:     Mental Status: She is alert.      UC Treatments / Results  Labs (all labs ordered are listed, but only abnormal results are displayed) Labs Reviewed  POC COVID19/FLU A&B COMBO    EKG   Radiology No results found.  Procedures Procedures (including critical care time)  Medications Ordered in UC Medications - No data to display  Initial Impression / Assessment and Plan / UC Course  I have reviewed the triage vital signs and the nursing notes.  Pertinent labs & imaging results that were available during my care of the patient were reviewed by me and considered in my medical decision making (see chart for details).     1.  Viral URI with cough: COVID and flu test is negative Patient is advised to increase oral fluid intake Promethazine DM every 6 hours as needed for cough Return precautions given  2.  Mild intermittent asthma with acute exacerbation: Albuterol inhaler or nebulizer as needed for chest tightness and wheezing Prednisone 40 mg orally daily for 5 days No indication for chest x-ray Return precautions given. Final Clinical Impressions(s) / UC Diagnoses   Final diagnoses:  Viral URI with cough     Discharge Instructions      Please increase oral fluid intake Please take medications as prescribed Continue to use your inhaler and nebulizer as needed Will call you with recommendations if  labs are abnormal. If you have worsening symptoms please refer to return to urgent care to be reevaluated   ED Prescriptions     Medication Sig Dispense Auth. Provider   promethazine-dextromethorphan (PROMETHAZINE-DM) 6.25-15 MG/5ML syrup Take 5 mLs by mouth 4 (four) times daily as needed for cough. 118 mL Morgen Linebaugh, Britta Mccreedy, MD   predniSONE (DELTASONE) 20 MG tablet Take 2 tablets (40 mg total) by mouth daily for 5 days. 10 tablet Kadence Mimbs, Britta Mccreedy, MD      PDMP not reviewed this encounter.   Merrilee Jansky, MD 06/06/23 [phone]

## 2024-01-23 ENCOUNTER — Ambulatory Visit (HOSPITAL_COMMUNITY): Payer: Self-pay

## 2024-01-24 ENCOUNTER — Ambulatory Visit (HOSPITAL_COMMUNITY)
Admission: RE | Admit: 2024-01-24 | Discharge: 2024-01-24 | Disposition: A | Discharge status: Home / Self Care | Payer: Self-pay | Source: Ambulatory Visit | Attending: Family Medicine | Admitting: Family Medicine

## 2024-01-24 ENCOUNTER — Encounter (HOSPITAL_COMMUNITY): Payer: Self-pay

## 2024-01-24 VITALS — BP 137/91 | HR 85 | Temp 98.5°F | Resp 18

## 2024-01-24 DIAGNOSIS — B9689 Other specified bacterial agents as the cause of diseases classified elsewhere: Secondary | ICD-10-CM

## 2024-01-24 DIAGNOSIS — J019 Acute sinusitis, unspecified: Secondary | ICD-10-CM

## 2024-01-24 LAB — POCT RAPID STREP A (OFFICE): Rapid Strep A Screen: NEGATIVE

## 2024-01-24 MED ORDER — AZELASTINE HCL 0.1 % NA SOLN: 2.0000 | Freq: Two times a day (BID) | NASAL | 0 refills | Status: AC

## 2024-01-24 MED ORDER — AMOXICILLIN-POT CLAVULANATE 875-125 MG PO TABS: 1.0000 | ORAL_TABLET | Freq: Two times a day (BID) | ORAL | 0 refills | Status: AC

## 2024-01-24 NOTE — ED Triage Notes (Signed)
 Pt with c/o weakness, chills, and congestion for about 2 weeks. Patient states about 4 days ago she began having a sore throat. Patient states [place] the airport and just wanted to make sure with this measles stuff going around.

## 2024-01-24 NOTE — ED Provider Notes (Signed)
 [facility]    CSN: [id number] Arrival date & time: 01/24/24  1028      History   Chief Complaint Chief Complaint  Patient presents with   Sore Throat    HPI Mallory Henderson is a 58 y.o. female.   Patient presents with persistent cough, congestion, weakness, and chills for 2 weeks.  Patient states that she has seen her primary care provider previously for the symptoms and was prescribed a Z-Pak and prednisone  at that time.  Patient states that her symptoms have not gotten any better and have worsened some.  Patient endorses some headache and sinus pressure now.  Patient also states that she began to have a sore throat about 4 days ago.  Patient states that she was recently in an airport and is concerned about possible exposure to measles.  Patient denies any rash, fever, body aches, shortness of breath, chest pain, nausea, vomiting, and diarrhea.  The history is provided by the patient and medical records.  Sore Throat    Past Medical History:  Diagnosis Date   Cancer (HCC)    Chronic pain    Currently on Flexeril  and meloxicam .,  was given a prescription for by PCP.   COVID-19 virus infection Noticed shortness of breath, cough,   No shortness of breath, cough, chest tightness and fevers.  Malaise. => Not bothered by chronic pain   Hyperlipidemia    Hypertension    Morbid obesity with BMI of 45.0-49.9, adult (HCC)    BMI 45.7.-250pounds.   OSA (obstructive sleep apnea)    Not currently on CPAP.    Patient Active Problem List   Diagnosis Date Noted   Precordial chest pain 09/01/2020   Heart palpitations 09/01/2020   Morbid obesity (HCC) 12/21/2015   Hyperlipidemia 12/21/2015   Evaluate for Sleep apnea 12/21/2015   Obesity, unspecified 11/07/2013   Pure hypercholesterolemia 11/07/2013   Essential hypertension 11/07/2013    Past Surgical History:  Procedure Laterality Date   ABDOMINAL SURGERY     CESAREAN SECTION     GRADUATED EXERCISE TOLERANCE TEST  (ETT/GXT)   01/06/2016   Ex 8:01 min.; 85% MPHR @ 7 min - reached 170 bpm = 90% MPHR. 10.3 METS nonischemic test by EKG.   SHOULDER SURGERY     SPINE SURGERY     TRANSTHORACIC ECHOCARDIOGRAM  01/06/2016   Normal LV size and function.  EF 55 to 60%.  No R WMA.  Normal atrial sizes.  Normal pressures   TUBAL LIGATION      OB History   No obstetric history on file.      Home Medications    Prior to Admission medications   Medication Sig Start Date End Date Taking? Authorizing Provider  amoxicillin -clavulanate (AUGMENTIN ) 875-125 MG tablet Take 1 tablet by mouth every 12 (twelve) hours. 01/24/24  Yes Johnie, Graycen Degan A, NP  azelastine (ASTELIN) 0.1 % nasal spray Place 2 sprays into both nostrils 2 (two) times daily. Use in each nostril as directed 01/24/24  Yes Johnie Flaming A, NP  acetaminophen  (TYLENOL ) 325 MG tablet Take 650 mg by mouth every 6 (six) hours as needed.    [provider]  albuterol  (PROVENTIL  HFA;VENTOLIN  HFA) 108 (90 Base) MCG/ACT inhaler Inhale 1-2 puffs into the lungs every 6 (six) hours as needed for wheezing or shortness of breath. 11/13/17   Baxter Drivers, MD  amitriptyline (ELAVIL) 25 MG tablet Take 25 mg by mouth at bedtime as needed for sleep.    [provider]  apixaban (ELIQUIS) 2.5 MG TABS tablet Take 2.5 mg by mouth 2 (two) times daily. 10/11/21   [provider]  cloNIDine (CATAPRES) 0.1 MG tablet Take 0.1 mg by mouth 2 (two) times daily.    [provider]  fluticasone (FLONASE) 50 MCG/ACT nasal spray Place 1 spray into both nostrils daily as needed for allergies or rhinitis.    [provider]  furosemide (LASIX) 40 MG tablet Take 40 mg by mouth daily. 05/14/17   [provider]  lenalidomide (REVLIMID) 10 MG capsule Take 10 mg by mouth daily. 07/27/22   [provider]  loratadine  (CLARITIN ) 10 MG tablet Take 1 tablet (10 mg total) by mouth daily. Patient taking differently: Take 10 mg by mouth  daily as needed for allergies. 11/13/17   Baxter Drivers, MD  lovastatin (MEVACOR) 40 MG tablet Take 40 mg by mouth daily. 04/01/17   [provider]  metoprolol tartrate (LOPRESSOR) 100 MG tablet Take 100 mg by mouth 2 (two) times daily. 05/12/17   [provider]  Oxycodone  HCl 10 MG TABS Take 10 mg by mouth in the morning, at noon, in the evening, and at bedtime.    [provider]  promethazine -dextromethorphan (PROMETHAZINE -DM) 6.25-15 MG/5ML syrup Take 5 mLs by mouth 4 (four) times daily as needed for cough. 06/04/23   Lamptey, Aleene KIDD, MD  tizanidine  (ZANAFLEX ) 2 MG capsule Take 1 capsule (2 mg total) by mouth 3 (three) times daily. 02/02/23   Enedelia Dorna HERO, FNP  valsartan -hydrochlorothiazide  (DIOVAN -HCT) 80-12.5 MG tablet Take 1/2 tablet of (80/12.5 mg ) by mouth  In the morning Patient taking differently: 1 tablet. by mouth in the morning 09/01/20   Anner Alm ORN, MD    Family History Family History  Problem Relation Age of Onset   Hypertension Father    Heart disease Brother    Kidney disease Brother    Heart attack Brother    Diabetes Paternal Grandfather    Heart disease Paternal Grandfather    Healthy Mother    Stroke Maternal Grandmother     Social History Social History   Tobacco Use   Smoking status: Never   Smokeless tobacco: Never  Vaping Use   Vaping status: Never Used  Substance Use Topics   Alcohol use: Yes    Comment: wine socially   Drug use: No     Allergies   Hydrocodone, Tramadol, and Latex   Review of Systems Review of Systems  Per HPI  Physical Exam Triage Vital Signs ED Triage Vitals [01/24/24 1050]  Encounter Vitals Group     BP (!) 137/91     Girls Systolic BP Percentile      Girls Diastolic BP Percentile      Boys Systolic BP Percentile      Boys Diastolic BP Percentile      Pulse Rate 85     Resp 18     Temp 98.5 F (36.9 C)     Temp Source Oral     SpO2 96 %     Weight      Height      Head  Circumference      Peak Flow      Pain Score 7     Pain Loc      Pain Education      Exclude from Growth Chart    No data found.  Updated Vital Signs BP (!) 137/91 (BP Location: Right Arm)   Pulse 85  Temp 98.5 F (36.9 C) (Oral)   Resp 18   SpO2 96%   Visual Acuity Right Eye Distance:   Left Eye Distance:   Bilateral Distance:    Right Eye Near:   Left Eye Near:    Bilateral Near:     Physical Exam Vitals and nursing note reviewed.  Constitutional:      General: She is awake. She is not in acute distress.    Appearance: Normal appearance. She is well-developed and well-groomed. She is not ill-appearing.  HENT:     Right Ear: Tympanic membrane, ear canal and external ear normal.     Left Ear: Tympanic membrane, ear canal and external ear normal.     Nose: Congestion and rhinorrhea present.     Right Sinus: Frontal sinus tenderness present.     Left Sinus: Frontal sinus tenderness present.     Mouth/Throat:     Mouth: Mucous membranes are moist.     Pharynx: Posterior oropharyngeal erythema and postnasal drip present. No pharyngeal swelling or oropharyngeal exudate.     Tonsils: No tonsillar exudate.   Cardiovascular:     Rate and Rhythm: Normal rate and regular rhythm.  Pulmonary:     Effort: Pulmonary effort is normal.     Breath sounds: Normal breath sounds.   Skin:    General: Skin is warm and dry.   Neurological:     Mental Status: She is alert.   Psychiatric:        Behavior: Behavior is cooperative.      UC Treatments / Results  Labs (all labs ordered are listed, but only abnormal results are displayed) Labs Reviewed  POCT RAPID STREP A (OFFICE)    EKG   Radiology No results found.  Procedures Procedures (including critical care time)  Medications Ordered in UC Medications - No data to display  Initial Impression / Assessment and Plan / UC Course  I have reviewed the triage vital signs and the nursing notes.  Pertinent labs &  imaging results that were available during my care of the patient were reviewed by me and considered in my medical decision making (see chart for details).     Patient is overall well-appearing.  Vitals are stable.  Upon assessment congestion rhinorrhea present, bilateral frontal sinus tenderness noted.  Erythema and PND noted to pharynx.  Lungs clear bilaterally to auscultation.  Prescribed Augmentin  for bacterial sinusitis.  Prescribed azelastine for additional congestion relief.  Recommended taking Mucinex as needed for cough and congestion.  Discussed follow-up and return precautions. Final Clinical Impressions(s) / UC Diagnoses   Final diagnoses:  Acute bacterial sinusitis     Discharge Instructions      Start taking Augmentin  twice daily for 7 days for sinus infection. Use azelastine nasal spray twice daily for additional congestion relief. Otherwise you can take over-the-counter Mucinex (guaifenesin) as needed for cough and congestion. Follow-up with your primary care provider or return here as needed.   ED Prescriptions     Medication Sig Dispense Auth. Provider   amoxicillin -clavulanate (AUGMENTIN ) 875-125 MG tablet Take 1 tablet by mouth every 12 (twelve) hours. 14 tablet Johnie, Tahira Olivarez A, NP   azelastine (ASTELIN) 0.1 % nasal spray Place 2 sprays into both nostrils 2 (two) times daily. Use in each nostril as directed 30 mL Johnie Flaming A, NP      PDMP not reviewed this encounter.   Johnie Flaming A, NP 01/24/24 1137

## 2024-01-24 NOTE — Discharge Instructions (Signed)
 Start taking Augmentin  twice daily for 7 days for sinus infection. Use azelastine nasal spray twice daily for additional congestion relief. Otherwise you can take over-the-counter Mucinex (guaifenesin) as needed for cough and congestion. Follow-up with your primary care provider or return here as needed.

## 2024-03-24 NOTE — Progress Notes (Signed)
 [facility]ULT [facility]  CLINICAL SOCIAL WORK TELEPHONE NOTE  IDENTIFYING STATEMENT:  Mallory Henderson is a 58 y.o. female with a date of birth of [birth year] from [place], [place] with the diagnosis of plasmacytoma/Multiple Myeloma.   REASON FOR CSW CONSULT: Financial limitations.  IMPRESSION / INTERVENTION:  Clinical Child psychotherapist (CSW) researched all 3 organizations that the patient sent to CSW for assistance. CSW attempted several times to apply for chemo divas but was unable to submit application online. CSW emailed the organization asking for assistance. CSW notified patient of the attempts to apply. CSW encouraged that patient to complete the [employer] grant as this should be done by patient. [facility] gas card program the patients must live at least 100 miles away from the medical facility for eligibility. CSW reached out to [employer] and forwarded to him pt's overdue Soil scientist with [employer]. CSW updated patient on efforts made to assist her with her financial barriers.    PLAN:  CSW is awaiting confirmation that [employer] can help patient with her utility bill. Patient knows how to reach this CSW if further psychosocial support/assistance is needed.    Heather Macario Colla, MSW, LCSW, OSW-C Clinical Social Worker Adult Blood & Marrow Transplant [facility] [phone] (office)  [phone] (pager)

## 2024-03-27 NOTE — Progress Notes (Signed)
 [facility]  CLINICAL SOCIAL WORK TELEPHONE NOTE  IDENTIFYING STATEMENT:  Mallory Henderson is a 58 y.o. female with a date of birth of [birth year] from [place], [place] with the diagnosis of plasmacytoma/Multiple Myeloma.   REASON FOR CSW CONSULT: Financial strain/overdue Soil scientist  IMPRESSION / INTERVENTION:  Visual merchandiser (CSW) received confirmation email from [employer] with [employer] that he was able to pay pt's overdue electric bill of [phone]. CSW notified patient that this was paid.   PLAN:  No further CSW intervention needed at this time. Patient knows how to reach this CSW if further psychosocial concerns arise.    Heather Macario Colla, MSW, LCSW, OSW-C Clinical Social Worker Adult Blood & Marrow Transplant [facility] [phone] (office)  [phone] (pager)

## 2024-03-28 ENCOUNTER — Other Ambulatory Visit (HOSPITAL_COMMUNITY): Payer: Self-pay | Admitting: Nurse Practitioner

## 2024-03-28 ENCOUNTER — Ambulatory Visit (HOSPITAL_COMMUNITY)
Admission: RE | Admit: 2024-03-28 | Discharge: 2024-03-28 | Disposition: A | Discharge status: Home / Self Care | Source: Ambulatory Visit | Attending: Nurse Practitioner | Admitting: Nurse Practitioner

## 2024-03-28 DIAGNOSIS — M25531 Pain in right wrist: Secondary | ICD-10-CM

## 2024-03-28 DIAGNOSIS — M79641 Pain in right hand: Secondary | ICD-10-CM | POA: Insufficient documentation

## 2024-04-16 ENCOUNTER — Ambulatory Visit (HOSPITAL_COMMUNITY): Payer: Self-pay

## 2024-04-17 ENCOUNTER — Encounter (HOSPITAL_COMMUNITY): Payer: Self-pay | Admitting: Emergency Medicine

## 2024-04-17 ENCOUNTER — Ambulatory Visit (HOSPITAL_COMMUNITY)
Admission: EM | Admit: 2024-04-17 | Discharge: 2024-04-17 | Disposition: A | Discharge status: Home / Self Care | Attending: Family Medicine | Admitting: Family Medicine

## 2024-04-17 ENCOUNTER — Ambulatory Visit (INDEPENDENT_AMBULATORY_CARE_PROVIDER_SITE_OTHER)

## 2024-04-17 DIAGNOSIS — C9000 Multiple myeloma not having achieved remission: Secondary | ICD-10-CM | POA: Diagnosis not present

## 2024-04-17 DIAGNOSIS — B9789 Other viral agents as the cause of diseases classified elsewhere: Secondary | ICD-10-CM | POA: Insufficient documentation

## 2024-04-17 DIAGNOSIS — R0602 Shortness of breath: Secondary | ICD-10-CM | POA: Diagnosis present

## 2024-04-17 DIAGNOSIS — R051 Acute cough: Secondary | ICD-10-CM | POA: Insufficient documentation

## 2024-04-17 DIAGNOSIS — R0989 Other specified symptoms and signs involving the circulatory and respiratory systems: Secondary | ICD-10-CM | POA: Diagnosis present

## 2024-04-17 DIAGNOSIS — J988 Other specified respiratory disorders: Secondary | ICD-10-CM | POA: Insufficient documentation

## 2024-04-17 LAB — RESPIRATORY PANEL BY PCR
Adenovirus: NOT DETECTED
Bordetella Parapertussis: NOT DETECTED
Bordetella pertussis: NOT DETECTED
Chlamydophila pneumoniae: NOT DETECTED
Coronavirus 229E: NOT DETECTED
Coronavirus HKU1: NOT DETECTED
Coronavirus NL63: NOT DETECTED
Coronavirus OC43: NOT DETECTED
Influenza A: NOT DETECTED
Influenza B: NOT DETECTED
Metapneumovirus: NOT DETECTED
Mycoplasma pneumoniae: NOT DETECTED
Parainfluenza Virus 1: NOT DETECTED
Parainfluenza Virus 2: NOT DETECTED
Parainfluenza Virus 3: NOT DETECTED
Parainfluenza Virus 4: DETECTED — AB
Respiratory Syncytial Virus: NOT DETECTED
Rhinovirus / Enterovirus: DETECTED — AB

## 2024-04-17 LAB — POC COVID19/FLU A&B COMBO
Covid Antigen, POC: NEGATIVE
Influenza A Antigen, POC: NEGATIVE
Influenza B Antigen, POC: NEGATIVE

## 2024-04-17 NOTE — ED Provider Notes (Addendum)
 [facility]    CSN: [id number] Arrival date & time: 04/17/24  0801      History   Chief Complaint Chief Complaint  Patient presents with   Headache   Nasal Congestion    HPI Mallory Henderson is a 58 y.o. female.   Patient presents with nasal congestion, productive cough, headache, chest congestion, and intermittent chest discomfort and shortness of breath over the last week.  Patient states that she has also had some bodyaches and chills.  Patient states that she believes she initially had a fever that has since broke.  Patient states that she has been taking over-the-counter medication with some relief of her symptoms.  Patient has a history of multiple myeloma.  Patient states that she called her medical team at [facility] and they recommended that she have a chest x-ray and respiratory panel.  Patient denies any known sick contacts.  The history is provided by the patient and medical records.  Headache   Past Medical History:  Diagnosis Date   Cancer (HCC)    Chronic pain    Currently on Flexeril  and meloxicam .,  was given a prescription for by PCP.   COVID-19 virus infection Noticed shortness of breath, cough,   No shortness of breath, cough, chest tightness and fevers.  Malaise. => Not bothered by chronic pain   Hyperlipidemia    Hypertension    Morbid obesity with BMI of 45.0-49.9, adult (HCC)    BMI 45.7.-250pounds.   OSA (obstructive sleep apnea)    Not currently on CPAP.    Patient Active Problem List   Diagnosis Date Noted   Precordial chest pain 09/01/2020   Heart palpitations 09/01/2020   Morbid obesity (HCC) 12/21/2015   Hyperlipidemia 12/21/2015   Evaluate for Sleep apnea 12/21/2015   Obesity, unspecified 11/07/2013   Pure hypercholesterolemia 11/07/2013   Essential hypertension 11/07/2013    Past Surgical History:  Procedure Laterality Date   ABDOMINAL SURGERY     CESAREAN SECTION     GRADUATED EXERCISE TOLERANCE TEST (ETT/GXT)   01/06/2016    Ex 8:01 min.; 85% MPHR @ 7 min - reached 170 bpm = 90% MPHR. 10.3 METS nonischemic test by EKG.   SHOULDER SURGERY     SPINE SURGERY     TRANSTHORACIC ECHOCARDIOGRAM  01/06/2016   Normal LV size and function.  EF 55 to 60%.  No R WMA.  Normal atrial sizes.  Normal pressures   TUBAL LIGATION      OB History   No obstetric history on file.      Home Medications    Prior to Admission medications   Medication Sig Start Date End Date Taking? Authorizing Provider  acetaminophen  (TYLENOL ) 325 MG tablet Take 650 mg by mouth every 6 (six) hours as needed.    [provider]  albuterol  (PROVENTIL  HFA;VENTOLIN  HFA) 108 (90 Base) MCG/ACT inhaler Inhale 1-2 puffs into the lungs every 6 (six) hours as needed for wheezing or shortness of breath. 11/13/17   Baxter Drivers, MD  amitriptyline (ELAVIL) 25 MG tablet Take 25 mg by mouth at bedtime as needed for sleep.    [provider]  amoxicillin -clavulanate (AUGMENTIN ) 875-125 MG tablet Take 1 tablet by mouth every 12 (twelve) hours. 01/24/24   Johnie Flaming A, NP  apixaban (ELIQUIS) 2.5 MG TABS tablet Take 2.5 mg by mouth 2 (two) times daily. 10/11/21   [provider]  azelastine  (ASTELIN ) 0.1 % nasal spray Place 2 sprays into both nostrils 2 (two)  times daily. Use in each nostril as directed 01/24/24   Johnie Flaming A, NP  cloNIDine (CATAPRES) 0.1 MG tablet Take 0.1 mg by mouth 2 (two) times daily.    [provider]  fluticasone (FLONASE) 50 MCG/ACT nasal spray Place 1 spray into both nostrils daily as needed for allergies or rhinitis.    [provider]  furosemide (LASIX) 40 MG tablet Take 40 mg by mouth daily. 05/14/17   [provider]  lenalidomide (REVLIMID) 10 MG capsule Take 10 mg by mouth daily. 07/27/22   [provider]  loratadine  (CLARITIN ) 10 MG tablet Take 1 tablet (10 mg total) by mouth daily. Patient taking differently: Take 10 mg by mouth daily as needed for  allergies. 11/13/17   Baxter Drivers, MD  lovastatin (MEVACOR) 40 MG tablet Take 40 mg by mouth daily. 04/01/17   [provider]  metoprolol tartrate (LOPRESSOR) 100 MG tablet Take 100 mg by mouth 2 (two) times daily. 05/12/17   [provider]  Oxycodone  HCl 10 MG TABS Take 10 mg by mouth in the morning, at noon, in the evening, and at bedtime.    [provider]  promethazine -dextromethorphan (PROMETHAZINE -DM) 6.25-15 MG/5ML syrup Take 5 mLs by mouth 4 (four) times daily as needed for cough. 06/04/23   LampteyAleene KIDD, MD  tizanidine  (ZANAFLEX ) 2 MG capsule Take 1 capsule (2 mg total) by mouth 3 (three) times daily. 02/02/23   Enedelia Dorna HERO, FNP  valsartan -hydrochlorothiazide  (DIOVAN -HCT) 80-12.5 MG tablet Take 1/2 tablet of (80/12.5 mg ) by mouth  In the morning Patient taking differently: 1 tablet. by mouth in the morning 09/01/20   Anner Alm ORN, MD    Family History Family History  Problem Relation Age of Onset   Hypertension Father    Heart disease Brother    Kidney disease Brother    Heart attack Brother    Diabetes Paternal Grandfather    Heart disease Paternal Grandfather    Healthy Mother    Stroke Maternal Grandmother     Social History Social History   Tobacco Use   Smoking status: Never   Smokeless tobacco: Never  Vaping Use   Vaping status: Never Used  Substance Use Topics   Alcohol use: Yes    Comment: wine socially   Drug use: No     Allergies   Hydrocodone, Tramadol, and Latex   Review of Systems Review of Systems  Neurological:  Positive for headaches.   Per HPI  Physical Exam Triage Vital Signs ED Triage Vitals  Encounter Vitals Group     BP 04/17/24 0824 128/83     Girls Systolic BP Percentile --      Girls Diastolic BP Percentile --      Boys Systolic BP Percentile --      Boys Diastolic BP Percentile --      Pulse Rate 04/17/24 0824 78     Resp 04/17/24 0824 19     Temp 04/17/24 0824 98.9 F (37.2 C)      Temp Source 04/17/24 0824 Oral     SpO2 04/17/24 0824 97 %     Weight --      Height --      Head Circumference --      Peak Flow --      Pain Score 04/17/24 0823 7     Pain Loc --      Pain Education --      Exclude from Growth Chart --  No data found.  Updated Vital Signs BP 128/83 (BP Location: Right Arm)   Pulse 78   Temp 98.9 F (37.2 C) (Oral)   Resp 19   SpO2 97%   Visual Acuity Right Eye Distance:   Left Eye Distance:   Bilateral Distance:    Right Eye Near:   Left Eye Near:    Bilateral Near:     Physical Exam Vitals and nursing note reviewed.  Constitutional:      General: She is awake. She is not in acute distress.    Appearance: Normal appearance. She is well-developed and well-groomed. She is not ill-appearing.  HENT:     Right Ear: Tympanic membrane, ear canal and external ear normal.     Left Ear: Tympanic membrane, ear canal and external ear normal.     Nose: Congestion and rhinorrhea present.     Mouth/Throat:     Mouth: Mucous membranes are moist.     Pharynx: Posterior oropharyngeal erythema and postnasal drip present. No oropharyngeal exudate.  Cardiovascular:     Rate and Rhythm: Normal rate and regular rhythm.  Pulmonary:     Effort: Pulmonary effort is normal.     Breath sounds: Normal breath sounds.  Skin:    General: Skin is warm and dry.  Neurological:     Mental Status: She is alert.  Psychiatric:        Behavior: Behavior is cooperative.      UC Treatments / Results  Labs (all labs ordered are listed, but only abnormal results are displayed) Labs Reviewed  RESPIRATORY PANEL BY PCR  POC COVID19/FLU A&B COMBO    EKG   Radiology DG Chest 2 View Result Date: 04/17/2024 CLINICAL DATA:  Productive cough for 1 week EXAM: CHEST - 2 VIEW COMPARISON:  None Available. FINDINGS: Posterior thoracic fusion. Normal mediastinum and cardiac silhouette. Normal pulmonary vasculature. No evidence of effusion, infiltrate, or  pneumothorax. No acute bony abnormality. IMPRESSION: No acute cardiopulmonary process. Electronically Signed   By: Jackquline Boxer M.D.   On: 04/17/2024 09:14    Procedures Procedures (including critical care time)  Medications Ordered in UC Medications - No data to display  Initial Impression / Assessment and Plan / UC Course  I have reviewed the triage vital signs and the nursing notes.  Pertinent labs & imaging results that were available during my care of the patient were reviewed by me and considered in my medical decision making (see chart for details).     Patient is overall well-appearing.  Vitals are stable.  Congestion and rhinorrhea present, erythema and PND noted to posterior oropharynx.  Lungs clear bilateral auscultation.  Chest x-ray ordered.  Based on my interpretation there is no active cardiopulmonary disease.  Radiology report confirms this.  Rapid COVID and flu were both negative.  Ordered respiratory panel per patient request.  Discussed with patient that symptoms are likely viral in nature and therefore do not require an antibiotic at this time.  Discussed systemic treatment for symptoms.  Discussed follow-up and return precautions. Final Clinical Impressions(s) / UC Diagnoses   Final diagnoses:  Acute cough  Viral respiratory illness     Discharge Instructions      Your chest x-ray was negative for any pneumonia.  Your rapid COVID and flu testing were both negative today.  We have sent off a respiratory panel to check for other viruses related to your symptoms.  If any results are positive someone will call to let you know. As  discussed I believe her symptoms are likely related to a viral respiratory illness.  This will require symptomatic treatment at this time. You can take Mucinex (guaifenesin) as needed for cough and congestion. You can also continue using your nasal spray to help with congestion. Take 500 to 1000 mg of Tylenol  every 6-8 hours as needed  for headache or any pain.  Do not exceed 4000 mg in 1 day. Follow-up with your primary care provider or return here as needed.     ED Prescriptions   None    PDMP not reviewed this encounter.   Johnie Rumaldo LABOR, NP 04/17/24 0946    Johnie Rumaldo LABOR, NP 04/17/24 [id number]

## 2024-04-17 NOTE — ED Triage Notes (Signed)
 Pt reports for a week having sinus congestion, cough that is productive and headache. Taking OTC medications for symptoms. Is cancer patient at [facility] and called them who recommended chest xray and resp. Panel.

## 2024-04-17 NOTE — Discharge Instructions (Signed)
 Your chest x-ray was negative for any pneumonia.  Your rapid COVID and flu testing were both negative today.  We have sent off a respiratory panel to check for other viruses related to your symptoms.  If any results are positive someone will call to let you know. As discussed I believe her symptoms are likely related to a viral respiratory illness.  This will require symptomatic treatment at this time. You can take Mucinex (guaifenesin) as needed for cough and congestion. You can also continue using your nasal spray to help with congestion. Take 500 to 1000 mg of Tylenol  every 6-8 hours as needed for headache or any pain.  Do not exceed 4000 mg in 1 day. Follow-up with your primary care provider or return here as needed.

## 2024-04-20 ENCOUNTER — Telehealth (HOSPITAL_COMMUNITY): Payer: Self-pay | Admitting: *Deleted

## 2024-04-20 MED ORDER — BENZONATATE 100 MG PO CAPS: 100.0000 mg | ORAL_CAPSULE | Freq: Three times a day (TID) | ORAL | 0 refills | Status: AC

## 2024-04-20 MED ORDER — FLUTICASONE PROPIONATE 50 MCG/ACT NA SUSP: 1.0000 | Freq: Every day | NASAL | 0 refills | Status: AC

## 2024-04-20 NOTE — Telephone Encounter (Signed)
 Pt states that she is already on Flonase  and tessalon  pearls do not work for her. She states she will call her doctors at [facility] since no one here at urgent care wants to help her when she is sick. She then proceeds to yell at me. I advised her we would be happy to see her in office if she feels she is no better, she then continued to yell. I thanked her for called and disconnected the call.

## 2024-04-20 NOTE — Telephone Encounter (Signed)
 Pt states she seen her resp panel on mychart and the OTC meds aren't working she would like something called in to help she can't stop coughing and has a runny nose. I have advised sx care but she states she is already doing that without relief.

## 2024-04-20 NOTE — Telephone Encounter (Signed)
 I have sent in Flonase  nasal spray to help with her runny nose and congestion.  I have also sent in Tessalon  to help with her cough.  She can continue taking over-the-counter medication with these as needed.  Please let her know her that antibiotics are not an appropriate treatment for viruses and the virus needs to pass on its own but can be helped with this symptomatic treatment.

## 2024-04-21 ENCOUNTER — Ambulatory Visit (HOSPITAL_COMMUNITY): Payer: Self-pay

## 2024-05-26 ENCOUNTER — Other Ambulatory Visit: Payer: Self-pay | Admitting: Nurse Practitioner

## 2024-05-26 DIAGNOSIS — Z1231 Encounter for screening mammogram for malignant neoplasm of breast: Secondary | ICD-10-CM
